# Patient Record
Sex: Female | Born: 1955 | Race: White | Hispanic: No | Marital: Married | Smoking: Current every day smoker
Health system: Southern US, Community
[De-identification: ages and names within clinical notes are randomized; demographics above are authoritative.]

## PROBLEM LIST (undated history)

## (undated) DIAGNOSIS — S335XXA Sprain of ligaments of lumbar spine, initial encounter: Secondary | ICD-10-CM

## (undated) DIAGNOSIS — R202 Paresthesia of skin: Secondary | ICD-10-CM

## (undated) DIAGNOSIS — K56609 Unspecified intestinal obstruction, unspecified as to partial versus complete obstruction: Secondary | ICD-10-CM

## (undated) DIAGNOSIS — F329 Major depressive disorder, single episode, unspecified: Secondary | ICD-10-CM

## (undated) DIAGNOSIS — E079 Disorder of thyroid, unspecified: Secondary | ICD-10-CM

## (undated) DIAGNOSIS — M797 Fibromyalgia: Secondary | ICD-10-CM

## (undated) DIAGNOSIS — F32A Depression, unspecified: Secondary | ICD-10-CM

## (undated) DIAGNOSIS — Z9981 Dependence on supplemental oxygen: Secondary | ICD-10-CM

## (undated) DIAGNOSIS — K219 Gastro-esophageal reflux disease without esophagitis: Secondary | ICD-10-CM

## (undated) DIAGNOSIS — E119 Type 2 diabetes mellitus without complications: Secondary | ICD-10-CM

## (undated) DIAGNOSIS — K5903 Drug induced constipation: Secondary | ICD-10-CM

## (undated) DIAGNOSIS — Z87442 Personal history of urinary calculi: Secondary | ICD-10-CM

## (undated) DIAGNOSIS — Z8659 Personal history of other mental and behavioral disorders: Secondary | ICD-10-CM

## (undated) DIAGNOSIS — J189 Pneumonia, unspecified organism: Secondary | ICD-10-CM

## (undated) DIAGNOSIS — E039 Hypothyroidism, unspecified: Secondary | ICD-10-CM

## (undated) DIAGNOSIS — G629 Polyneuropathy, unspecified: Secondary | ICD-10-CM

## (undated) DIAGNOSIS — I10 Essential (primary) hypertension: Secondary | ICD-10-CM

## (undated) DIAGNOSIS — G43909 Migraine, unspecified, not intractable, without status migrainosus: Secondary | ICD-10-CM

## (undated) DIAGNOSIS — L309 Dermatitis, unspecified: Secondary | ICD-10-CM

## (undated) DIAGNOSIS — K760 Fatty (change of) liver, not elsewhere classified: Secondary | ICD-10-CM

## (undated) DIAGNOSIS — K5792 Diverticulitis of intestine, part unspecified, without perforation or abscess without bleeding: Secondary | ICD-10-CM

## (undated) DIAGNOSIS — J45909 Unspecified asthma, uncomplicated: Secondary | ICD-10-CM

## (undated) DIAGNOSIS — J449 Chronic obstructive pulmonary disease, unspecified: Secondary | ICD-10-CM

## (undated) DIAGNOSIS — F1120 Opioid dependence, uncomplicated: Secondary | ICD-10-CM

## (undated) DIAGNOSIS — E785 Hyperlipidemia, unspecified: Secondary | ICD-10-CM

## (undated) DIAGNOSIS — F419 Anxiety disorder, unspecified: Secondary | ICD-10-CM

## (undated) HISTORY — PX: OTHER SURGICAL HISTORY: SHX169

## (undated) HISTORY — DX: Disorder of thyroid, unspecified: E07.9

## (undated) HISTORY — DX: Diverticulitis of intestine, part unspecified, without perforation or abscess without bleeding: K57.92

## (undated) HISTORY — DX: Type 2 diabetes mellitus without complications: E11.9

## (undated) HISTORY — DX: Unspecified asthma, uncomplicated: J45.909

## (undated) HISTORY — DX: Hyperlipidemia, unspecified: E78.5

## (undated) HISTORY — DX: Morbid (severe) obesity due to excess calories: E66.01

## (undated) HISTORY — DX: Gastro-esophageal reflux disease without esophagitis: K21.9

## (undated) HISTORY — DX: Fibromyalgia: M79.7

## (undated) HISTORY — DX: Sprain of ligaments of lumbar spine, initial encounter: S33.5XXA

## (undated) HISTORY — PX: ABDOMINAL HYSTERECTOMY: SHX81

## (undated) HISTORY — DX: Chronic obstructive pulmonary disease, unspecified: J44.9

## (undated) SURGERY — COLONOSCOPY
Anesthesia: Monitor Anesthesia Care

---
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
## 1998-10-09 ENCOUNTER — Emergency Department (HOSPITAL_COMMUNITY): Admission: EM | Admit: 1998-10-09 | Discharge: 1998-10-09 | Payer: Self-pay | Admitting: Emergency Medicine

## 1998-10-09 ENCOUNTER — Encounter: Payer: Self-pay | Admitting: Emergency Medicine

## 1998-10-26 ENCOUNTER — Encounter: Payer: Self-pay | Admitting: Emergency Medicine

## 1998-10-26 ENCOUNTER — Emergency Department (HOSPITAL_COMMUNITY): Admission: EM | Admit: 1998-10-26 | Discharge: 1998-10-26 | Payer: Self-pay | Admitting: Emergency Medicine

## 1998-10-29 ENCOUNTER — Other Ambulatory Visit: Admission: RE | Admit: 1998-10-29 | Discharge: 1998-10-29 | Payer: Self-pay | Admitting: Obstetrics & Gynecology

## 1998-11-05 ENCOUNTER — Emergency Department (HOSPITAL_COMMUNITY): Admission: EM | Admit: 1998-11-05 | Discharge: 1998-11-05 | Payer: Self-pay | Admitting: Emergency Medicine

## 1998-11-06 ENCOUNTER — Emergency Department (HOSPITAL_COMMUNITY): Admission: EM | Admit: 1998-11-06 | Discharge: 1998-11-06 | Payer: Self-pay | Admitting: Emergency Medicine

## 1999-02-09 ENCOUNTER — Emergency Department (HOSPITAL_COMMUNITY): Admission: EM | Admit: 1999-02-09 | Discharge: 1999-02-10 | Payer: Self-pay | Admitting: Emergency Medicine

## 1999-02-14 ENCOUNTER — Emergency Department (HOSPITAL_COMMUNITY): Admission: EM | Admit: 1999-02-14 | Discharge: 1999-02-14 | Payer: Self-pay | Admitting: Emergency Medicine

## 1999-05-02 ENCOUNTER — Encounter: Payer: Self-pay | Admitting: Emergency Medicine

## 1999-05-02 ENCOUNTER — Emergency Department (HOSPITAL_COMMUNITY): Admission: EM | Admit: 1999-05-02 | Discharge: 1999-05-02 | Payer: Self-pay | Admitting: Emergency Medicine

## 1999-06-08 ENCOUNTER — Encounter: Payer: Self-pay | Admitting: Emergency Medicine

## 1999-06-08 ENCOUNTER — Emergency Department (HOSPITAL_COMMUNITY): Admission: EM | Admit: 1999-06-08 | Discharge: 1999-06-08 | Payer: Self-pay | Admitting: Emergency Medicine

## 1999-08-19 ENCOUNTER — Emergency Department (HOSPITAL_COMMUNITY): Admission: EM | Admit: 1999-08-19 | Discharge: 1999-08-19 | Payer: Self-pay | Admitting: *Deleted

## 1999-10-09 ENCOUNTER — Emergency Department (HOSPITAL_COMMUNITY): Admission: EM | Admit: 1999-10-09 | Discharge: 1999-10-09 | Payer: Self-pay

## 1999-11-20 ENCOUNTER — Other Ambulatory Visit: Admission: RE | Admit: 1999-11-20 | Discharge: 1999-11-20 | Payer: Self-pay | Admitting: Obstetrics and Gynecology

## 2000-02-16 ENCOUNTER — Emergency Department (HOSPITAL_COMMUNITY): Admission: EM | Admit: 2000-02-16 | Discharge: 2000-02-16 | Payer: Self-pay

## 2000-10-08 ENCOUNTER — Emergency Department (HOSPITAL_COMMUNITY): Admission: EM | Admit: 2000-10-08 | Discharge: 2000-10-09 | Payer: Self-pay | Admitting: Emergency Medicine

## 2000-12-16 ENCOUNTER — Inpatient Hospital Stay (HOSPITAL_COMMUNITY): Admission: RE | Admit: 2000-12-16 | Discharge: 2000-12-20 | Payer: Self-pay | Admitting: Obstetrics and Gynecology

## 2000-12-17 ENCOUNTER — Encounter: Payer: Self-pay | Admitting: Obstetrics and Gynecology

## 2000-12-17 ENCOUNTER — Encounter: Payer: Self-pay | Admitting: Pulmonary Disease

## 2000-12-18 ENCOUNTER — Encounter: Payer: Self-pay | Admitting: Pulmonary Disease

## 2000-12-19 ENCOUNTER — Encounter: Payer: Self-pay | Admitting: Critical Care Medicine

## 2000-12-26 ENCOUNTER — Emergency Department (HOSPITAL_COMMUNITY): Admission: EM | Admit: 2000-12-26 | Discharge: 2000-12-26 | Payer: Self-pay | Admitting: *Deleted

## 2001-05-23 ENCOUNTER — Encounter: Payer: Self-pay | Admitting: Emergency Medicine

## 2001-05-23 ENCOUNTER — Emergency Department (HOSPITAL_COMMUNITY): Admission: EM | Admit: 2001-05-23 | Discharge: 2001-05-23 | Payer: Self-pay | Admitting: Emergency Medicine

## 2002-09-11 ENCOUNTER — Inpatient Hospital Stay (HOSPITAL_COMMUNITY): Admission: EM | Admit: 2002-09-11 | Discharge: 2002-09-13 | Payer: Self-pay | Admitting: Emergency Medicine

## 2002-09-11 ENCOUNTER — Encounter: Payer: Self-pay | Admitting: Emergency Medicine

## 2002-09-12 ENCOUNTER — Encounter: Payer: Self-pay | Admitting: Internal Medicine

## 2002-11-02 ENCOUNTER — Encounter: Payer: Self-pay | Admitting: Gastroenterology

## 2002-11-02 ENCOUNTER — Ambulatory Visit (HOSPITAL_COMMUNITY): Admission: RE | Admit: 2002-11-02 | Discharge: 2002-11-02 | Payer: Self-pay | Admitting: Gastroenterology

## 2002-11-09 ENCOUNTER — Ambulatory Visit (HOSPITAL_COMMUNITY): Admission: RE | Admit: 2002-11-09 | Discharge: 2002-11-09 | Payer: Self-pay | Admitting: Gastroenterology

## 2003-01-19 ENCOUNTER — Encounter: Payer: Self-pay | Admitting: Emergency Medicine

## 2003-01-19 ENCOUNTER — Emergency Department (HOSPITAL_COMMUNITY): Admission: EM | Admit: 2003-01-19 | Discharge: 2003-01-19 | Payer: Self-pay | Admitting: Emergency Medicine

## 2003-03-18 ENCOUNTER — Emergency Department (HOSPITAL_COMMUNITY): Admission: EM | Admit: 2003-03-18 | Discharge: 2003-03-18 | Payer: Self-pay | Admitting: Emergency Medicine

## 2003-09-06 ENCOUNTER — Emergency Department (HOSPITAL_COMMUNITY): Admission: EM | Admit: 2003-09-06 | Discharge: 2003-09-06 | Payer: Self-pay | Admitting: Emergency Medicine

## 2003-09-06 ENCOUNTER — Encounter: Payer: Self-pay | Admitting: Emergency Medicine

## 2004-03-21 ENCOUNTER — Emergency Department (HOSPITAL_COMMUNITY): Admission: EM | Admit: 2004-03-21 | Discharge: 2004-03-21 | Payer: Self-pay | Admitting: Emergency Medicine

## 2004-03-22 ENCOUNTER — Ambulatory Visit (HOSPITAL_COMMUNITY): Admission: RE | Admit: 2004-03-22 | Discharge: 2004-03-22 | Payer: Self-pay | Admitting: Emergency Medicine

## 2004-05-26 ENCOUNTER — Emergency Department (HOSPITAL_COMMUNITY): Admission: EM | Admit: 2004-05-26 | Discharge: 2004-05-27 | Payer: Self-pay | Admitting: Emergency Medicine

## 2004-09-01 ENCOUNTER — Emergency Department (HOSPITAL_COMMUNITY): Admission: EM | Admit: 2004-09-01 | Discharge: 2004-09-02 | Payer: Self-pay | Admitting: Emergency Medicine

## 2004-09-11 ENCOUNTER — Encounter: Admission: RE | Admit: 2004-09-11 | Discharge: 2004-09-11 | Payer: Self-pay | Admitting: Neurosurgery

## 2004-12-15 ENCOUNTER — Emergency Department (HOSPITAL_COMMUNITY): Admission: EM | Admit: 2004-12-15 | Discharge: 2004-12-16 | Payer: Self-pay | Admitting: Emergency Medicine

## 2004-12-18 ENCOUNTER — Emergency Department (HOSPITAL_COMMUNITY): Admission: EM | Admit: 2004-12-18 | Discharge: 2004-12-18 | Payer: Self-pay | Admitting: Emergency Medicine

## 2004-12-30 ENCOUNTER — Ambulatory Visit (HOSPITAL_COMMUNITY): Admission: RE | Admit: 2004-12-30 | Discharge: 2004-12-30 | Payer: Self-pay | Admitting: Gastroenterology

## 2005-01-17 ENCOUNTER — Ambulatory Visit: Payer: Self-pay | Admitting: General Surgery

## 2005-10-03 ENCOUNTER — Encounter: Admission: RE | Admit: 2005-10-03 | Discharge: 2005-10-03 | Payer: Self-pay | Admitting: Neurology

## 2007-06-29 ENCOUNTER — Emergency Department (HOSPITAL_COMMUNITY): Admission: EM | Admit: 2007-06-29 | Discharge: 2007-06-29 | Payer: Self-pay | Admitting: Emergency Medicine

## 2008-04-07 ENCOUNTER — Emergency Department (HOSPITAL_COMMUNITY): Admission: EM | Admit: 2008-04-07 | Discharge: 2008-04-07 | Payer: Self-pay | Admitting: Emergency Medicine

## 2009-01-22 ENCOUNTER — Emergency Department (HOSPITAL_COMMUNITY): Admission: EM | Admit: 2009-01-22 | Discharge: 2009-01-22 | Payer: Self-pay | Admitting: Emergency Medicine

## 2009-02-12 ENCOUNTER — Ambulatory Visit: Payer: Self-pay | Admitting: Unknown Physician Specialty

## 2009-03-08 ENCOUNTER — Ambulatory Visit: Payer: Self-pay | Admitting: Unknown Physician Specialty

## 2009-06-23 ENCOUNTER — Encounter: Admission: RE | Admit: 2009-06-23 | Discharge: 2009-06-23 | Payer: Self-pay | Admitting: Chiropractor

## 2009-06-27 ENCOUNTER — Encounter: Admission: RE | Admit: 2009-06-27 | Discharge: 2009-06-27 | Payer: Self-pay | Admitting: Anesthesiology

## 2009-12-04 ENCOUNTER — Ambulatory Visit: Payer: Self-pay | Admitting: Internal Medicine

## 2009-12-04 ENCOUNTER — Inpatient Hospital Stay (HOSPITAL_COMMUNITY): Admission: EM | Admit: 2009-12-04 | Discharge: 2009-12-07 | Payer: Self-pay | Admitting: Emergency Medicine

## 2009-12-19 ENCOUNTER — Encounter: Admission: RE | Admit: 2009-12-19 | Discharge: 2009-12-19 | Payer: Self-pay | Admitting: Gastroenterology

## 2011-02-11 ENCOUNTER — Other Ambulatory Visit (HOSPITAL_COMMUNITY): Payer: Self-pay | Admitting: Gastroenterology

## 2011-02-11 DIAGNOSIS — R1084 Generalized abdominal pain: Secondary | ICD-10-CM

## 2011-02-16 LAB — CBC
HCT: 36.2 % (ref 36.0–46.0)
HCT: 47.1 % — ABNORMAL HIGH (ref 36.0–46.0)
Hemoglobin: 15.7 g/dL — ABNORMAL HIGH (ref 12.0–15.0)
MCHC: 33.3 g/dL (ref 30.0–36.0)
MCHC: 33.4 g/dL (ref 30.0–36.0)
MCHC: 33.7 g/dL (ref 30.0–36.0)
MCV: 88.8 fL (ref 78.0–100.0)
MCV: 89.2 fL (ref 78.0–100.0)
Platelets: 257 10*3/uL (ref 150–400)
Platelets: 280 10*3/uL (ref 150–400)
Platelets: 304 10*3/uL (ref 150–400)
RBC: 4.41 MIL/uL (ref 3.87–5.11)
RBC: 5.3 MIL/uL — ABNORMAL HIGH (ref 3.87–5.11)
RDW: 14.6 % (ref 11.5–15.5)
RDW: 14.6 % (ref 11.5–15.5)
RDW: 14.6 % (ref 11.5–15.5)
WBC: 14.2 10*3/uL — ABNORMAL HIGH (ref 4.0–10.5)
WBC: 20.1 10*3/uL — ABNORMAL HIGH (ref 4.0–10.5)

## 2011-02-16 LAB — BASIC METABOLIC PANEL
BUN: 4 mg/dL — ABNORMAL LOW (ref 6–23)
BUN: 6 mg/dL (ref 6–23)
CO2: 27 mEq/L (ref 19–32)
CO2: 29 mEq/L (ref 19–32)
Calcium: 8.2 mg/dL — ABNORMAL LOW (ref 8.4–10.5)
Calcium: 8.9 mg/dL (ref 8.4–10.5)
Chloride: 101 mEq/L (ref 96–112)
Chloride: 102 mEq/L (ref 96–112)
Creatinine, Ser: 0.6 mg/dL (ref 0.4–1.2)
Creatinine, Ser: 0.65 mg/dL (ref 0.4–1.2)
Creatinine, Ser: 0.67 mg/dL (ref 0.4–1.2)
GFR calc Af Amer: >60 mL/min (ref >60)
Glucose, Bld: 102 mg/dL — ABNORMAL HIGH (ref 70–99)
Glucose, Bld: 114 mg/dL — ABNORMAL HIGH (ref 70–99)
Potassium: 3.4 mEq/L — ABNORMAL LOW (ref 3.5–5.1)

## 2011-02-16 LAB — DIFFERENTIAL
Basophils Absolute: 0.1 10*3/uL (ref 0.0–0.1)
Basophils Relative: 0 % (ref 0–1)
Basophils Relative: 1 % (ref 0–1)
Eosinophils Absolute: 0.1 10*3/uL (ref 0.0–0.7)
Eosinophils Relative: 1 % (ref 0–5)
Monocytes Absolute: 0.9 10*3/uL (ref 0.1–1.0)
Neutro Abs: 8.9 10*3/uL — ABNORMAL HIGH (ref 1.7–7.7)
Neutrophils Relative %: 72 % (ref 43–77)

## 2011-02-16 LAB — COMPREHENSIVE METABOLIC PANEL
ALT: 22 U/L (ref 0–35)
AST: 27 U/L (ref 0–37)
Albumin: 3.6 g/dL (ref 3.5–5.2)
Alkaline Phosphatase: 44 U/L (ref 39–117)
BUN: 7 mg/dL (ref 6–23)
CO2: 30 mEq/L (ref 19–32)
Calcium: 9.8 mg/dL (ref 8.4–10.5)
Chloride: 96 mEq/L (ref 96–112)
Creatinine, Ser: 0.77 mg/dL (ref 0.4–1.2)
GFR calc Af Amer: >60 mL/min (ref >60)
GFR calc non Af Amer: >60 mL/min (ref >60)
Glucose, Bld: 125 mg/dL — ABNORMAL HIGH (ref 70–99)
Potassium: 3.9 mEq/L (ref 3.5–5.1)
Sodium: 138 mEq/L (ref 135–145)
Total Bilirubin: 0.5 mg/dL (ref 0.3–1.2)
Total Protein: 8.4 g/dL — ABNORMAL HIGH (ref 6.0–8.3)

## 2011-02-16 LAB — URINE CULTURE

## 2011-02-16 LAB — CULTURE, BLOOD (ROUTINE X 2): Culture: NO GROWTH

## 2011-02-16 LAB — URINALYSIS, ROUTINE W REFLEX MICROSCOPIC
Bilirubin Urine: NEGATIVE
Hgb urine dipstick: NEGATIVE
Ketones, ur: NEGATIVE mg/dL
Protein, ur: 30 mg/dL — AB
Urobilinogen, UA: 0.2 mg/dL (ref 0.0–1.0)

## 2011-02-16 LAB — URINE MICROSCOPIC-ADD ON

## 2011-02-27 ENCOUNTER — Other Ambulatory Visit (HOSPITAL_COMMUNITY): Payer: Self-pay

## 2011-03-13 ENCOUNTER — Encounter (HOSPITAL_COMMUNITY)
Admission: RE | Admit: 2011-03-13 | Discharge: 2011-03-13 | Disposition: A | Discharge status: Home / Self Care | Payer: 59 | Source: Ambulatory Visit | Attending: Gastroenterology | Admitting: Gastroenterology

## 2011-03-13 DIAGNOSIS — D7389 Other diseases of spleen: Secondary | ICD-10-CM | POA: Insufficient documentation

## 2011-03-13 DIAGNOSIS — R109 Unspecified abdominal pain: Secondary | ICD-10-CM | POA: Insufficient documentation

## 2011-03-13 DIAGNOSIS — R110 Nausea: Secondary | ICD-10-CM | POA: Insufficient documentation

## 2011-03-13 DIAGNOSIS — R1084 Generalized abdominal pain: Secondary | ICD-10-CM

## 2011-03-13 MED ORDER — TECHNETIUM TC 99M MEBROFENIN IV KIT
5.3000 | PACK | Freq: Once | INTRAVENOUS | Status: AC | PRN
  Administered 2011-03-13: 5.3 via INTRAVENOUS

## 2011-03-13 MED ORDER — SINCALIDE 5 MCG IJ SOLR: 0.0200 ug/kg | Freq: Once | INTRAMUSCULAR | Status: DC

## 2011-04-18 NOTE — Op Note (Signed)
   NAME:  Denise Case, Denise Case                         ACCOUNT NO.:  [id number]   MEDICAL RECORD NO.:  [id number]                   PATIENT TYPE:  AMB   LOCATION:  ENDO                                 FACILITY:  [facility]   PHYSICIAN:  Anselmo Rod, M.D.               DATE OF BIRTH:  1956/07/14   DATE OF PROCEDURE:  11/09/2002  DATE OF DISCHARGE:                                 OPERATIVE REPORT   PROCEDURE PERFORMED:  Esophagogastroduodenoscopy.   ENDOSCOPIST:  Charna Elizabeth, M.D.   INSTRUMENT USED:  Olympus video panendoscope.   INDICATIONS FOR PROCEDURE:  [age]-year-old white female with a history  of nausea, vomiting and abdominal pain and unrevealing abdominal ultrasound.  Normal HIDA scan.  Rule out peptic ulcer disease, esophagitis, gastritis,  etc.   PROCEDURE PREPARATION:  Informed consent was procured from the patient.  The  patient was fasted for eight hours prior to the procedure.   PREPROCEDURE PHYSICAL EXAMINATION:  VITAL SIGNS:  The patient has stable  vital signs.  NECK:  Supple.  CHEST:  Clear to auscultation.  HEART:  S1 and S2 regular.  ABDOMEN:  Soft with normal bowel sounds.  The patient has an obese abdomen.  No evidence of hepatosplenomegaly.   DESCRIPTION OF PROCEDURE:  The patient was placed in the left lateral  decubitus position and sedated with 90 mg of Demerol and 9 mg of Versed  intravenously.  Once the patient was adequately sedated and maintained on  low flow oxygen and continuous cardiac monitoring, the Olympus video  panendoscope was advanced through the mouthpiece, over the tongue, into the  esophagus under direct vision.  The entire esophagus appeared normal with no  evidence of ring, stricture, masses or esophagitis or Barrett's mucosa.  The  scope was then advanced into the stomach.  The entire gastric mucosa  appeared normal including the retroflexed view.  No ulcers, erosions, masses  or polyps were seen.  The proximal small bowel including  the duodenal bulb  appeared normal.   IMPRESSION:  1. Normal esophagogastroduodenoscopy.  2. No ulcers or masses.   RECOMMENDATIONS:  1. Continue Aciphex.  2. Librax one p.o. b.i.d. p.r.n.  3.     Low fat diet.  4. Colonoscopy early sometime next year.  5. Outpatient followup in the next three to four weeks.                                               Anselmo Rod, M.D.    JNM/MEDQ  D:  11/09/2002  T:  11/09/2002  Job:  [id number]   cc:   Gabriel Earing, M.D.  [address]  [place]  [place] [address]  Fax: [phone]

## 2011-04-18 NOTE — Discharge Summary (Signed)
[facility]  Patient:    Denise Case, Denise Case                      MRN: [id number] Adm. Date:  [id number] Disc. Date: [id number] Attending:  Carmelina Peal CC:         Claudette Laws, M.D.  Barbaraann Share, M.D. [facility]   Discharge Summary  ADMISSION DIAGNOSES: 1. Cystocele. 2. Rectocele. 3. Perineal defect. 4. Stress urinary incontinence.  DISCHARGE DIAGNOSES: 1. Cystocele. 2. Rectocele. 3. Perineal defect. 4. Stress urinary incontinence. 5. Respiratory distress.  PROCEDURES: 1. Anterior and posterior colporrhaphy. 2. Pubovaginal sling.  CONSULTATIONS: 1. Pulmonary, Dr. Shelle Iron. 2. Urology, Dr. Etta Grandchild.  HISTORY OF PRESENT ILLNESS:  Briefly, this is a 55 year old white female, gravida 3, para 3-0-0-3, with a previous total abdominal hysterectomy, bilateral salpingo-oophorectomy, and Burch who is on Premarin 1.25 mg a day for hormone replacement therapy.  She has urine leak with strain, has a grade 2 cystocele, grade 1 rectocele, grade 2 perineal defect of stress urinary incontinence, and is admitted for surgical therapy.  PAST MEDICAL HISTORY:  Three vaginal deliveries.  PAST SURGICAL HISTORY:  Above mentioned hysterectomy.  ALLERGIES:  CODEINE, DOXYCYCLINE.  MEDICATIONS:  Premarin.  SOCIAL HISTORY:  The patient smokes at least 1-1/2 packs of cigarettes a day.  PHYSICAL EXAMINATION:  Significant for a weight of 249 pounds.  Abdomen is obese, but benign.  Pelvic exam reveals a grade 2 cystocele, grade 1 rectocele, grade 2 perineal defect, and no pelvic masses.  HOSPITAL COURSE:  The patient was admitted on the day of surgery and underwent the above mentioned procedures.  This was done under general anesthesia, and she had no complications.  She initially did very well, was rapidly able to ambulate and tolerate a regular diet.  On the afternoon of postoperative day #1, which was December 17, 2000, I saw the patient and had discharged her  home. However, shortly after I wrote this, she began to experience dyspnea, and was evaluated by Dr. Ambrose Mantle.  She was found to be hypoxic and a pulmonary consult was obtained by Dr. Shelle Iron.  She had a spiral CT scan to rule out pulmonary embolism, and this was negative.  It revealed only bilateral atelectasis.  She was then essentially managed by the pulmonary team for her hypoxia, and was kept on oxygen.  She also eventually leaked around her suprapubic catheter which required manipulation, and I believe eventual replacement.  She was put on empiric Zosyn to cover for possible pneumonia.  Her oxygen status did eventually improve with aggressive pulmonary toilet.  On the morning of December 20, 2000, she was felt by the pulmonary team to be stable for discharge home.  She did require discharge on oxygen.  Prior to discharge, per Dr. Carroll Kinds orders, she had a Foley catheter inserted, and her suprapubic was clamped due to the drainage around it.  CONDITION ON DISCHARGE:  Stable and improved.  DISPOSITION:  Discharged to home.  DIET:  Regular.  ACTIVITY:  No strenuous activity and pelvic rest and no driving.  FOLLOWUP: 1. In two weeks with me. 2. In approximately two days with Dr. Etta Grandchild. 3. In approximately one week with Dr. Shelle Iron.  DISCHARGE MEDICATIONS: 1. Cipro 250 mg b.i.d. x 5 days. 2. Serevent 2 puffs b.i.d. 3. Oxygen. DD:  01/09/01 TD:  01/11/01 Job: [id number]

## 2011-04-18 NOTE — H&P (Signed)
[facility]  Patient:    Denise Case, Denise Case                        MRN: [id number] Attending:  Zenaida Niece, M.D.                         History and Physical  CHIEF COMPLAINT:  Pelvic relaxation and urinary incontinence.  HISTORY OF PRESENT ILLNESS:  This is a 55 year old white female, gravida 3, para 3-0-0-3 whom I saw for her annual exam on November 12, 2000. She has a previous TAH/BSO and Burch procedure and is taking Premarin 1.25 mg a day for hormone replacement therapy. She complains of leaking urine with strain and still has an occasional loss of flatus. Physical exam revealed a grade 2 cystocele, a grade 1 rectocele and a grade 2 perineal defect.  She has been evaluated by Dr. Mickel Crow and has been determined to have stress urinary incontinence. She desires surgical therapy and is admitted for this at this time.  PAST OB HISTORY:  Three vaginal deliveries at term without complications.  PAST MEDICAL HISTORY:  None.  PAST SURGICAL HISTORY:  The above mentioned hysterectomy, BSO, and Burch.  ALLERGIES:  CODEINE and DOXYCYCLINE.  CURRENT MEDICATIONS:  Premarin 1.25 mg a day.  SOCIAL HISTORY:  The patient is married and smokes a pack and 1/2 of cigarettes a day. She denies alcohol abuse.  FAMILY HISTORY:  Maternal grandmother and maternal aunt with breast caner.  REVIEW OF SYSTEMS:  Significant for anxiety and stress which has improved since her daughter-in-law moved out.  PHYSICAL EXAMINATION:  VITAL SIGNS:  Weight is 249 pounds, blood pressure was 114/80, pulse 76.  GENERAL:  She is an obese white female who is in no acute distress.  HEENT:  Pupils equal round and reactive to light and accommodation. Extraocular movements intact. Oropharynx is clear without erythema or exudates.  NECK:  Supple without lymphadenopathy or thyromegaly.  LUNGS:  Clear to auscultation.  HEART:  Regular rate and rhythm without murmur.  BREASTS:   Examined in the sitting and supine position reveal no dominant masses, adenopathy, skin change or nipple discharge.  ABDOMEN:  Obese, nontender without palpable masses.  EXTREMITIES:  Have mild edema, are nontender and DTRs are 2/4 and symmetric.  PELVIC:  External genitalia reveals no lesions. The vaginal exam reveals a grade 2 cystocele, a grade 1 rectocele and a grade 2 perineal defect. On bimanual and rectovaginal exams, there are no masses and she is nontender.  ASSESSMENT:  Pelvic relaxation with cystocele, rectocele and stress urinary incontinence. The patient has seen Dr. Etta Grandchild for this as well. The patient desires definitive surgical therapy.  PLAN:  Admit the patient for anterior and posterior colporrhaphy and Dr. Etta Grandchild is to perform a pubovaginal sling. We will continue her on her Premarin 1.25 mg q.d. which she says she needs by 8:30 a.m. or she has significant symptoms throughout the day. DD:  12/15/00 TD:  12/15/00 Job: [id number]

## 2011-04-18 NOTE — Op Note (Signed)
[facility]  Patient:    Denise Case, Denise Case                      MRN: [id number] Proc. Date: 12/16/00 Adm. Date:  [id number] Attending:  Michaele Offer                           Operative Report  PREOPERATIVE DIAGNOSES: 1. Cystocele. 2. Rectocele. 3. Perineal defect.  POSTOPERATIVE DIAGNOSES: 1. Cystocele. 2. Rectocele. 3. Perineal defect.  PROCEDURES:  Anterior and posterior colporrhaphy, with perineorrhaphy included in the posterior colporrhaphy.  SURGEON:  Zenaida Niece, M.D.  ASSISTANT:  Claudette Laws, M.D.  ANESTHESIA:  General endotracheal tube.  ESTIMATED BLOOD LOSS:  150 cc.  FINDINGS:  Grade 2 cystocele.  Grade 1 rectocele.  No enterocele.  Grade 2 perineal defect.  She had good reduction with surgery.  COUNTS:  Correct.  CONDITION:  Stable.  PROCEDURE IN DETAIL:  After appropriate informed consent was obtained, the patient was taken into the operating room and placed in the dorsosupine position.  General anesthesia was induced, and she was placed in mobile stirrups.  Her lower abdomen, perineum and vagina were prepped and draped in the usual sterile fashion.  Her bladder was drained with a red rubber catheter.  A weighted speculum was inserted into the vagina and a Deaver retractor used anteriorly.  The vaginal apex was grasped with Allis clamps, and a piece of vaginal mucosa was removed with the scissors.  The vaginal mucosa was dissected in the midline, from the vaginal apex to within 1-2 cm of the urethral meatus.  This dissection was then carried out laterally, sharply and bluntly to mobilize the cystocele.  Once I had mobilized this adequately, Dr. Etta Grandchild entered the field and placed his pubovaginal sling, which he will dictate separately.  Once the sling was in place, 2-0 Vicryl was used to reduce the remainder of the cystocele, with good reduction.  Bleeding was then controlled with electrocautery.  Excess  vaginal mucosa was removed sharply and the vagina was reapproximated with running, locking 2-0 Vicryl from the urethral meatus to the vaginal apex.  Attention was then turned to the posterior colporrhaphy.  A diamond-shaped piece of tissue was removed, including the distal vaginal mucosa and perineal skin.  The vaginal mucosa was then dissected up to the vaginal apex in the midline.  It was then dissected laterally to mobilize the rectocele.  A finger was inserted into the rectum, and the rectal mucosa appeared to be intact.  I was also not able to palpate an enterocele.  The rectocele was then reduced with interrupted sutures of 2-0 Vicryl, with good reduction.  The bulbocavernous muscles were also reapproximated in the midline with 2-0 Vicryl.  Excess vaginal mucosa was then removed sharply.  The vagina was then closed with running, locking 2-0 Vicryl to the hymenal ring.  From the hymenal ring inferiorly with the perineal skin, this was closed with running 3-0 Vicryl.  This allowed passage easily of two fingers into the vagina and achieved good reduction of the rectocele.  Rectal examination was again performed and the rectum was intact; there did appear to be good reduction of the rectocele.  The vagina was irrigated and found to have adequate hemostasis.  The vagina was then packed with 2 inch Iodoform gauze.  A Foley catheter was also placed.  The patient was taken down from stirrups.  She  was extubated in the operating room and tolerated the procedure well, and was taken to the recovery room in stable condition. DD:  12/16/00 TD:  12/17/00 Job: [id number]

## 2011-04-18 NOTE — Op Note (Signed)
[facility]  Patient:    Denise Case, Denise Case                      MRN: [id number] Proc. Date: 12/16/00 Adm. Date:  [id number] Attending:  Michaele Offer                           Operative Report  PREOPERATIVE DIAGNOSIS: 1. Stress urinary incontinence. 2. Status post Burch procedure in 1995.  POSTOPERATIVE DIAGNOSIS: 1. Stress urinary incontinence. 2. Status post Burch procedure in 1995.  OPERATION PERFORMED:  Pubovaginal sling procedure and cystoscopy.  SURGEON:  Claudette Laws, M.D.  ASSISTANT:  Zenaida Niece, M.D.  ANESTHESIA:  INDICATIONS FOR PROCEDURE:  The patient is a 55 year old lady who had an abdominal hysterectomy and a Burch procedure for stress incontinence approximately seven years ago.  Since then, she has developed pelvic floor prolapse along with recurrent stress incontinence.  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 Jackelyn Knife will repair at the same time.  DESCRIPTION OF PROCEDURE:  Dr. Jackelyn Knife initiated the cystocele repair and then I entered the operative field and broke through the urethropelvic ligament bilaterally with sharp and blunt dissection.  As expected, she was quite scarred.  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  Having developed plane retrosymphysis, I then went superpubically and made about a 2 to 3 cm incision right over the pubic bone and carried the dissection down to the rectus fascia.  All bleeders were electrocoagulated.  Then using a Raz needle, this was passed superpubically under fingertip control out the vaginal incision.  Freeze-dried Lifenet allograft sling was formed by initially binding the ends with 2-0 Vicryl and then each end was secured with a #1 Prolene suture.  The sling was brought into the vaginal area and the ends of the Prolene were threaded through the eye of the Raz needle and brought up  superpubically.  This was done bilaterally.  Cystoscopic examination after injecting the patient with IV indigo carmine showed a normal bladder.  No inadvertent puncture of the bladder and efflux of contrast from each ureteral orifice.  With the patient in Trendelenburg position and a full bladder, a #14 [place] fader tip Microvasive catheter was passed superpubically under direct vision. It was positioned in the bladder and secured to the skin with 3-0 nylon.  With the bladder full of irrigating solution, the sling was then tied down.  A right angle clamp was placed beneath the urethra and the sling so as not to coapt the urethra too tightly.  Once the sling had been tied down to itself and across the midline, at the end I still could express fluid from the urethra.  The wound was irrigated with copious amounts of bug juice and then I assisted Dr. Jackelyn Knife with the rest of the cystocele and posterior repair. DD:  12/16/00 TD:  12/18/00 Job: [id number]

## 2011-04-18 NOTE — Discharge Summary (Signed)
NAME:  Denise Case, Denise Case                         ACCOUNT NO.:  [id number]   MEDICAL RECORD NO.:  [id number]                   PATIENT TYPE:  INP   LOCATION:  0160                                 FACILITY:  [facility]   PHYSICIAN:  Jackie Plum, M.D.             DATE OF BIRTH:  Sep 30, 1956   DATE OF ADMISSION:  09/11/2002  DATE OF DISCHARGE:  09/13/2002                                 DISCHARGE SUMMARY   PRIMARY CARE PHYSICIAN:  [facility].   DISCHARGE DIAGNOSES:  1. Angioedema of the head and neck, resolved.  Etiology likely medication     effect.  2. Oral candidiasis.   DISCHARGE MEDICATIONS:  1. Combivent meter dosed inhaler 2 puffs q.i.d.  2. Augmentin 875 mg one p.o. b.i.d. x5 days.  3. Prednisone 10 mg taper 40 mg x2 days, 30 mg x2 days, 20 mg x2 days, 10 mg     x2 days.  4. Pepcid 20 mg b.i.d. x9 days.  5. Magic mouthwash one teaspoon q.i.d.   ALLERGIES:  DOXYCYCLINE causes her tongue to peel.  We will now also assume  allergy to SINGULAR.   PROCEDURE:  Fiberoptic laryngoscopy performed by Dr. Lucky Cowboy.   HISTORY OF PRESENT ILLNESS:  The patient presented to the emergency  department complaining of shortness of breath with wheezing and throat pain  and some difficulty swallowing.  Myalgia x1 day.  The patient has been ill  since 08/30/02, with bronchitis, treated with Levaquin and an anti-tussive  with little improvement, later treated with steroids.  The patient took two  doses of Singular prior to her presentation in the ED, as prescribed to her  by [facility].  Despite the above treatment, she continued to have shortness  of breath and diffuse myalgias x24 hours prior to her admission.  Twenty-  four hours prior to her admission symptoms were predominately directed at  her neck.  She describes swelling with pain and affecting her inability to  swallow.  New medications include Levaquin, steroids, and Singular.  She has  not had any recent dental procedures, no tooth  extractions, no history of  dental abscess, no throat trauma.  Has had some sick contacts.   HOSPITAL COURSE:  #1 -  ANGIOEDEMA OF THE FACE AND HEAD:  The patient  presented to the ER with respiratory distress.  Was placed on supplemental  O2.  Strep cultures were sent which were negative.  CT scan of the neck  revealed no abnormalities.  Bedside fiberoptic laryngoscopy performed by Dr.  Lucky Cowboy revealed moderate boggy edema of lingual tonsils as well as edema  and erythema without exudate of adenoid area tissue, post-nasopharyngeal  wall.  Also, normal epiglottis and the endolarynx with mild bilateral cord  edema.  Airway was patent with normal movement of cords.  Dr. Gerilyn Pilgrim felt  this was consistent with possible early angioedema, and recommended the use  of Protonix during  her stay.  The patient was admitted to intensive care,  treated with IV fluid, Pepcid, Solu-Medrol, Benadryl, Rocephin, clindamycin,  and Tylenol.  Throat and blood cultures were sent as mentioned above.  At  the time of discharge, these revealed no growth, but final reports are  pending.  The patient was also provided with hand held nebulizer treatments  of albuterol and Atrovent, and continuous pulse oximetry monitoring.  The  patient's condition improved over the course of her admission with the above  noted treatments, and she will be discharged on the medications as noted  above to follow up with Dr. Gerilyn Pilgrim in three to four weeks for a re-evaluation  of her airway, at which time further studies will be considered, including a  barium swallow and/or esophageal manometry.  ENT consult did not recommend  any restrictions at the time of discharge in terms of diet or activity.  #2 -  ORAL CANDIDIASIS:  The patient was on Mycelex troche prescribed for  her by [facility] for oral candidiasis at the time of admission.  She was  kept on Magic mouthwash while she was here, and will be discharged home with  same to  complete a seven day course.  She can follow up with her primary  care physician if she has any further concerns related to her candidiasis.   CONSULTATIONS:  Dr. Lucky Cowboy for ENT.   LABORATORY DATA:  White blood cell count on 09/12/02, was 23.1, this was  thought to be secondary to pulse steroid use.  Other labs noted during her  admission:  Sodium 136, potassium 4.1, BUN 10, creatinine 0.9.  Cultures as  noted above.  Also RSV is negative.  Group A Strep negative.   CONDITION ON DISCHARGE:  Improved.  Stable.   DISPOSITION:  Discharged to home.   FOLLOWUP:  1. She is to see her physician at [facility] in the next two weeks.  2. She will follow up with Dr. Lucky Cowboy in the next three to four weeks     for airway evaluation.     Ellender Hose. Christian Mate, M.D.    SMD/MEDQ  D:  09/13/2002  T:  09/13/2002  Job:  [id number]   cc:   Lucky Cowboy, MD  [phone] [place]  [place] [address]  Fax: [phone]

## 2011-09-15 LAB — CBC
HCT: 41.8
Hemoglobin: 14.3
MCHC: 34.3
MCV: 87.9
Platelets: 344
RBC: 4.76
RDW: 15.2 — ABNORMAL HIGH
WBC: 17.8 — ABNORMAL HIGH

## 2011-09-15 LAB — URINALYSIS, ROUTINE W REFLEX MICROSCOPIC
Bilirubin Urine: NEGATIVE
Glucose, UA: NEGATIVE
Ketones, ur: NEGATIVE
Nitrite: POSITIVE — AB
Protein, ur: NEGATIVE
Specific Gravity, Urine: 1.015
Urobilinogen, UA: 0.2
pH: 5.5

## 2011-09-15 LAB — URINE MICROSCOPIC-ADD ON

## 2011-09-15 LAB — BASIC METABOLIC PANEL
CO2: 24
Calcium: 9.2
Creatinine, Ser: 0.59
GFR calc Af Amer: >60
GFR calc non Af Amer: >60

## 2011-09-15 LAB — BASIC METABOLIC PANEL WITH GFR
BUN: 4 — ABNORMAL LOW
Chloride: 101
Glucose, Bld: 104 — ABNORMAL HIGH
Potassium: 3.8
Sodium: 135

## 2011-09-15 LAB — URINE CULTURE: Colony Count: >=100000

## 2011-09-15 LAB — DIFFERENTIAL
Basophils Absolute: 0.1
Basophils Relative: 1
Eosinophils Absolute: 0.5
Eosinophils Relative: 3
Lymphocytes Relative: 28
Lymphs Abs: 5 — ABNORMAL HIGH
Monocytes Absolute: 0.9 — ABNORMAL HIGH
Monocytes Relative: 5
Neutro Abs: 11.2 — ABNORMAL HIGH
Neutrophils Relative %: 63

## 2011-09-15 LAB — D-DIMER, QUANTITATIVE (NOT AT ARMC): D-Dimer, Quant: <0.22

## 2012-05-27 ENCOUNTER — Ambulatory Visit
Admission: RE | Admit: 2012-05-27 | Discharge: 2012-05-27 | Disposition: A | Discharge status: Home / Self Care | Payer: 59 | Source: Ambulatory Visit | Attending: Family Medicine | Admitting: Family Medicine

## 2012-05-27 ENCOUNTER — Other Ambulatory Visit: Payer: Self-pay | Admitting: Family Medicine

## 2012-05-27 DIAGNOSIS — M549 Dorsalgia, unspecified: Secondary | ICD-10-CM

## 2012-07-30 ENCOUNTER — Ambulatory Visit: Payer: 59 | Admitting: *Deleted

## 2012-08-20 ENCOUNTER — Encounter: Payer: 59 | Attending: Family Medicine | Admitting: *Deleted

## 2012-08-20 ENCOUNTER — Encounter: Payer: Self-pay | Admitting: *Deleted

## 2012-08-20 DIAGNOSIS — R7309 Other abnormal glucose: Secondary | ICD-10-CM | POA: Insufficient documentation

## 2012-08-20 DIAGNOSIS — E669 Obesity, unspecified: Secondary | ICD-10-CM | POA: Insufficient documentation

## 2012-08-20 DIAGNOSIS — Z713 Dietary counseling and surveillance: Secondary | ICD-10-CM | POA: Insufficient documentation

## 2012-08-20 NOTE — Patient Instructions (Addendum)
Plan: Aim for 2 Carb Choices per meal (30 grams) Continue with 3 meals and snacks as needed Consider reading food labels for total carbohydrate of foods Consider ways to increase activity level such as walking with your cane or Arm Chair Exercises or pool if available

## 2012-08-20 NOTE — Progress Notes (Signed)
  Medical Nutrition Therapy:  Appt start time: 1000 end time:  1100.  Assessment:  Primary concerns today: obesity and pre-diabetes. Husband here with her and appears very supportive. She does not work, he works 2nd shift for [employer]. She relates many stressors including intense pain, multiple family members that cause her stress, loss of the ability to do many things she has done in the past due to the decline of her health and loss of independence such as driving due to her health. She states she does not want to hurt herself, but prays for God to take her due to the pain she is constantly in.  MEDICATIONS: see list   DIETARY INTAKE:  Usual eating pattern includes 3 meals and 2-3 snacks per day.  Everyday foods include easily prepared foods she is able to tolerate.  Avoided foods include foods that take much time to prepare.    24-hr recall:  B ( AM): egg OR yogurt, diet caffeine free Mountain Dew or Coffee with non-dairy creamer  Snk ( AM): banana if available OR yogurt due to medications  L ( PM): salad OR sandwich OR yogurt OR skip  Snk (2-3 PM): yogurt D ( PM): Grapenuts or Bran Flakes cereal with 2 % milk OR 2 pkgs flavored oatmeal with Diet Mountain Dew Snk ( PM): none Beverages: coffee,  diet caffeine free [email or web address]-evans.org/,   Usual physical activity: walks in stores as able with pain and difficulty focusing  Estimated energy needs: 1200 calories 135 g carbohydrates 90 g protein 33 g fat  Progress Towards Goal(s):  In progress.   Nutritional Diagnosis:  NI-1.5 Excessive energy intake As related to activity level.  As evidenced by BMI of 54.4.    Intervention:  Nutrition counseling and brief description of diabetes provided. Discussed briefly the basic physiology of diabetes, SMBG and rationale of checking BG at alternate times of day once MD feels it is warranted, A1c, Carb Counting and reading food labels, and benefits of increased activity. Due to her multiple medical  problems including extreme pain, we discussed use of Arm Chair exercises that don't involve her standing on her feet.  Plan: Aim for 2 Carb Choices per meal (30 grams) Continue with 3 meals and snacks as needed Consider reading food labels for total carbohydrate of foods Consider ways to increase activity level such as walking with your cane or Arm Chair Exercises or pool if available  Handouts given during visit include: Living Well with Diabetes Carb Counting and Food Label handouts Meal Plan Card  Monitoring/Evaluation:  Dietary intake, exercise, reading food labels, and body weight in 6 week(s). Patient to call to make this appointment.

## 2013-05-03 ENCOUNTER — Other Ambulatory Visit: Payer: Self-pay | Admitting: Gastroenterology

## 2013-05-03 DIAGNOSIS — R1011 Right upper quadrant pain: Secondary | ICD-10-CM

## 2013-05-06 ENCOUNTER — Encounter: Payer: Self-pay | Admitting: *Deleted

## 2013-05-06 ENCOUNTER — Encounter: Payer: 59 | Attending: Family Medicine | Admitting: *Deleted

## 2013-05-06 DIAGNOSIS — Z713 Dietary counseling and surveillance: Secondary | ICD-10-CM | POA: Insufficient documentation

## 2013-05-06 DIAGNOSIS — E669 Obesity, unspecified: Secondary | ICD-10-CM | POA: Insufficient documentation

## 2013-05-06 DIAGNOSIS — E119 Type 2 diabetes mellitus without complications: Secondary | ICD-10-CM | POA: Insufficient documentation

## 2013-05-06 NOTE — Progress Notes (Signed)
  Medical Nutrition Therapy:  Appt start time: 1000 end time:  1100.  Assessment:  Primary concerns today: obesity and diabetes on referral form. Husband here with her and appears very supportive. She does not work, he works 2nd shift for [employer]. She states she is on 3 different narcotics that make her sleepy, so she is afraid to cook when husband not home in case she falls asleep and could start a fire. So most meals include cereal, salad and yogurt type foods, no vegetables unless her husband is home and he works 2nd shift. They have a new in ground pool, she was in it 3 times last week. States after about 30 minutes her pain went away! She states she has never been told she has diabetes and does not have a meter to check her BG. Weight loss of 14 pounds since last September when I saw her last was noted!  MEDICATIONS: see list   DIETARY INTAKE:  Usual eating pattern includes 3 meals and 2-3 snacks per day.  Everyday foods include easily prepared foods she is able to tolerate.  Avoided foods include foods that take much time to prepare, fried foods anymore.    24-hr recall:  B ( AM): boiled egg OR yogurt, OR High Fiber cereal, OR cinnamon raison toast with PNB and honey, diet caffeine free Mountain Dew or Coffee with non-dairy creamer  Snk ( AM): banana if available OR yogurt due to medications  L ( PM): salad OR sandwich OR yogurt OR skips usually  Snk (2-3 PM): yogurt D ( PM): Grapenuts or Bran Flakes cereal with 2 % milk OR 2 pkgs flavored oatmeal with Diet Mountain Dew Snk ( PM): none Beverages: coffee,  diet caffeine free [address],   Usual physical activity: walks in stores as able with pain and difficulty focusing. Also getting in pool as able.  Estimated energy needs: 1200 calories 135 g carbohydrates 90 g protein 33 g fat  Progress Towards Goal(s):  In progress.   Nutritional Diagnosis:  NI-1.5 Excessive energy intake As related to activity level.  As evidenced by  BMI of 54.4 which is now reduced to 51.1    Intervention: Nutrition counseling and diabetes education continued. Discussed rationale for SMBG and suggested that she consider checking BG at alternate times of day if MD writes Rx for meter and strips. Also reviewed  A1c, Carb Counting and reading food labels. Spent rest of visit explaining multiple benefits of increased activity and ways to include on a daily basis.  Plan: Continue to aim for 2-3 Carb Choices per meal (30-45 grams) +/- 1 either way Continue with 3 meals and snacks as needed Consider reading food labels for total carbohydrate of foods Consider use of frozen vegetables heated in microwave  Consider cooking extra servings of meat when cooking and freeze extra to be microwaved another day. Continue with pool as tolerated!  Ask MD about obtaining a meter so you can have access to your BG control yourself.   Handouts given during visit include: Living Well with Diabetes Carb Counting and Food Label handouts Meal Plan Card  Monitoring/Evaluation:  Dietary intake, exercise, reading food labels, and body weight in 6 week(s).

## 2013-05-06 NOTE — Patient Instructions (Signed)
Plan: Continue to aim for 2-3 Carb Choices per meal (30-45 grams) +/- 1 either way Continue with 3 meals and snacks as needed Consider reading food labels for total carbohydrate of foods Consider use of frozen vegetables heated in microwave  Consider cooking extra servings of meat when cooking and freeze extra to be microwaved another day. Continue with pool as tolerated!  Ask MD about obtaining a meter so you can have access to your BG control yourself.

## 2013-05-17 ENCOUNTER — Ambulatory Visit (HOSPITAL_COMMUNITY)
Admission: RE | Admit: 2013-05-17 | Discharge: 2013-05-17 | Disposition: A | Discharge status: Home / Self Care | Payer: 59 | Source: Ambulatory Visit | Attending: Gastroenterology | Admitting: Gastroenterology

## 2013-05-17 DIAGNOSIS — R1011 Right upper quadrant pain: Secondary | ICD-10-CM | POA: Insufficient documentation

## 2013-05-17 DIAGNOSIS — R110 Nausea: Secondary | ICD-10-CM | POA: Insufficient documentation

## 2013-05-17 MED ORDER — TECHNETIUM TC 99M MEBROFENIN IV KIT
5.1000 | PACK | Freq: Once | INTRAVENOUS | Status: AC | PRN
  Administered 2013-05-17: 5 via INTRAVENOUS

## 2013-06-06 ENCOUNTER — Ambulatory Visit (INDEPENDENT_AMBULATORY_CARE_PROVIDER_SITE_OTHER): Payer: Self-pay | Admitting: General Surgery

## 2013-06-17 ENCOUNTER — Encounter: Payer: Self-pay | Admitting: *Deleted

## 2013-06-17 ENCOUNTER — Encounter: Payer: 59 | Attending: Family Medicine | Admitting: *Deleted

## 2013-06-17 VITALS — Ht 66.0 in | Wt 313.0 lb

## 2013-06-17 DIAGNOSIS — Z713 Dietary counseling and surveillance: Secondary | ICD-10-CM | POA: Insufficient documentation

## 2013-06-17 DIAGNOSIS — E669 Obesity, unspecified: Secondary | ICD-10-CM | POA: Insufficient documentation

## 2013-06-17 DIAGNOSIS — E119 Type 2 diabetes mellitus without complications: Secondary | ICD-10-CM | POA: Insufficient documentation

## 2013-06-17 NOTE — Patient Instructions (Signed)
Plan: Continue to aim for 2-3 Carb Choices per meal (30-45 grams) +/- 1 either way Continue with 3 meals and snacks as needed Consider reading food labels for total carbohydrate of foods Continue use of frozen vegetables heated in microwave  Consider cooking extra servings of meat when cooking and freeze extra to be microwaved another day. Continue with pool as tolerated!  Consider Arm Chair exercises on days you can't get in the pool as tolerated Continue checking your BG twice a week, alternating between pre and post meal

## 2013-06-17 NOTE — Progress Notes (Signed)
  Medical Nutrition Therapy:  Appt start time: 1030 end time:  1100.  Assessment:  Primary concerns today: obesity and diabetes on referral form. Weight down 3 pounds, patient discouraged as she states she had lost 6 and gained 3 back. Pool being cleaned, so no swimming for past week. Otherwise, she was swimming every other day considering days of rain. SMBG, she states she has tested a couple of times in past week with results between 100-110 mg/dl before any food. She states they are measuring her portion sizes now, eating more vegetables, has decreased fried foods to only shrimp, and is checking food labels for Total Carbohydrate of foods.  MEDICATIONS: see list   DIETARY INTAKE:  Usual eating pattern includes 3 meals and 2-3 snacks per day.  Everyday foods include easily prepared foods she is able to tolerate.  Avoided foods include foods that take much time to prepare, fried foods anymore.    24-hr recall:  B ( AM): just choosing yogurt now with diet caffeine free Mountain Dew or Coffee with non-dairy creamer  Snk ( AM): banana if available OR yogurt due to medications  L (2 PM): salad OR sandwich OR yogurt OR Now buying microwave meals without any sauces to limit carb and sodium content if husband is home Snk (2-3 PM):none D ( PM):  Cereal usually, husband is at work, along with fresh fruit, with Diet Mountain Dew Snk ( PM): none Beverages: coffee,  diet caffeine free Mountain Dew, decaffeinated green tea  Usual physical activity: walks in stores as able with pain and difficulty focusing. Also getting in pool every other day as able.  Estimated energy needs: 1200 calories 135 g carbohydrates 90 g protein 33 g fat  Progress Towards Goal(s):  In progress.   Nutritional Diagnosis:  NI-1.5 Excessive energy intake As related to activity level.  As evidenced by BMI of 54.4 which is now reduced to 51.1, now decreased to 50.6    Intervention: Nutrition counseling and diabetes  education continued. Commended her for SMBG and suggested that she consider checking BG at alternate times of day. Also reviewed  A1c, Carb Counting and reading food labels. Spent some time encouraging her to acknowledge all the positive changes she is making in her eating habits and activity level and to consider giving herself credit for these rather than focusing on what she feels she is doing wrong.   Plan: Continue to aim for 2-3 Carb Choices per meal (30-45 grams) +/- 1 either way Continue with 3 meals and snacks as needed Consider reading food labels for total carbohydrate of foods Continue use of frozen vegetables heated in microwave  Consider cooking extra servings of meat when cooking and freeze extra to be microwaved another day. Continue with pool as tolerated!  Consider Arm Chair exercises on days you can't get in the pool as tolerated Continue checking your BG twice a week, alternating between pre and post meal   Handouts given during visit include: Patient to keep list of positive behaviors and bring to next visit  Monitoring/Evaluation:  Dietary intake, exercise, reading food labels, and body weight in 4 week(s).

## 2013-06-20 ENCOUNTER — Ambulatory Visit (INDEPENDENT_AMBULATORY_CARE_PROVIDER_SITE_OTHER): Payer: 59 | Admitting: General Surgery

## 2013-06-20 ENCOUNTER — Encounter (INDEPENDENT_AMBULATORY_CARE_PROVIDER_SITE_OTHER): Payer: Self-pay | Admitting: General Surgery

## 2013-06-20 ENCOUNTER — Other Ambulatory Visit (INDEPENDENT_AMBULATORY_CARE_PROVIDER_SITE_OTHER): Payer: Self-pay | Admitting: General Surgery

## 2013-06-20 VITALS — BP 142/80 | HR 96 | Resp 18 | Ht 66.0 in | Wt 313.0 lb

## 2013-06-20 DIAGNOSIS — R1011 Right upper quadrant pain: Secondary | ICD-10-CM

## 2013-06-20 NOTE — Patient Instructions (Signed)
Stay on a lowfat diet.  We will call you with the ultrasound result.

## 2013-06-20 NOTE — Progress Notes (Signed)
Patient ID: Denise Case, female   DOB: 08-06-1956, 57 y.o.   MRN: [id number]  Chief Complaint  Patient presents with  . New Evaluation    eval GB    HPI Denise Case is a 57 y.o. female.   HPI  She is referred by Dr. Loreta Ave to discuss cholecystectomy. She describes having some right upper quadrant pains that radiate across the epigastrium to the left upper quadrant as well as nausea and vomiting and reflux. She's been started on Dexilant and she reports this has helped her symptoms.  She had an ultrasound performed April 2012 which did not show any gallstones. She had a nuclear medicine hepatobiliary scan done at [facility] that demonstrated a gallbladder ejection fraction of 45%. She had a nuclear medicine hepatobiliary scan and [facility] in 2012 which demonstrated a gallbladder ejection fraction of 32%. She had a recent nuclear medicine hepatobiliary scan which demonstrated a gallbladder ejection fraction of 77%.  When she's fried foods, she tends to get the symptoms more often. If she is careful with her diet she is not as symptomatic. There is a family history of gallbladder disease.  Past Medical History  Diagnosis Date  . COPD (chronic obstructive pulmonary disease)   . Fibromyalgia   . Thyroid disease   . Morbid obesity   . GERD (gastroesophageal reflux disease)   . Hyperlipidemia   . Asthma   . Diverticulitis   . Lumbar sprain   . Diabetes mellitus without complication     Past Surgical History  Procedure Laterality Date  . Bladder tack    . Abdominal hysterectomy      History reviewed. No pertinent family history.  Social History History  Substance Use Topics  . Smoking status: Current Every Day Smoker -- 1.00 packs/day    Types: Cigarettes  . Smokeless tobacco: Never Used  . Alcohol Use: No    Allergies  Allergen Reactions  . Montelukast Sodium Swelling  . Dicyclomine Other (See Comments)    Strips lining off of top of tongue, like thrush  .  Hydrocodone-Acetaminophen     Current Outpatient Prescriptions  Medication Sig Dispense Refill  . ALPRAZolam (XANAX) 1 MG tablet Take 1 mg by mouth at bedtime as needed.      . Blood Glucose Monitoring Suppl (TRUETRACK BLOOD GLUCOSE) W/DEVICE KIT       . calcium carbonate (OS-CAL) 600 MG TABS Take 600 mg by mouth 2 (two) times daily with a meal.      . Cinnamon 500 MG capsule Take 500 mg by mouth 2 (two) times daily.      . cyanocobalamin 1000 MCG tablet Take 100 mcg by mouth daily.      . cyclobenzaprine (FLEXERIL) 10 MG tablet Take 10 mg by mouth 3 (three) times daily as needed.      Marland Kitchen dexlansoprazole (DEXILANT) 60 MG capsule Take 60 mg by mouth daily.      . ergocalciferol (VITAMIN D2) 50000 UNITS capsule Take 50,000 Units by mouth once a week.      . estrogens, conjugated, (PREMARIN) 0.625 MG tablet Take 0.625 mg by mouth daily. Take daily for 21 days then do not take for 7 days.      . fish oil-omega-3 fatty acids 1000 MG capsule Take 2 g by mouth daily.      . flurazepam (DALMANE) 30 MG capsule Take 30 mg by mouth at bedtime as needed.      . gabapentin (NEURONTIN) 300 MG capsule       .  Garlic 1000 MG CAPS Take by mouth.      . levothyroxine (SYNTHROID) 200 MCG tablet Take 200 mcg by mouth daily.      . methocarbamol (ROBAXIN) 750 MG tablet Take 750 mg by mouth 4 (four) times daily.      . Multiple Vitamin (MULTIVITAMIN) tablet Take 1 tablet by mouth daily.      . naproxen (NAPROSYN) 375 MG tablet Take 375 mg by mouth 3 (three) times daily with meals.      . polyethylene glycol powder (GLYCOLAX/MIRALAX) powder       . promethazine (PHENERGAN) 25 MG tablet       . Red Yeast Rice Extract ([employer] RED YEAST RICE) 600 MG CAPS Take by mouth.      . Tapentadol HCl (NUCYNTA ER) 100 MG TB12 Take by mouth.      . theophylline (THEO-24) 300 MG 24 hr capsule Take 300 mg by mouth daily.      Gilman Schmidt TEST test strip       . vitamin C (ASCORBIC ACID) 500 MG tablet Take 500 mg by mouth daily.       Marland Kitchen amitriptyline (ELAVIL) 25 MG tablet Take 25 mg by mouth at bedtime.       No current facility-administered medications for this visit.    Review of Systems Review of Systems  Constitutional: Negative.   Cardiovascular: Positive for leg swelling.  Gastrointestinal: Positive for nausea, abdominal pain, constipation and abdominal distention.  Neurological: Positive for headaches.    Blood pressure 142/80, pulse 96, resp. rate 18, height 5\' 6"  (1.676 m), weight 313 lb (141.976 kg).  Physical Exam Physical Exam  Constitutional: No distress.  Morbidly obese female.  HENT:  Head: Normocephalic and atraumatic.  Cardiovascular: Normal rate.   Abdominal: Soft. She exhibits no distension and no mass. There is tenderness (mild in RUQ).  GER  Musculoskeletal: She exhibits no edema.  Neurological: She is alert.  Skin: Skin is warm and dry.  Psychiatric: She has a normal mood and affect. Her behavior is normal.    Data Reviewed X-ray reports.  Dr. Kenna Gilbert note.  Assessment    Right upper quadrant and epigastric pain with some nausea and vomiting. Chills has reflux symptoms. Her symptoms are are improved after taking Dexilant.  Ultrasound 2 years ago did not show gallstones. Nuclear medicine hepatobiliary scan recently demonstrates a gallbladder ejection fraction of 77% when 2 years ago was 32%. These results are discord ant.     Plan    We discussed laparoscopic cholecystectomy but told her I could not give her a good estimate of the success rate given her hepatobiliary scan results as well as relief of many of her symptoms with Dexilant.  I have explained the procedure, risks, and aftercare of cholecystectomy.  Risks include but are not limited to bleeding, infection, wound problems, anesthesia, diarrhea, bile leak, injury to common bile duct/liver/intestine.    She's going to go home and think about her options. I told her to stay on a low-fat diet in the Dexilant.  If she has  breakthrough symptoms and would like to proceed with cholecystectomy I have asked her call me back.        Jairon Ripberger J 06/20/2013, 5:58 PM

## 2013-06-22 ENCOUNTER — Telehealth (INDEPENDENT_AMBULATORY_CARE_PROVIDER_SITE_OTHER): Payer: Self-pay | Admitting: General Surgery

## 2013-06-22 NOTE — Telephone Encounter (Signed)
Spoke with pt and informed her that her [place] is on 06/28/13 at 9:00 at [address].  NPO after midnight

## 2013-06-28 ENCOUNTER — Ambulatory Visit
Admission: RE | Admit: 2013-06-28 | Discharge: 2013-06-28 | Disposition: A | Discharge status: Home / Self Care | Payer: 59 | Source: Ambulatory Visit | Attending: General Surgery | Admitting: General Surgery

## 2013-06-28 DIAGNOSIS — R1011 Right upper quadrant pain: Secondary | ICD-10-CM

## 2013-06-30 ENCOUNTER — Telehealth (INDEPENDENT_AMBULATORY_CARE_PROVIDER_SITE_OTHER): Payer: Self-pay

## 2013-06-30 NOTE — Telephone Encounter (Signed)
Correction.  Normal [place] not CT scan.  Copies were sent to Dr. Aida Puffer and Dr. Charna Elizabeth.

## 2013-06-30 NOTE — Telephone Encounter (Signed)
Discussed pt's CT results which were normal - no gallstones.  She said Dr. Loreta Ave has prescribed Dexilant, and since she starting taking it she has had no symptoms of RUQ pain or nausea.  I told her if the pain returns she should contact Dr. Loreta Ave first.  If Dr. Loreta Ave thinks she needs to come back and see Dr. Abbey Chatters she will let her know.  Pt agreed with POC.

## 2013-07-04 NOTE — Telephone Encounter (Signed)
Noted  

## 2013-07-18 ENCOUNTER — Ambulatory Visit: Payer: 59 | Admitting: *Deleted

## 2013-08-30 ENCOUNTER — Encounter (INDEPENDENT_AMBULATORY_CARE_PROVIDER_SITE_OTHER): Payer: Self-pay

## 2013-09-05 ENCOUNTER — Encounter: Payer: Self-pay | Admitting: Podiatry

## 2013-09-05 ENCOUNTER — Ambulatory Visit (INDEPENDENT_AMBULATORY_CARE_PROVIDER_SITE_OTHER): Payer: 59 | Admitting: Podiatry

## 2013-09-05 VITALS — BP 132/64 | HR 84 | Resp 16 | Ht 66.0 in | Wt 307.0 lb

## 2013-09-05 DIAGNOSIS — L03039 Cellulitis of unspecified toe: Secondary | ICD-10-CM

## 2013-09-05 NOTE — Patient Instructions (Addendum)

## 2013-09-05 NOTE — Progress Notes (Signed)
N- sore L- RT. TOENAIL D-3 DAYS O-SUDDENLY C- WORSE A- PRESSURE T- NEOSPORIN Patient states she does not remember the injury.  Objective findings: Distal pulses intact. Neurological diminished but no change. Inflamed right hallux lateral border with mild to moderate necrotic tissue. No other change in health status.  Assessment: Paronychia right hallux secondary to trauma localized in nature.  Plan: H&P performed. Infiltrated 60 mg of Xylocaine Marcaine mixture. Exposed the lateral border and removed paronychia and nailbed on the lateral side. There prided necrotic tissue and flushed. Allowed channel for drainage and applied sterile dressing. Begin soaks call if any problems should occur. Encouraged daily inspections.

## 2013-12-01 DIAGNOSIS — J189 Pneumonia, unspecified organism: Secondary | ICD-10-CM

## 2013-12-01 HISTORY — DX: Pneumonia, unspecified organism: J18.9

## 2014-04-06 ENCOUNTER — Encounter (INDEPENDENT_AMBULATORY_CARE_PROVIDER_SITE_OTHER): Payer: Self-pay | Admitting: General Surgery

## 2014-04-06 ENCOUNTER — Other Ambulatory Visit (INDEPENDENT_AMBULATORY_CARE_PROVIDER_SITE_OTHER): Payer: Self-pay | Admitting: General Surgery

## 2014-04-06 ENCOUNTER — Ambulatory Visit (INDEPENDENT_AMBULATORY_CARE_PROVIDER_SITE_OTHER): Payer: 59 | Admitting: General Surgery

## 2014-04-06 VITALS — BP 132/82 | HR 84 | Temp 97.0°F | Resp 14 | Wt 304.2 lb

## 2014-04-06 DIAGNOSIS — R112 Nausea with vomiting, unspecified: Secondary | ICD-10-CM

## 2014-04-06 DIAGNOSIS — R1011 Right upper quadrant pain: Secondary | ICD-10-CM

## 2014-04-06 NOTE — Patient Instructions (Signed)
Strict lowfat to nonfat diet.   [facility] ______[facility] LAPAROSCOPIC SURGERY: POST OP INSTRUCTIONS Always review your discharge instruction sheet given to you by the facility where your surgery was performed. IF YOU HAVE DISABILITY OR FAMILY LEAVE FORMS, YOU MUST BRING THEM TO THE OFFICE FOR PROCESSING.   DO NOT GIVE THEM TO YOUR DOCTOR.  1. A prescription for pain medication may be given to you upon discharge.  Take your pain medication as prescribed, if needed.  If narcotic pain medicine is not needed, then you may take acetaminophen (Tylenol) or ibuprofen (Advil) as needed. 2. Take your usually prescribed medications unless otherwise directed. 3. If you need a refill on your pain medication, please contact your pharmacy.  They will contact our office to request authorization. Prescriptions will not be filled after 5pm or on week-ends. 4. You should follow a light diet the first few days after arrival home, such as soup and crackers, etc.  Be sure to include lots of fluids daily. 5. Most patients will experience some swelling and bruising in the area of the incisions.  Ice packs will help.  Swelling and bruising can take several days to resolve.  6. It is common to experience some constipation if taking pain medication after surgery.  Increasing fluid intake and taking a stool softener (such as Colace) will usually help or prevent this problem from occurring.  A mild laxative (Milk of Magnesia or Miralax) should be taken according to package instructions if there are no bowel movements after 48 hours. 7. Unless discharge instructions indicate otherwise, you may remove your bandages 24-48 hours after surgery, and you may shower at that time.  You may have steri-strips (small skin tapes) in place directly over the incision.  These strips should be left on the skin for 7-10 days.  If your surgeon used skin glue on the incision, you may shower in 24 hours.  The glue will flake off over  the next 2-3 weeks.  Any sutures or staples will be removed at the office during your follow-up visit. 8. ACTIVITIES:  You may resume regular (light) daily activities beginning the next day-such as daily self-care, walking, climbing stairs-gradually increasing activities as tolerated.  You may have sexual intercourse when it is comfortable.  Refrain from any heavy lifting or straining until approved by your doctor. a. You may drive when you are no longer taking prescription pain medication, you can comfortably wear a seatbelt, and you can safely maneuver your car and apply brakes. b. RETURN TO WORK:  __________________________________________________________ 9. You should see your doctor in the office for a follow-up appointment approximately 2-3 weeks after your surgery.  Make sure that you call for this appointment within a day or two after you arrive home to insure a convenient appointment time. 10. OTHER INSTRUCTIONS: __________________________________________________________________________________________________________________________ __________________________________________________________________________________________________________________________ WHEN TO CALL YOUR DOCTOR: 1. Fever over 101.0 2. Inability to urinate 3. Continued bleeding from incision. 4. Increased pain, redness, or drainage from the incision. 5. Increasing abdominal pain  The clinic staff is available to answer your questions during regular business hours.  Please don't hesitate to call and ask to speak to one of the nurses for clinical concerns.  If you have a medical emergency, go to the nearest emergency room or call 911.  A surgeon from [facility] is always on call at the hospital. [address] [place], [place], [place]  [address] ? [address], [place], [place]   [phone] ?  [phone] ? FAX [phone] Web site: [email or web address]

## 2014-04-06 NOTE — Progress Notes (Signed)
Patient ID: Denise Case, female   DOB: Aug 02, 1956, 58 y.o.   MRN: [id number]  Chief Complaint  Patient presents with  . Follow-up    LTFU/recurrent nausea/RUQpain    HPI Denise Case is a 58 y.o. female.   HPI  She is sent back to see [place] by Dr. Collene Mares. I saw her 10 months ago. She had right upper quadrant pain and had normal gallbladder studies. At that time we talked about cholecystectomy but she decided not to proceed with it. She is on a proton pump inhibitor which was helping some of her symptoms. Her symptoms have now returned and the are present daily. Sometimes the pain is associated with nausea and vomiting. She vomits  food she has just eaten. He is also having some diarrhea. She would like to reconsider the surgery.  She is here with her husband.  Past Medical History  Diagnosis Date  . COPD (chronic obstructive pulmonary disease)   . Fibromyalgia   . Thyroid disease   . Morbid obesity   . GERD (gastroesophageal reflux disease)   . Hyperlipidemia   . Asthma   . Diverticulitis   . Lumbar sprain   . Diabetes mellitus without complication     Past Surgical History  Procedure Laterality Date  . Bladder tack    . Abdominal hysterectomy      History reviewed. No pertinent family history.  Social History History  Substance Use Topics  . Smoking status: Current Every Day Smoker -- 1.00 packs/day    Types: Cigarettes  . Smokeless tobacco: Never Used  . Alcohol Use: No    Allergies  Allergen Reactions  . Montelukast Sodium Swelling  . Dicyclomine Other (See Comments)    Strips lining off of top of tongue, like thrush  . Hydrocodone-Acetaminophen     Current Outpatient Prescriptions  Medication Sig Dispense Refill  . ALPRAZolam (XANAX) 1 MG tablet Take 1 mg by mouth at bedtime as needed.      Marland Kitchen amitriptyline (ELAVIL) 25 MG tablet Take 25 mg by mouth at bedtime.      . Blood Glucose Monitoring Suppl (TRUETRACK BLOOD GLUCOSE) W/DEVICE KIT       . calcium  carbonate (OS-CAL) 600 MG TABS Take 600 mg by mouth 2 (two) times daily with a meal.      . Cinnamon 500 MG capsule Take 500 mg by mouth 2 (two) times daily.      . cyclobenzaprine (FLEXERIL) 10 MG tablet Take 10 mg by mouth 3 (three) times daily as needed.      Marland Kitchen dexlansoprazole (DEXILANT) 60 MG capsule Take 60 mg by mouth daily.      . ergocalciferol (VITAMIN D2) 50000 UNITS capsule Take 50,000 Units by mouth once a week.      . estrogens, conjugated, (PREMARIN) 0.625 MG tablet Take 0.625 mg by mouth daily. Take daily for 21 days then do not take for 7 days.      . fish oil-omega-3 fatty acids 1000 MG capsule Take 2 g by mouth daily.      . flurazepam (DALMANE) 30 MG capsule Take 30 mg by mouth at bedtime as needed.      . gabapentin (NEURONTIN) 300 MG capsule 800 mg 3 (three) times daily.       . Garlic 7017 MG CAPS Take by mouth.      . levothyroxine (SYNTHROID) 200 MCG tablet Take 200 mcg by mouth daily.      . methocarbamol (ROBAXIN) 750  MG tablet Take 750 mg by mouth 4 (four) times daily.      . Multiple Vitamin (MULTIVITAMIN) tablet Take 1 tablet by mouth daily.      . naproxen (NAPROSYN) 375 MG tablet Take 375 mg by mouth 3 (three) times daily with meals.      . polyethylene glycol powder (GLYCOLAX/MIRALAX) powder       . promethazine (PHENERGAN) 25 MG tablet       . Red Yeast Rice Extract ([employer] RED YEAST RICE) 600 MG CAPS Take by mouth.      . Tapentadol HCl (NUCYNTA ER) 100 MG TB12 Take by mouth.      . theophylline (Denise-24) 300 MG 24 hr capsule Take 300 mg by mouth daily.      Angelia Mould TEST test strip       . vitamin C (ASCORBIC ACID) 500 MG tablet Take 500 mg by mouth daily.      . cyanocobalamin 1000 MCG tablet Take 100 mcg by mouth daily.       No current facility-administered medications for this visit.    Review of Systems Review of Systems  Constitutional: Negative for fever and chills.  Gastrointestinal: Positive for nausea, vomiting, abdominal pain and diarrhea.  Negative for blood in stool.  Genitourinary: Negative for hematuria.  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  Hematological: Does not bruise/bleed easily.    Blood pressure 132/82, pulse 84, temperature 97 F (36.1 C), temperature source Temporal, resp. rate 14, weight 304 lb 3.2 oz (137.984 kg).  Physical Exam Physical Exam  Constitutional:  Morbidly obese female in no acute distress.  HENT:  Head: Normocephalic and atraumatic.  Eyes: No scleral icterus.  Abdominal: Soft. There is tenderness.  obese  Neurological: She is alert.  Skin: Skin is warm and dry.    Data Reviewed Previous note. Previous studies.  Assessment    Persistent right upper quadrant pain it is no longer helped by her proton pump inhibitor. Gallbladder studies in the past have been normal. She has not had a gastric emptying scan. She is interested in proceeding with cholecystectomy. She understands that she still may have her symptoms after cholecystectomy.     Plan    We'll perform her gastric emptying scan. We'll schedule laparoscopic cholecystectomy which we can cancel if the gastric emptying scan is significantly abnormal.  I have explained the procedure, risks, and aftercare of cholecystectomy.  Risks include but are not limited to bleeding, infection, wound problems, anesthesia, diarrhea, bile leak, injury to common bile duct/liver/intestine.  She seems to understand and agrees to proceed.         Rhunette Croft  04/06/2014, 11:30 AM

## 2014-04-07 ENCOUNTER — Ambulatory Visit (HOSPITAL_COMMUNITY)
Admission: RE | Admit: 2014-04-07 | Discharge: 2014-04-07 | Disposition: A | Discharge status: Home / Self Care | Payer: 59 | Source: Ambulatory Visit | Attending: General Surgery | Admitting: General Surgery

## 2014-04-07 ENCOUNTER — Telehealth (INDEPENDENT_AMBULATORY_CARE_PROVIDER_SITE_OTHER): Payer: Self-pay

## 2014-04-07 DIAGNOSIS — R109 Unspecified abdominal pain: Secondary | ICD-10-CM | POA: Insufficient documentation

## 2014-04-07 DIAGNOSIS — R112 Nausea with vomiting, unspecified: Secondary | ICD-10-CM

## 2014-04-07 MED ORDER — TECHNETIUM TC 99M SULFUR COLLOID
2.0000 | Freq: Once | INTRAVENOUS | Status: AC | PRN
  Administered 2014-04-07: 2 via INTRAVENOUS

## 2014-04-07 NOTE — Telephone Encounter (Signed)
Pt made aware her gastric emptying scan was normal.  Will continue with gallbladder surgery.

## 2014-04-19 ENCOUNTER — Encounter (HOSPITAL_COMMUNITY): Payer: Self-pay | Admitting: Pharmacy Technician

## 2014-04-26 ENCOUNTER — Encounter (HOSPITAL_COMMUNITY): Payer: Self-pay

## 2014-04-26 ENCOUNTER — Encounter (HOSPITAL_COMMUNITY)
Admission: RE | Admit: 2014-04-26 | Discharge: 2014-04-26 | Disposition: A | Discharge status: Home / Self Care | Payer: 59 | Source: Ambulatory Visit | Attending: General Surgery | Admitting: General Surgery

## 2014-04-26 DIAGNOSIS — Z01812 Encounter for preprocedural laboratory examination: Secondary | ICD-10-CM | POA: Diagnosis present

## 2014-04-26 DIAGNOSIS — J449 Chronic obstructive pulmonary disease, unspecified: Secondary | ICD-10-CM | POA: Insufficient documentation

## 2014-04-26 DIAGNOSIS — Z01810 Encounter for preprocedural cardiovascular examination: Secondary | ICD-10-CM | POA: Diagnosis not present

## 2014-04-26 DIAGNOSIS — J4489 Other specified chronic obstructive pulmonary disease: Secondary | ICD-10-CM | POA: Insufficient documentation

## 2014-04-26 DIAGNOSIS — Z01818 Encounter for other preprocedural examination: Secondary | ICD-10-CM | POA: Insufficient documentation

## 2014-04-26 HISTORY — DX: Hypothyroidism, unspecified: E03.9

## 2014-04-26 HISTORY — DX: Pneumonia, unspecified organism: J18.9

## 2014-04-26 HISTORY — DX: Anxiety disorder, unspecified: F41.9

## 2014-04-26 HISTORY — DX: Depression, unspecified: F32.A

## 2014-04-26 HISTORY — DX: Dependence on supplemental oxygen: Z99.81

## 2014-04-26 HISTORY — DX: Personal history of urinary calculi: Z87.442

## 2014-04-26 HISTORY — DX: Drug induced constipation: K59.03

## 2014-04-26 HISTORY — DX: Major depressive disorder, single episode, unspecified: F32.9

## 2014-04-26 HISTORY — DX: Migraine, unspecified, not intractable, without status migrainosus: G43.909

## 2014-04-26 LAB — COMPREHENSIVE METABOLIC PANEL
ALT: 23 U/L (ref 0–35)
AST: 24 U/L (ref 0–37)
Albumin: 3.8 g/dL (ref 3.5–5.2)
Alkaline Phosphatase: 56 U/L (ref 39–117)
BILIRUBIN TOTAL: 0.3 mg/dL (ref 0.3–1.2)
BUN: 10 mg/dL (ref 6–23)
CO2: 23 mEq/L (ref 19–32)
CREATININE: 0.65 mg/dL (ref 0.50–1.10)
Calcium: 10.1 mg/dL (ref 8.4–10.5)
Chloride: 101 mEq/L (ref 96–112)
GFR calc non Af Amer: >90 mL/min (ref >90)
GLUCOSE: 113 mg/dL — AB (ref 70–99)
POTASSIUM: 4.5 meq/L (ref 3.7–5.3)
Sodium: 141 mEq/L (ref 137–147)
TOTAL PROTEIN: 8.1 g/dL (ref 6.0–8.3)

## 2014-04-26 LAB — CBC WITH DIFFERENTIAL/PLATELET
Basophils Absolute: 0.1 10*3/uL (ref 0.0–0.1)
Basophils Relative: 0 % (ref 0–1)
EOS ABS: 0.3 10*3/uL (ref 0.0–0.7)
Eosinophils Relative: 3 % (ref 0–5)
HCT: 46.6 % — ABNORMAL HIGH (ref 36.0–46.0)
HEMOGLOBIN: 15.2 g/dL — AB (ref 12.0–15.0)
LYMPHS ABS: 3.9 10*3/uL (ref 0.7–4.0)
Lymphocytes Relative: 34 % (ref 12–46)
MCH: 30.6 pg (ref 26.0–34.0)
MCHC: 32.6 g/dL (ref 30.0–36.0)
MCV: 94 fL (ref 78.0–100.0)
MONO ABS: 1 10*3/uL (ref 0.1–1.0)
MONOS PCT: 8 % (ref 3–12)
NEUTROS PCT: 55 % (ref 43–77)
Neutro Abs: 6.3 10*3/uL (ref 1.7–7.7)
Platelets: 266 10*3/uL (ref 150–400)
RBC: 4.96 MIL/uL (ref 3.87–5.11)
RDW: 13.9 % (ref 11.5–15.5)
WBC: 11.5 10*3/uL — ABNORMAL HIGH (ref 4.0–10.5)

## 2014-04-26 LAB — PROTIME-INR
INR: 0.96 (ref 0.00–1.49)
PROTHROMBIN TIME: 12.6 s (ref 11.6–15.2)

## 2014-04-26 NOTE — Progress Notes (Signed)
04/26/14 1007  OBSTRUCTIVE SLEEP APNEA  Have you ever been diagnosed with sleep apnea through a sleep study? No  Do you snore loudly (loud enough to be heard through closed doors)?  0  Do you often feel tired, fatigued, or sleepy during the daytime? 1  Has anyone observed you stop breathing during your sleep? 0  Do you have, or are you being treated for high blood pressure? 0  BMI more than 35 kg/m2? 1  Age over 56 years old? 1  Neck circumference greater than 40 cm/16 inches? 1  Gender: 0  Obstructive Sleep Apnea Score 4  Score 4 or greater  Results sent to PCP   This patient has screened at risk for sleep apnea using the STOP Bang tool during a pre-surgical visit. A score of 4 or greater is at risk for sleep apnea.

## 2014-04-26 NOTE — Progress Notes (Signed)
PCP is Jeneen Rinks Little. Patient denied having a stress test, cardiac cath, or sleep study. Sleep apnea results sent to PCP.

## 2014-04-27 ENCOUNTER — Other Ambulatory Visit (INDEPENDENT_AMBULATORY_CARE_PROVIDER_SITE_OTHER): Payer: Self-pay | Admitting: General Surgery

## 2014-04-27 ENCOUNTER — Telehealth (INDEPENDENT_AMBULATORY_CARE_PROVIDER_SITE_OTHER): Payer: Self-pay

## 2014-04-27 ENCOUNTER — Encounter (INDEPENDENT_AMBULATORY_CARE_PROVIDER_SITE_OTHER): Payer: Self-pay | Admitting: General Surgery

## 2014-04-27 DIAGNOSIS — J189 Pneumonia, unspecified organism: Secondary | ICD-10-CM

## 2014-04-27 NOTE — Progress Notes (Signed)
Patient ID: Denise Case, female   DOB: Mar 17, 1956, 58 y.o.   MRN: [id number] I reviewed her preoperative tests a day. Chest x-ray demonstrates a right lower lobe infiltrate consistent with pneumonia. White blood cell count is also slightly elevated. She tells me she has had a cough for about 2 days. Also have some diarrhea. I spoke with her primary care physician, Dr. Rex Kras, who will see her and start her on treatment for the pneumonia. I told her we needed to cancel her surgery for this upcoming Monday. I want to see her back in one to 2 weeks and repeat her chest x-ray just to make sure the pulmonary process is cleared before we reschedule her operation.

## 2014-04-27 NOTE — Telephone Encounter (Signed)
LMOV for pt to call.  She is scheduled to see Dr. Zella Richer on 05/04/14 at 3:50 pm for f/u of chest x-ray.  Ask pt to have her x-ray at [facility] the day of her appt.  Order is in [place].

## 2014-05-01 ENCOUNTER — Ambulatory Visit (HOSPITAL_COMMUNITY): Admission: RE | Admit: 2014-05-01 | Payer: 59 | Source: Ambulatory Visit | Admitting: General Surgery

## 2014-05-01 ENCOUNTER — Encounter (HOSPITAL_COMMUNITY): Admission: RE | Payer: Self-pay | Source: Ambulatory Visit

## 2014-05-01 SURGERY — LAPAROSCOPIC CHOLECYSTECTOMY WITH INTRAOPERATIVE CHOLANGIOGRAM
Anesthesia: General

## 2014-05-01 NOTE — Telephone Encounter (Signed)
Spoke with pt and let him know of the information below.

## 2014-05-04 ENCOUNTER — Encounter (INDEPENDENT_AMBULATORY_CARE_PROVIDER_SITE_OTHER): Payer: Self-pay | Admitting: General Surgery

## 2014-05-04 ENCOUNTER — Ambulatory Visit (INDEPENDENT_AMBULATORY_CARE_PROVIDER_SITE_OTHER): Payer: 59 | Admitting: General Surgery

## 2014-05-04 ENCOUNTER — Ambulatory Visit
Admission: RE | Admit: 2014-05-04 | Discharge: 2014-05-04 | Disposition: A | Discharge status: Home / Self Care | Payer: 59 | Source: Ambulatory Visit | Attending: General Surgery | Admitting: General Surgery

## 2014-05-04 VITALS — BP 116/62 | HR 64 | Temp 98.6°F | Resp 18 | Wt 305.4 lb

## 2014-05-04 DIAGNOSIS — J189 Pneumonia, unspecified organism: Secondary | ICD-10-CM

## 2014-05-04 MED ORDER — ONDANSETRON HCL 4 MG PO TABS: 4.0000 mg | ORAL_TABLET | ORAL | Status: DC | PRN

## 2014-05-04 NOTE — Patient Instructions (Signed)
Take antibiotics until they are gone.

## 2014-05-04 NOTE — Progress Notes (Signed)
Patient ID: Denise Case, female   DOB: 04/28/1956, 57 y.o.   MRN: [id number]  Chief Complaint  Patient presents with  . Routine Post Op    f/u chest xray for PNA    HPI Denise Case is a 57 y.o. female.   HPI  She is here for followup of her community-acquired pneumonia. We had to cancel her elective cholecystectomy 3 days ago because of an infiltrate and a cough on chest x-ray. She has been started on doxycycline. Chest x-ray done today shows a resolving infiltrate in the right lower lobe.  The doxycycline is making her nauseated.  Past Medical History  Diagnosis Date  . COPD (chronic obstructive pulmonary disease)   . Fibromyalgia   . Thyroid disease   . Morbid obesity   . GERD (gastroesophageal reflux disease)   . Hyperlipidemia   . Asthma   . Diverticulitis   . Lumbar sprain   . Diabetes mellitus without complication   . Pneumonia 2015  . Hypothyroidism   . Migraine   . Depression   . Anxiety   . History of kidney stones   . Constipation due to pain medication   . Requires supplemental oxygen     PRN uses 1.5 Liters    Past Surgical History  Procedure Laterality Date  . Bladder tack      X 2  . Abdominal hysterectomy      History reviewed. No pertinent family history.  Social History History  Substance Use Topics  . Smoking status: Current Every Day Smoker -- 1.00 packs/day for 37 years    Types: Cigarettes  . Smokeless tobacco: Never Used  . Alcohol Use: No    Allergies  Allergen Reactions  . Montelukast Sodium Anaphylaxis and Swelling  . Doxycycline Other (See Comments)    Strips lining off of top of tongue, like thrush  . Hydrocodone-Acetaminophen Other (See Comments)    Migraines     Current Outpatient Prescriptions  Medication Sig Dispense Refill  . doxycycline (VIBRA-TABS) 100 MG tablet Take 100 mg by mouth 2 (two) times daily.      . ALPRAZolam (XANAX) 1 MG tablet Take 1 mg by mouth 3 (three) times daily as needed for anxiety.       .  amitriptyline (ELAVIL) 25 MG tablet Take 25 mg by mouth at bedtime.      . CALCIUM-VITAMIN D PO Take 1 tablet by mouth daily.      . Cinnamon 500 MG capsule Take 500 mg by mouth 2 (two) times daily.      . cyanocobalamin 1000 MCG tablet Take 1,000 mcg by mouth daily.       . dexlansoprazole (DEXILANT) 60 MG capsule Take 60 mg by mouth daily.      . ergocalciferol (VITAMIN D2) 50000 UNITS capsule Take 50,000 Units by mouth once a week. Friday      . fish oil-omega-3 fatty acids 1000 MG capsule Take 1 g by mouth 3 (three) times daily with meals.       . flurazepam (DALMANE) 30 MG capsule Take 30 mg by mouth at bedtime.       . gabapentin (NEURONTIN) 800 MG tablet Take 400-800 mg by mouth 2 (two) times daily. 0.5 tablet (400 mg) in the morning and then 0.5 tablet (400 mg) 8 hours later and 1 tablet (800mg) at bedtime.      . Garlic 1000 MG CAPS Take 1,000 mg by mouth daily.       .   levothyroxine (SYNTHROID) 200 MCG tablet Take 200 mcg by mouth daily before breakfast.       . methocarbamol (ROBAXIN) 750 MG tablet Take 750 mg by mouth 3 (three) times daily. 6 am,2 pm,10 pm      . mineral oil liquid Take 15-30 mLs by mouth at bedtime. Constipation      . ondansetron (ZOFRAN) 4 MG tablet Take 1 tablet (4 mg total) by mouth every 4 (four) hours as needed for nausea or vomiting.  30 tablet  0  . OVER THE COUNTER MEDICATION Take 1 tablet by mouth 2 (two) times daily with a meal. Garcinia cambogia (weight loss supplement)      . Red Yeast Rice Extract (RED YEAST RICE PO) Take 1,000 mg by mouth daily before breakfast.      . Tapentadol HCl (NUCYNTA ER) 100 MG TB12 Take 100 mg by mouth 3 (three) times daily.       . theophylline (THEO-24) 300 MG 24 hr capsule Take 300 mg by mouth 2 (two) times daily.       . vitamin C (ASCORBIC ACID) 500 MG tablet Take 500 mg by mouth daily.      . Zinc 50 MG TABS Take 50 mg by mouth daily.       No current facility-administered medications for this visit.    Review of  Systems Review of Systems  Constitutional: Negative for fever and chills.  Gastrointestinal: Positive for nausea and abdominal pain.    Blood pressure 116/62, pulse 64, temperature 98.6 F (37 C), temperature source Oral, resp. rate 18, weight 305 lb 6.4 oz (138.529 kg).  Physical Exam Physical Exam  Constitutional: No distress.  Obese female  Pulmonary/Chest: Effort normal and breath sounds normal. No respiratory distress. She has no wheezes. She has no rales.    Data Reviewed Chest x-ray from today  Assessment    #1. Chronic cholecystitis-symptomatic  2. Community-acquired pneumonia-improving on oral antibiotic. She has 8-9 more days and then the buttocks. She's having some problems with nausea due to the antibiotics.     Plan    Will give her a prescription for Zofran for nausea. Will schedule her surgery once the antibiotics have been completed.        Markas Aldredge J Yobana Culliton 05/04/2014, 5:03 PM    

## 2014-05-12 ENCOUNTER — Encounter (HOSPITAL_COMMUNITY): Payer: Self-pay | Admitting: Pharmacy Technician

## 2014-05-17 ENCOUNTER — Encounter (HOSPITAL_COMMUNITY)
Admission: RE | Admit: 2014-05-17 | Discharge: 2014-05-17 | Disposition: A | Discharge status: Home / Self Care | Payer: 59 | Source: Ambulatory Visit | Attending: General Surgery | Admitting: General Surgery

## 2014-05-17 ENCOUNTER — Encounter (INDEPENDENT_AMBULATORY_CARE_PROVIDER_SITE_OTHER): Payer: Self-pay

## 2014-05-17 ENCOUNTER — Ambulatory Visit (HOSPITAL_COMMUNITY)
Admission: RE | Admit: 2014-05-17 | Discharge: 2014-05-17 | Disposition: A | Discharge status: Home / Self Care | Payer: 59 | Source: Ambulatory Visit | Attending: General Surgery | Admitting: General Surgery

## 2014-05-17 ENCOUNTER — Telehealth (INDEPENDENT_AMBULATORY_CARE_PROVIDER_SITE_OTHER): Payer: Self-pay

## 2014-05-17 ENCOUNTER — Encounter (HOSPITAL_COMMUNITY): Payer: Self-pay

## 2014-05-17 DIAGNOSIS — Z01818 Encounter for other preprocedural examination: Secondary | ICD-10-CM | POA: Insufficient documentation

## 2014-05-17 DIAGNOSIS — Z01812 Encounter for preprocedural laboratory examination: Secondary | ICD-10-CM | POA: Insufficient documentation

## 2014-05-17 HISTORY — DX: Personal history of other mental and behavioral disorders: Z86.59

## 2014-05-17 HISTORY — DX: Polyneuropathy, unspecified: G62.9

## 2014-05-17 HISTORY — DX: Dermatitis, unspecified: L30.9

## 2014-05-17 HISTORY — DX: Paresthesia of skin: R20.2

## 2014-05-17 LAB — CBC WITH DIFFERENTIAL/PLATELET
BASOS ABS: 0 10*3/uL (ref 0.0–0.1)
BASOS PCT: 0 % (ref 0–1)
EOS ABS: 0.2 10*3/uL (ref 0.0–0.7)
EOS PCT: 1 % (ref 0–5)
HEMATOCRIT: 46.9 % — AB (ref 36.0–46.0)
Hemoglobin: 15.2 g/dL — ABNORMAL HIGH (ref 12.0–15.0)
Lymphocytes Relative: 30 % (ref 12–46)
Lymphs Abs: 4.8 10*3/uL — ABNORMAL HIGH (ref 0.7–4.0)
MCH: 30.6 pg (ref 26.0–34.0)
MCHC: 32.4 g/dL (ref 30.0–36.0)
MCV: 94.4 fL (ref 78.0–100.0)
MONO ABS: 1.4 10*3/uL — AB (ref 0.1–1.0)
Monocytes Relative: 8 % (ref 3–12)
NEUTROS ABS: 9.7 10*3/uL — AB (ref 1.7–7.7)
Neutrophils Relative %: 61 % (ref 43–77)
Platelets: 275 10*3/uL (ref 150–400)
RBC: 4.97 MIL/uL (ref 3.87–5.11)
RDW: 13.5 % (ref 11.5–15.5)
WBC: 16.1 10*3/uL — ABNORMAL HIGH (ref 4.0–10.5)

## 2014-05-17 LAB — COMPREHENSIVE METABOLIC PANEL
ALBUMIN: 3.6 g/dL (ref 3.5–5.2)
ALT: 22 U/L (ref 0–35)
AST: 19 U/L (ref 0–37)
Alkaline Phosphatase: 57 U/L (ref 39–117)
BUN: 14 mg/dL (ref 6–23)
CALCIUM: 10 mg/dL (ref 8.4–10.5)
CHLORIDE: 100 meq/L (ref 96–112)
CO2: 27 mEq/L (ref 19–32)
CREATININE: 0.69 mg/dL (ref 0.50–1.10)
GFR calc Af Amer: >90 mL/min (ref >90)
GFR calc non Af Amer: >90 mL/min (ref >90)
Glucose, Bld: 83 mg/dL (ref 70–99)
Potassium: 4.6 mEq/L (ref 3.7–5.3)
Sodium: 141 mEq/L (ref 137–147)
Total Bilirubin: 0.3 mg/dL (ref 0.3–1.2)
Total Protein: 8.2 g/dL (ref 6.0–8.3)

## 2014-05-17 LAB — PROTIME-INR
INR: 1.01 (ref 0.00–1.49)
Prothrombin Time: 13.1 seconds (ref 11.6–15.2)

## 2014-05-17 NOTE — Progress Notes (Signed)
CBC routed to Dr. Zella Richer

## 2014-05-17 NOTE — Telephone Encounter (Signed)
Pt calling in asking if her pre op work up she did today is normal so she doesn't have to push back her surgery again. Advised we will have one of Dr Bertrum Sol partners to review results and we would call her back.

## 2014-05-17 NOTE — Patient Instructions (Addendum)
Denise Case  05/17/2014                           YOUR PROCEDURE IS SCHEDULED ON: 05/25/14 at 11:30 am               [facility]  SIGNS TO SHORT STAY CENTER                 ARRIVE AT SHORT STAY AT: 9:30 AM               CALL THIS NUMBER IF ANY PROBLEMS THE DAY OF SURGERY :               [phone]                                REMEMBER:   Do not eat food or drink liquids AFTER MIDNIGHT   May have clear liquids UNTIL 6 HOURS BEFORE SURGERY               Take these medicines the morning of surgery with               A SIPS OF WATER : DEXILANT / GABAPENTIN / LEVOTHYROXIN (THYROID) / NUCYNTA / THEOPHYLINE / ALPRAZOLAM         Do not wear jewelry, make-up   Do not wear lotions, powders, or perfumes.   Do not shave legs or underarms 12 hrs. before surgery (men may shave face)  Do not bring valuables to the hospital.  Contacts, dentures or bridgework may not be worn into surgery.  Leave suitcase in the car. After surgery it may be brought to your room.  For patients admitted to the hospital more than one night, checkout time is            11:00 AM                                                       The day of discharge.   Patients discharged the day of surgery will not be allowed to drive home.            If going home same day of surgery, must have someone stay with you              FIRST 24 hrs at home and arrange for some one to drive you              home from hospital.   ________________________________________________________________________                                                                        [facility]  Before surgery, you can play an important role.  Because skin is not sterile, your  skin needs to be as free of germs as possible.  You can reduce the number of germs on your skin by washing with CHG (chlorahexidine gluconate) soap before  surgery.  CHG is an antiseptic cleaner which kills germs and bonds with the skin to continue killing germs even after washing. Please DO NOT use if you have an allergy to CHG or antibacterial soaps.  If your skin becomes reddened/irritated stop using the CHG and inform your nurse when you arrive at Short Stay. Do not shave (including legs and underarms) for at least 48 hours prior to the first CHG shower.  You may shave your face. Please follow these instructions carefully:   1.  Shower with CHG Soap the night before surgery and the  morning of Surgery.   2.  If you choose to wash your hair, wash your hair first as usual with your  normal  Shampoo.   3.  After you shampoo, rinse your hair and body thoroughly to remove the  shampoo.                                         4.  Use CHG as you would any other liquid soap.  You can apply chg directly  to the skin and wash . Gently wash with scrungie or clean wascloth    5.  Apply the CHG Soap to your body ONLY FROM THE NECK DOWN.   Do not use on open                           Wound or open sores. Avoid contact with eyes, ears mouth and genitals (private parts).                        Genitals (private parts) with your normal soap.              6.  Wash thoroughly, paying special attention to the area where your surgery  will be performed.   7.  Thoroughly rinse your body with warm water from the neck down.   8.  DO NOT shower/wash with your normal soap after using and rinsing off  the CHG Soap .                9.  Pat yourself dry with a clean towel.             10.  Wear clean pajamas.             11.  Place clean sheets on your bed the night of your first shower and do not  sleep with pets.  Day of Surgery : Do not apply any lotions/deodorants the morning of surgery.  Please wear clean clothes to the hospital/surgery center.  FAILURE TO FOLLOW THESE INSTRUCTIONS MAY RESULT IN THE CANCELLATION OF YOUR SURGERY    PATIENT  SIGNATURE_________________________________

## 2014-05-17 NOTE — Progress Notes (Signed)
05/17/14 1321  OBSTRUCTIVE SLEEP APNEA  Have you ever been diagnosed with sleep apnea through a sleep study? No  Do you snore loudly (loud enough to be heard through closed doors)?  1  Do you often feel tired, fatigued, or sleepy during the daytime? 1  Has anyone observed you stop breathing during your sleep? 0  Do you have, or are you being treated for high blood pressure? 0  BMI more than 35 kg/m2? 1  Age over 56 years old? 1  Neck circumference greater than 40 cm/16 inches? 1  Gender: 0  Obstructive Sleep Apnea Score 5

## 2014-05-22 ENCOUNTER — Encounter (INDEPENDENT_AMBULATORY_CARE_PROVIDER_SITE_OTHER): Payer: 59 | Admitting: General Surgery

## 2014-05-24 MED ORDER — CEFAZOLIN SODIUM 10 G IJ SOLR
3.0000 g | INTRAMUSCULAR | Status: AC
  Administered 2014-05-25: 3 g via INTRAVENOUS
  Filled 2014-05-24: qty 3000

## 2014-05-25 ENCOUNTER — Encounter (HOSPITAL_COMMUNITY): Payer: 59 | Admitting: Registered Nurse

## 2014-05-25 ENCOUNTER — Ambulatory Visit (HOSPITAL_COMMUNITY): Payer: 59

## 2014-05-25 ENCOUNTER — Observation Stay (HOSPITAL_COMMUNITY)
Admission: RE | Admit: 2014-05-25 | Discharge: 2014-05-26 | Disposition: A | Discharge status: Home / Self Care | Payer: 59 | Source: Ambulatory Visit | Attending: General Surgery | Admitting: General Surgery

## 2014-05-25 ENCOUNTER — Encounter (HOSPITAL_COMMUNITY)
Admission: RE | Disposition: A | Discharge status: Home / Self Care | Payer: Self-pay | Source: Ambulatory Visit | Attending: General Surgery

## 2014-05-25 ENCOUNTER — Encounter (HOSPITAL_COMMUNITY): Payer: Self-pay | Admitting: *Deleted

## 2014-05-25 ENCOUNTER — Ambulatory Visit (HOSPITAL_COMMUNITY): Payer: 59 | Admitting: Registered Nurse

## 2014-05-25 DIAGNOSIS — R110 Nausea: Secondary | ICD-10-CM | POA: Insufficient documentation

## 2014-05-25 DIAGNOSIS — T364X5A Adverse effect of tetracyclines, initial encounter: Secondary | ICD-10-CM | POA: Insufficient documentation

## 2014-05-25 DIAGNOSIS — J189 Pneumonia, unspecified organism: Secondary | ICD-10-CM | POA: Diagnosis not present

## 2014-05-25 DIAGNOSIS — J449 Chronic obstructive pulmonary disease, unspecified: Secondary | ICD-10-CM | POA: Insufficient documentation

## 2014-05-25 DIAGNOSIS — IMO0001 Reserved for inherently not codable concepts without codable children: Secondary | ICD-10-CM | POA: Insufficient documentation

## 2014-05-25 DIAGNOSIS — F17200 Nicotine dependence, unspecified, uncomplicated: Secondary | ICD-10-CM | POA: Diagnosis not present

## 2014-05-25 DIAGNOSIS — K811 Chronic cholecystitis: Secondary | ICD-10-CM

## 2014-05-25 DIAGNOSIS — Z9981 Dependence on supplemental oxygen: Secondary | ICD-10-CM | POA: Insufficient documentation

## 2014-05-25 DIAGNOSIS — Z79899 Other long term (current) drug therapy: Secondary | ICD-10-CM | POA: Diagnosis not present

## 2014-05-25 DIAGNOSIS — J4489 Other specified chronic obstructive pulmonary disease: Secondary | ICD-10-CM | POA: Insufficient documentation

## 2014-05-25 HISTORY — PX: CHOLECYSTECTOMY: SHX55

## 2014-05-25 LAB — GLUCOSE, CAPILLARY
GLUCOSE-CAPILLARY: 101 mg/dL — AB (ref 70–99)
GLUCOSE-CAPILLARY: 107 mg/dL — AB (ref 70–99)

## 2014-05-25 SURGERY — LAPAROSCOPIC CHOLECYSTECTOMY WITH INTRAOPERATIVE CHOLANGIOGRAM
Anesthesia: General

## 2014-05-25 MED ORDER — FENTANYL CITRATE 0.05 MG/ML IJ SOLN
INTRAMUSCULAR | Status: DC | PRN
  Administered 2014-05-25 (×5): 50 ug via INTRAVENOUS

## 2014-05-25 MED ORDER — 0.9 % SODIUM CHLORIDE (POUR BTL) OPTIME
TOPICAL | Status: DC | PRN
  Administered 2014-05-25: 1000 mL

## 2014-05-25 MED ORDER — ONDANSETRON HCL 4 MG/2ML IJ SOLN
INTRAMUSCULAR | Status: DC | PRN
  Administered 2014-05-25: 4 mg via INTRAVENOUS

## 2014-05-25 MED ORDER — PROPOFOL 10 MG/ML IV BOLUS
INTRAVENOUS | Status: DC | PRN
  Administered 2014-05-25: 250 mg via INTRAVENOUS

## 2014-05-25 MED ORDER — LIDOCAINE HCL (CARDIAC) 20 MG/ML IV SOLN
INTRAVENOUS | Status: DC | PRN
  Administered 2014-05-25: 100 mg via INTRAVENOUS

## 2014-05-25 MED ORDER — IOHEXOL 300 MG/ML  SOLN
INTRAMUSCULAR | Status: DC | PRN
  Administered 2014-05-25: 50 mL via INTRAVENOUS

## 2014-05-25 MED ORDER — THEOPHYLLINE ER 300 MG PO CP24
300.0000 mg | ORAL_CAPSULE | Freq: Two times a day (BID) | ORAL | Status: DC
  Administered 2014-05-25: 300 mg via ORAL
  Filled 2014-05-25 (×3): qty 1

## 2014-05-25 MED ORDER — OXYCODONE-ACETAMINOPHEN 5-325 MG PO TABS
1.0000 | ORAL_TABLET | ORAL | Status: DC | PRN
  Administered 2014-05-26: 2 via ORAL
  Filled 2014-05-25: qty 2

## 2014-05-25 MED ORDER — LACTATED RINGERS IV SOLN
INTRAVENOUS | Status: DC
  Administered 2014-05-25: 1000 mL via INTRAVENOUS

## 2014-05-25 MED ORDER — PROMETHAZINE HCL 25 MG/ML IJ SOLN: 6.2500 mg | INTRAMUSCULAR | Status: DC | PRN

## 2014-05-25 MED ORDER — ROCURONIUM BROMIDE 100 MG/10ML IV SOLN
INTRAVENOUS | Status: AC
  Filled 2014-05-25: qty 1

## 2014-05-25 MED ORDER — NEOSTIGMINE METHYLSULFATE 10 MG/10ML IV SOLN
INTRAVENOUS | Status: DC | PRN
  Administered 2014-05-25: 5 mg via INTRAVENOUS

## 2014-05-25 MED ORDER — ALBUTEROL SULFATE HFA 108 (90 BASE) MCG/ACT IN AERS
INHALATION_SPRAY | RESPIRATORY_TRACT | Status: AC
  Filled 2014-05-25: qty 6.7

## 2014-05-25 MED ORDER — BIOTENE DRY MOUTH MT LIQD
15.0000 mL | Freq: Two times a day (BID) | OROMUCOSAL | Status: DC
  Administered 2014-05-25 – 2014-05-26 (×2): 15 mL via OROMUCOSAL

## 2014-05-25 MED ORDER — ONDANSETRON HCL 4 MG/2ML IJ SOLN
INTRAMUSCULAR | Status: AC
  Filled 2014-05-25: qty 2

## 2014-05-25 MED ORDER — ONDANSETRON HCL 4 MG/2ML IJ SOLN: 4.0000 mg | INTRAMUSCULAR | Status: DC | PRN

## 2014-05-25 MED ORDER — HYDROMORPHONE HCL PF 1 MG/ML IJ SOLN
INTRAMUSCULAR | Status: AC
Start: 2014-05-25 — End: 2014-05-26
  Filled 2014-05-25: qty 1

## 2014-05-25 MED ORDER — GABAPENTIN 400 MG PO CAPS
400.0000 mg | ORAL_CAPSULE | Freq: Two times a day (BID) | ORAL | Status: DC
  Administered 2014-05-26: 400 mg via ORAL
  Filled 2014-05-25 (×3): qty 1

## 2014-05-25 MED ORDER — FENTANYL CITRATE 0.05 MG/ML IJ SOLN
25.0000 ug | INTRAMUSCULAR | Status: DC | PRN
  Administered 2014-05-25 (×3): 50 ug via INTRAVENOUS

## 2014-05-25 MED ORDER — GABAPENTIN 800 MG PO TABS: 400.0000 mg | ORAL_TABLET | Freq: Three times a day (TID) | ORAL | Status: DC

## 2014-05-25 MED ORDER — ALPRAZOLAM 1 MG PO TABS
1.0000 mg | ORAL_TABLET | Freq: Three times a day (TID) | ORAL | Status: DC | PRN
  Administered 2014-05-25: 1 mg via ORAL
  Filled 2014-05-25 (×2): qty 1

## 2014-05-25 MED ORDER — LACTATED RINGERS IV SOLN
INTRAVENOUS | Status: DC | PRN
  Administered 2014-05-25: 1000 mL via INTRAVENOUS

## 2014-05-25 MED ORDER — GLYCOPYRROLATE 0.2 MG/ML IJ SOLN
INTRAMUSCULAR | Status: AC
  Filled 2014-05-25: qty 4

## 2014-05-25 MED ORDER — MEPERIDINE HCL 50 MG/ML IJ SOLN: 6.2500 mg | INTRAMUSCULAR | Status: DC | PRN

## 2014-05-25 MED ORDER — ALBUTEROL SULFATE (2.5 MG/3ML) 0.083% IN NEBU
2.5000 mg | INHALATION_SOLUTION | RESPIRATORY_TRACT | Status: DC | PRN
  Administered 2014-05-26: 2.5 mg via RESPIRATORY_TRACT
  Filled 2014-05-25: qty 3

## 2014-05-25 MED ORDER — METHOCARBAMOL 500 MG PO TABS
500.0000 mg | ORAL_TABLET | Freq: Four times a day (QID) | ORAL | Status: DC | PRN
  Administered 2014-05-25: 500 mg via ORAL
  Filled 2014-05-25: qty 1

## 2014-05-25 MED ORDER — BUPIVACAINE HCL 0.5 % IJ SOLN
INTRAMUSCULAR | Status: DC | PRN
  Administered 2014-05-25: 23 mL

## 2014-05-25 MED ORDER — FENTANYL CITRATE 0.05 MG/ML IJ SOLN
INTRAMUSCULAR | Status: AC
  Filled 2014-05-25: qty 5

## 2014-05-25 MED ORDER — FLURAZEPAM HCL 30 MG PO CAPS
30.0000 mg | ORAL_CAPSULE | Freq: Every day | ORAL | Status: DC
  Administered 2014-05-25: 30 mg via ORAL
  Filled 2014-05-25: qty 1

## 2014-05-25 MED ORDER — MORPHINE SULFATE 2 MG/ML IJ SOLN
2.0000 mg | INTRAMUSCULAR | Status: DC | PRN
  Administered 2014-05-25 – 2014-05-26 (×4): 2 mg via INTRAVENOUS
  Filled 2014-05-25 (×4): qty 1

## 2014-05-25 MED ORDER — HYDROMORPHONE HCL PF 1 MG/ML IJ SOLN
0.5000 mg | INTRAMUSCULAR | Status: DC | PRN
  Administered 2014-05-25 (×3): 0.5 mg via INTRAVENOUS

## 2014-05-25 MED ORDER — SUCCINYLCHOLINE CHLORIDE 20 MG/ML IJ SOLN
INTRAMUSCULAR | Status: DC | PRN
  Administered 2014-05-25: 140 mg via INTRAVENOUS

## 2014-05-25 MED ORDER — PROPOFOL 10 MG/ML IV BOLUS
INTRAVENOUS | Status: AC
  Filled 2014-05-25: qty 20

## 2014-05-25 MED ORDER — BUPIVACAINE HCL (PF) 0.5 % IJ SOLN
INTRAMUSCULAR | Status: AC
  Filled 2014-05-25: qty 30

## 2014-05-25 MED ORDER — LACTATED RINGERS IV SOLN: INTRAVENOUS | Status: DC

## 2014-05-25 MED ORDER — ROCURONIUM BROMIDE 100 MG/10ML IV SOLN
INTRAVENOUS | Status: DC | PRN
  Administered 2014-05-25: 50 mg via INTRAVENOUS
  Administered 2014-05-25: 10 mg via INTRAVENOUS

## 2014-05-25 MED ORDER — DEXTROSE 5 % IV SOLN: 3.0000 g | INTRAVENOUS | Status: DC

## 2014-05-25 MED ORDER — ALBUTEROL SULFATE HFA 108 (90 BASE) MCG/ACT IN AERS
INHALATION_SPRAY | RESPIRATORY_TRACT | Status: DC | PRN
  Administered 2014-05-25 (×3): 2 via RESPIRATORY_TRACT

## 2014-05-25 MED ORDER — KCL-LACTATED RINGERS-D5W 20 MEQ/L IV SOLN
INTRAVENOUS | Status: DC
  Administered 2014-05-25: 16:00:00 via INTRAVENOUS
  Filled 2014-05-25 (×3): qty 1000

## 2014-05-25 MED ORDER — LEVOTHYROXINE SODIUM 200 MCG PO TABS
200.0000 ug | ORAL_TABLET | Freq: Every day | ORAL | Status: DC
  Administered 2014-05-26: 200 ug via ORAL
  Filled 2014-05-25 (×2): qty 1

## 2014-05-25 MED ORDER — HYDROMORPHONE HCL PF 1 MG/ML IJ SOLN
INTRAMUSCULAR | Status: AC
  Filled 2014-05-25: qty 1

## 2014-05-25 MED ORDER — MIDAZOLAM HCL 5 MG/5ML IJ SOLN
INTRAMUSCULAR | Status: DC | PRN
  Administered 2014-05-25 (×2): 1 mg via INTRAVENOUS

## 2014-05-25 MED ORDER — ONDANSETRON HCL 4 MG PO TABS: 4.0000 mg | ORAL_TABLET | Freq: Four times a day (QID) | ORAL | Status: DC | PRN

## 2014-05-25 MED ORDER — GLYCOPYRROLATE 0.2 MG/ML IJ SOLN
INTRAMUSCULAR | Status: DC | PRN
  Administered 2014-05-25: .8 mg via INTRAVENOUS

## 2014-05-25 MED ORDER — CEFAZOLIN SODIUM 10 G IJ SOLR: 3.0000 g | INTRAMUSCULAR | Status: DC

## 2014-05-25 MED ORDER — LIDOCAINE HCL (CARDIAC) 20 MG/ML IV SOLN
INTRAVENOUS | Status: AC
  Filled 2014-05-25: qty 5

## 2014-05-25 MED ORDER — AMITRIPTYLINE HCL 25 MG PO TABS
25.0000 mg | ORAL_TABLET | Freq: Every day | ORAL | Status: DC
  Administered 2014-05-25: 25 mg via ORAL
  Filled 2014-05-25 (×2): qty 1

## 2014-05-25 MED ORDER — HEPARIN SODIUM (PORCINE) 5000 UNIT/ML IJ SOLN
5000.0000 [IU] | Freq: Three times a day (TID) | INTRAMUSCULAR | Status: DC
  Administered 2014-05-26: 5000 [IU] via SUBCUTANEOUS
  Filled 2014-05-25 (×4): qty 1

## 2014-05-25 MED ORDER — FENTANYL CITRATE 0.05 MG/ML IJ SOLN
INTRAMUSCULAR | Status: AC
  Filled 2014-05-25: qty 2

## 2014-05-25 MED ORDER — CYCLOBENZAPRINE HCL 10 MG PO TABS
10.0000 mg | ORAL_TABLET | Freq: Three times a day (TID) | ORAL | Status: DC | PRN
  Filled 2014-05-25: qty 1

## 2014-05-25 MED ORDER — GABAPENTIN 400 MG PO CAPS
800.0000 mg | ORAL_CAPSULE | Freq: Every day | ORAL | Status: DC
  Administered 2014-05-25: 800 mg via ORAL
  Filled 2014-05-25 (×2): qty 2

## 2014-05-25 MED ORDER — PNEUMOCOCCAL VAC POLYVALENT 25 MCG/0.5ML IJ INJ
0.5000 mL | INJECTION | INTRAMUSCULAR | Status: AC
  Administered 2014-05-26: 0.5 mL via INTRAMUSCULAR
  Filled 2014-05-25 (×2): qty 0.5

## 2014-05-25 MED ORDER — MIDAZOLAM HCL 2 MG/2ML IJ SOLN
INTRAMUSCULAR | Status: AC
  Filled 2014-05-25: qty 2

## 2014-05-25 MED ORDER — FENTANYL CITRATE 0.05 MG/ML IJ SOLN
INTRAMUSCULAR | Status: AC
Start: 2014-05-25 — End: 2014-05-26
  Filled 2014-05-25: qty 2

## 2014-05-25 SURGICAL SUPPLY — 43 items
APL SKNCLS STERI-STRIP NONHPOA (GAUZE/BANDAGES/DRESSINGS) ×1
APPLIER CLIP 5 13 M/L LIGAMAX5 (MISCELLANEOUS) ×3
APR CLP MED LRG 5 ANG JAW (MISCELLANEOUS) ×1
BAG SPEC RTRVL LRG 6X4 10 (ENDOMECHANICALS) ×1
BENZOIN TINCTURE PRP APPL 2/3 (GAUZE/BANDAGES/DRESSINGS) ×3 IMPLANT
CHLORAPREP W/TINT 26ML (MISCELLANEOUS) ×5 IMPLANT
CLIP APPLIE 5 13 M/L LIGAMAX5 (MISCELLANEOUS) ×1 IMPLANT
CLOSURE WOUND 1/2 X4 (GAUZE/BANDAGES/DRESSINGS) ×1
COVER MAYO STAND STRL (DRAPES) ×3 IMPLANT
DECANTER SPIKE VIAL GLASS SM (MISCELLANEOUS) ×1 IMPLANT
DISSECTOR BLUNT TIP ENDO 5MM (MISCELLANEOUS) IMPLANT
DRAPE C-ARM 42X120 X-RAY (DRAPES) ×3 IMPLANT
DRAPE LAPAROSCOPIC ABDOMINAL (DRAPES) ×3 IMPLANT
DRAPE UTILITY XL STRL (DRAPES) ×3 IMPLANT
DRSG TEGADERM 2-3/8X2-3/4 SM (GAUZE/BANDAGES/DRESSINGS) ×5 IMPLANT
ELECT REM PT RETURN 9FT ADLT (ELECTROSURGICAL) ×3
ELECTRODE REM PT RTRN 9FT ADLT (ELECTROSURGICAL) ×1 IMPLANT
ENDOLOOP SUT PDS II  0 18 (SUTURE)
ENDOLOOP SUT PDS II 0 18 (SUTURE) IMPLANT
GAUZE SPONGE 2X2 8PLY STRL LF (GAUZE/BANDAGES/DRESSINGS) ×1 IMPLANT
GLOVE ECLIPSE 8.0 STRL XLNG CF (GLOVE) ×3 IMPLANT
GLOVE INDICATOR 8.0 STRL GRN (GLOVE) ×3 IMPLANT
GOWN STRL REUS W/TWL XL LVL3 (GOWN DISPOSABLE) ×13 IMPLANT
HEMOSTAT SNOW SURGICEL 2X4 (HEMOSTASIS) IMPLANT
HOVERMATT SINGLE USE (MISCELLANEOUS) ×2 IMPLANT
KIT BASIN OR (CUSTOM PROCEDURE TRAY) ×3 IMPLANT
POUCH SPECIMEN RETRIEVAL 10MM (ENDOMECHANICALS) ×3 IMPLANT
SCISSORS LAP 5X35 DISP (ENDOMECHANICALS) ×3 IMPLANT
SET CHOLANGIOGRAPH MIX (MISCELLANEOUS) ×3 IMPLANT
SET IRRIG TUBING LAPAROSCOPIC (IRRIGATION / IRRIGATOR) ×3 IMPLANT
SLEEVE ENDOPATH XCEL 5M (ENDOMECHANICALS) ×2 IMPLANT
SLEEVE XCEL OPT CAN 5 100 (ENDOMECHANICALS) ×6 IMPLANT
SOLUTION ANTI FOG 6CC (MISCELLANEOUS) ×3 IMPLANT
SPONGE GAUZE 2X2 STER 10/PKG (GAUZE/BANDAGES/DRESSINGS) ×2
STRIP CLOSURE SKIN 1/2X4 (GAUZE/BANDAGES/DRESSINGS) ×2 IMPLANT
SUT MNCRL AB 4-0 PS2 18 (SUTURE) ×3 IMPLANT
TOWEL OR 17X26 10 PK STRL BLUE (TOWEL DISPOSABLE) ×3 IMPLANT
TOWEL OR NON WOVEN STRL DISP B (DISPOSABLE) ×3 IMPLANT
TRAY LAP CHOLE (CUSTOM PROCEDURE TRAY) ×3 IMPLANT
TROCAR BLADELESS OPT 5 100 (ENDOMECHANICALS) ×3 IMPLANT
TROCAR XCEL BLUNT TIP 100MML (ENDOMECHANICALS) ×3 IMPLANT
TROCAR XCEL NON-BLD 11X100MML (ENDOMECHANICALS) IMPLANT
TUBING INSUFFLATION 10FT LAP (TUBING) ×3 IMPLANT

## 2014-05-25 NOTE — Transfer of Care (Signed)
Immediate Anesthesia Transfer of Care Note  Patient: Denise Case  Procedure(s) Performed: Procedure(s): LAPAROSCOPIC CHOLECYSTECTOMY WITH INTRAOPERATIVE CHOLANGIOGRAM (N/A)  Patient Location: PACU  Anesthesia Type:General  Level of Consciousness: awake, alert , oriented and patient cooperative  Airway & Oxygen Therapy: Patient Spontanous Breathing and Patient connected to face mask oxygen  Post-op Assessment: Report given to PACU RN, Post -op Vital signs reviewed and stable and Patient moving all extremities  Post vital signs: Reviewed and stable  Complications: No apparent anesthesia complications

## 2014-05-25 NOTE — H&P (View-Only) (Signed)
Patient ID: Denise Case, female   DOB: 03-Mar-1956, 58 y.o.   MRN: [id number]  Chief Complaint  Patient presents with  . Routine Post Op    f/u chest xray for PNA    HPI Denise Case is a 58 y.o. female.   HPI  She is here for followup of her community-acquired pneumonia. We had to cancel her elective cholecystectomy 3 days ago because of an infiltrate and a cough on chest x-ray. She has been started on doxycycline. Chest x-ray done today shows a resolving infiltrate in the right lower lobe.  The doxycycline is making her nauseated.  Past Medical History  Diagnosis Date  . COPD (chronic obstructive pulmonary disease)   . Fibromyalgia   . Thyroid disease   . Morbid obesity   . GERD (gastroesophageal reflux disease)   . Hyperlipidemia   . Asthma   . Diverticulitis   . Lumbar sprain   . Diabetes mellitus without complication   . Pneumonia 2015  . Hypothyroidism   . Migraine   . Depression   . Anxiety   . History of kidney stones   . Constipation due to pain medication   . Requires supplemental oxygen     PRN uses 1.5 Liters    Past Surgical History  Procedure Laterality Date  . Bladder tack      X 2  . Abdominal hysterectomy      History reviewed. No pertinent family history.  Social History History  Substance Use Topics  . Smoking status: Current Every Day Smoker -- 1.00 packs/day for 37 years    Types: Cigarettes  . Smokeless tobacco: Never Used  . Alcohol Use: No    Allergies  Allergen Reactions  . Montelukast Sodium Anaphylaxis and Swelling  . Doxycycline Other (See Comments)    Strips lining off of top of tongue, like thrush  . Hydrocodone-Acetaminophen Other (See Comments)    Migraines     Current Outpatient Prescriptions  Medication Sig Dispense Refill  . doxycycline (VIBRA-TABS) 100 MG tablet Take 100 mg by mouth 2 (two) times daily.      Marland Kitchen ALPRAZolam (XANAX) 1 MG tablet Take 1 mg by mouth 3 (three) times daily as needed for anxiety.       Marland Kitchen  amitriptyline (ELAVIL) 25 MG tablet Take 25 mg by mouth at bedtime.      Marland Kitchen CALCIUM-VITAMIN D PO Take 1 tablet by mouth daily.      . Cinnamon 500 MG capsule Take 500 mg by mouth 2 (two) times daily.      . cyanocobalamin 1000 MCG tablet Take 1,000 mcg by mouth daily.       Marland Kitchen dexlansoprazole (DEXILANT) 60 MG capsule Take 60 mg by mouth daily.      . ergocalciferol (VITAMIN D2) 50000 UNITS capsule Take 50,000 Units by mouth once a week. Friday      . fish oil-omega-3 fatty acids 1000 MG capsule Take 1 g by mouth 3 (three) times daily with meals.       . flurazepam (DALMANE) 30 MG capsule Take 30 mg by mouth at bedtime.       . gabapentin (NEURONTIN) 800 MG tablet Take 400-800 mg by mouth 2 (two) times daily. 0.5 tablet (400 mg) in the morning and then 0.5 tablet (400 mg) 8 hours later and 1 tablet (800mg ) at bedtime.      . Garlic 1829 MG CAPS Take 1,000 mg by mouth daily.       Marland Kitchen  levothyroxine (SYNTHROID) 200 MCG tablet Take 200 mcg by mouth daily before breakfast.       . methocarbamol (ROBAXIN) 750 MG tablet Take 750 mg by mouth 3 (three) times daily. 6 am,2 pm,10 pm      . mineral oil liquid Take 15-30 mLs by mouth at bedtime. Constipation      . ondansetron (ZOFRAN) 4 MG tablet Take 1 tablet (4 mg total) by mouth every 4 (four) hours as needed for nausea or vomiting.  30 tablet  0  . OVER THE COUNTER MEDICATION Take 1 tablet by mouth 2 (two) times daily with a meal. Garcinia cambogia (weight loss supplement)      . Red Yeast Rice Extract (RED YEAST RICE PO) Take 1,000 mg by mouth daily before breakfast.      . Tapentadol HCl (NUCYNTA ER) 100 MG TB12 Take 100 mg by mouth 3 (three) times daily.       . theophylline (Denise-24) 300 MG 24 hr capsule Take 300 mg by mouth 2 (two) times daily.       . vitamin C (ASCORBIC ACID) 500 MG tablet Take 500 mg by mouth daily.      . Zinc 50 MG TABS Take 50 mg by mouth daily.       No current facility-administered medications for this visit.    Review of  Systems Review of Systems  Constitutional: Negative for fever and chills.  Gastrointestinal: Positive for nausea and abdominal pain.    Blood pressure 116/62, pulse 64, temperature 98.6 F (37 C), temperature source Oral, resp. rate 18, weight 305 lb 6.4 oz (138.529 kg).  Physical Exam Physical Exam  Constitutional: No distress.  Obese female  Pulmonary/Chest: Effort normal and breath sounds normal. No respiratory distress. She has no wheezes. She has no rales.    Data Reviewed Chest x-ray from today  Assessment    #1. Chronic cholecystitis-symptomatic  2. Community-acquired pneumonia-improving on oral antibiotic. She has 8-9 more days and then the buttocks. She's having some problems with nausea due to the antibiotics.     Plan    Will give her a prescription for Zofran for nausea. Will schedule her surgery once the antibiotics have been completed.        Rhunette Croft Analisa Sledd 05/04/2014, 5:03 PM

## 2014-05-25 NOTE — Discharge Instructions (Signed)
[facility] ______[facility] LAPAROSCOPIC SURGERY: POST OP INSTRUCTIONS Always review your discharge instruction sheet given to you by the facility where your surgery was performed. IF YOU HAVE DISABILITY OR FAMILY LEAVE FORMS, YOU MUST BRING THEM TO THE OFFICE FOR PROCESSING.   DO NOT GIVE THEM TO YOUR DOCTOR.  1. A prescription for pain medication may be given to you upon discharge.  Take your pain medication as prescribed, if needed.  If narcotic pain medicine is not needed, then you may take acetaminophen (Tylenol) or ibuprofen (Advil) as needed. 2. Take your usually prescribed medications unless otherwise directed. 3. If you need a refill on your pain medication, please contact your pharmacy.  They will contact our office to request authorization. Prescriptions will not be filled after 5pm or on week-ends. 4. You should follow a light diet the first few days after arrival home, such as soup and crackers, etc.  Be sure to include lots of fluids daily. 5. Most patients will experience some swelling and bruising in the area of the incisions.  Ice packs will help.  Swelling and bruising can take several days to resolve.  6. It is common to experience some constipation if taking pain medication after surgery.  Increasing fluid intake and taking a stool softener (such as Colace) will usually help or prevent this problem from occurring.  A mild laxative (Milk of Magnesia or Miralax) should be taken according to package instructions if there are no bowel movements after 48 hours. 7. Unless discharge instructions indicate otherwise, you may remove your bandages 72 hours after surgery, and you may shower at that time.  You may have steri-strips (small skin tapes) in place directly over the incision.  These strips should be left on the skin.  If your surgeon used skin glue on the incision, you may shower in 24 hours.  The glue will flake off over the next 2-3 weeks.  Any sutures or staples will be  removed at the office during your follow-up visit. 8. ACTIVITIES:  You may resume regular (light) daily activities beginning the next day--such as daily self-care, walking, climbing stairs--gradually increasing activities as tolerated.  You may have sexual intercourse when it is comfortable.  Refrain from any heavy lifting or straining for 2 weeks. Nothing over 10 pounds. a. You may drive when you are no longer taking prescription pain medication, you can comfortably wear a seatbelt, and you can safely maneuver your car and apply brakes. b. RETURN TO WORK:  __________________________________________________________ 9. You should see your doctor in the office for a follow-up appointment approximately 2-3 weeks after your surgery.  Make sure that you call for this appointment within a day or two after you arrive home to insure a convenient appointment time. 10. OTHER INSTRUCTIONS: __________________________________________________________________________________________________________________________ __________________________________________________________________________________________________________________________ WHEN TO CALL YOUR DOCTOR: 1. Fever over 101.0 2. Inability to urinate 3. Continued bleeding from incision. 4. Increased pain, redness, or drainage from the incision. 5. Increasing abdominal pain  The clinic staff is available to answer your questions during regular business hours.  Please dont hesitate to call and ask to speak to one of the nurses for clinical concerns.  If you have a medical emergency, go to the nearest emergency room or call 911.  A surgeon from [facility] is always on call at the hospital. [address] [place], [place], [place]  [address] ? [address], [place], [place]   [address] [phone] ? [phone] ? FAX [phone] Web site: [email or web address]

## 2014-05-25 NOTE — Anesthesia Preprocedure Evaluation (Addendum)
Anesthesia Evaluation  Patient identified by MRN, date of birth, ID band Patient awake    Reviewed: Allergy & Precautions, H&P , NPO status , Patient's Chart, lab work & pertinent test results  Airway Mallampati: II TM Distance: >3 FB Neck ROM: Full    Dental no notable dental hx. (+) Edentulous Upper   Pulmonary COPD oxygen dependent, Current Smoker,  breath sounds clear to auscultation  Pulmonary exam normal       Cardiovascular negative cardio ROS  Rhythm:Regular Rate:Normal     Neuro/Psych negative neurological ROS  negative psych ROS   GI/Hepatic negative GI ROS, Neg liver ROS,   Endo/Other  diabetesHypothyroidism Morbid obesity  Renal/GU negative Renal ROS  negative genitourinary   Musculoskeletal negative musculoskeletal ROS (+) Fibromyalgia -  Abdominal   Peds negative pediatric ROS (+)  Hematology negative hematology ROS (+)   Anesthesia Other Findings   Reproductive/Obstetrics negative OB ROS                          Anesthesia Physical Anesthesia Plan  ASA: III  Anesthesia Plan: General   Post-op Pain Management:    Induction: Intravenous  Airway Management Planned: Oral ETT  Additional Equipment:   Intra-op Plan:   Post-operative Plan: Extubation in OR  Informed Consent: I have reviewed the patients History and Physical, chart, labs and discussed the procedure including the risks, benefits and alternatives for the proposed anesthesia with the patient or authorized representative who has indicated his/her understanding and acceptance.   Dental advisory given  Plan Discussed with: CRNA  Anesthesia Plan Comments:         Anesthesia Quick Evaluation

## 2014-05-25 NOTE — Anesthesia Postprocedure Evaluation (Signed)
  Anesthesia Post-op Note  Patient: Denise Case  Procedure(s) Performed: Procedure(s) (LRB): LAPAROSCOPIC CHOLECYSTECTOMY WITH INTRAOPERATIVE CHOLANGIOGRAM (N/A)  Patient Location: PACU  Anesthesia Type: General  Level of Consciousness: awake and alert   Airway and Oxygen Therapy: Patient Spontanous Breathing  Post-op Pain: mild  Post-op Assessment: Post-op Vital signs reviewed, Patient's Cardiovascular Status Stable, Respiratory Function Stable, Patent Airway and No signs of Nausea or vomiting  Last Vitals:  Filed Vitals:   05/25/14 1430  BP: 113/44  Pulse: 89  Temp:   Resp: 14    Post-op Vital Signs: stable   Complications: No apparent anesthesia complications

## 2014-05-25 NOTE — Interval H&P Note (Signed)
History and Physical Interval Note:  05/25/2014 11:29 AM  Denise Case  has presented today for surgery, with the diagnosis of chronic cholecystitis  The various methods of treatment have been discussed with the patient and family. After consideration of risks, benefits and other options for treatment, the patient has consented to  Procedure(s): LAPAROSCOPIC CHOLECYSTECTOMY WITH INTRAOPERATIVE CHOLANGIOGRAM (N/A) as a surgical intervention .  The patient's history has been reviewed, patient examined, no change in status, stable for surgery.  I have reviewed the patient's chart and labs.  Questions were answered to the patient's satisfaction.     ROSENBOWER,TODD Lenna Sciara

## 2014-05-25 NOTE — Op Note (Signed)
Preoperative diagnosis:  Chronic cholecystitis  Postoperative diagnosis:  Same  Procedure: Laparoscopic cholecystectomy with cholangiogram.  Surgeon: Jackolyn Confer, M.D.  Asst.:  Autumn Messing, M.D.  Anesthesia: General  Indication:   This is a 58 year old female with some long-standing biliary colic type symptoms. She's had a significant workup. Gallbladder ejection fraction is at the lower end of normal. Symptoms are postprandial and continue. After multiple discussions with her, she has elected to proceed with cholecystectomy.  Technique: She was brought to the operating room, placed supine on the operating table, and a general anesthetic was administered. The hair on the abdominal wall was clipped as was necessary. The abdominal wall was then sterilely prepped and draped. Local anesthetic (Marcaine) was infiltrated in the supraumbilical region. A small supraumbilical incision was made through the skin, subcutaneous tissue, fascia, and peritoneum entering the peritoneal cavity under direct vision. A pursestring suture of 0 Vicryl was placed around the edges of the fascia. A Hassan trocar was introduced into the peritoneal cavity and a pneumoperitoneum was created by insufflation of carbon dioxide gas. The laparoscope was introduced into the trocar and no underlying bleeding or organ injury was noted. He/She was then placed in the reverse Trendelenburg position with the right side tilted slightly up.  Three 5 mm trocars were then placed into the abdominal cavity under laparoscopic vision. One in the epigastric area, and 2 in the right upper quadrant area.  There were some adhesions in the right upper quadrant between the omentum and anterior abdominal wall that were divided sharply. The gallbladder was visualized and adhesions between the omentum and fundus of the gallbladder were divided. Adhesions between the omentum and body of the gallbladder Were separated bluntly. Then, thefundus was grasped  and retracted toward the right shoulder.  The infundibulum was mobilized with dissection close to the gallbladder and retracted laterally. The cystic duct was identified and a window was created around it. The cystic artery was also identified and a window was created around it. The critical view was achieved. A clip was placed at the neck of the gallbladder. A small incision was made in the cystic duct. A cholangiocatheter was introduced through the anterior abdominal wall and placed in the cystic duct. A intraoperative cholangiogram was then performed.  Under real-time fluoroscopy, dilute contrast was injected into the cystic duct.  The common hepatic duct, the right and left hepatic ducts, and the common duct were all visualized. Contrast drained into the duodenum without obvious evidence of any obstructing ductal lesion. The final report is pending the Radiologist's interpretation.  The cholangiocatheter was removed, the cystic duct was clipped 3 times on the biliary side, and then the cystic duct was divided sharply. No bile leak was noted from the cystic duct stump.  The cystic artery was then clipped and divided. Following this the gallbladder was dissected free from the liver using electrocautery. The gallbladder was then placed in a retrieval bag and removed from the abdominal cavity through the supraumbilical incision.  The gallbladder fossa was inspected, irrigated, and bleeding was controlled with electrocautery. Inspection showed that hemostasis was adequate and there was no evidence of bile leak.  The irrigation fluid was evacuated as much as possible.  The supraumbilical trocar was removed and the fascial defect was closed by tightening and tying down the pursestring suture under laparoscopic vision.  The remaining trocars were removed and the pneumoperitoneum was released. The skin incisions were closed with 4-0 Monocryl subcuticular stitches. Steri-Strips and sterile dressings were  applied.  The procedure was well-tolerated without any apparent complications. She was taken to the recovery room in satisfactory condition.

## 2014-05-26 ENCOUNTER — Encounter (HOSPITAL_COMMUNITY): Payer: Self-pay | Admitting: General Surgery

## 2014-05-26 DIAGNOSIS — K811 Chronic cholecystitis: Secondary | ICD-10-CM | POA: Diagnosis not present

## 2014-05-26 MED ORDER — THEOPHYLLINE ER 300 MG PO TB12
300.0000 mg | ORAL_TABLET | Freq: Two times a day (BID) | ORAL | Status: DC
  Administered 2014-05-26: 300 mg via ORAL
  Filled 2014-05-26 (×2): qty 1

## 2014-05-26 MED ORDER — TRAMADOL HCL 50 MG PO TABS: 50.0000 mg | ORAL_TABLET | Freq: Four times a day (QID) | ORAL | Status: DC | PRN

## 2014-05-26 NOTE — Care Management Note (Signed)
    Page 1 of 1   05/26/2014     10:06:28 AM CARE MANAGEMENT NOTE 05/26/2014  Patient:  Denise Case, Denise Case   Account Number:  [id number]  Date Initiated:  05/26/2014  Documentation initiated by:  Sunday Spillers  Subjective/Objective Assessment:   58 yo female admitted s/p lap chole. PTA lived at home with spouse.     Action/Plan:   Home when stable   Anticipated DC Date:  05/26/2014   Anticipated DC Plan:  [place]  CM consult      Choice offered to / List presented to:             Status of service:  Completed, signed off Medicare Important Message given?  NO (If response is "NO", the following Medicare IM given date fields will be blank) Date Medicare IM given:   Date Additional Medicare IM given:    Discharge Disposition:  HOME/SELF CARE  Per UR Regulation:  Reviewed for med. necessity/level of care/duration of stay  If discussed at [place] of Stay Meetings, dates discussed:    Comments:

## 2014-05-26 NOTE — Progress Notes (Signed)
Utilization review completed.  

## 2014-05-26 NOTE — Progress Notes (Signed)
1 Day Post-Op  Subjective: Tolerating soft diet.  Passing gas.  Voiding well. Wants to go home.  Objective: Vital signs in last 24 hours: Temp:  [97 F (36.1 C)-98.4 F (36.9 C)] 97.6 F (36.4 C) (06/26 1000) Pulse Rate:  [82-105] 82 (06/26 1000) Resp:  [12-18] 18 (06/26 1000) BP: (91-127)/(44-75) 120/72 mmHg (06/26 1000) SpO2:  [87 %-94 %] 92 % (06/26 1000) Weight:  [308 lb (139.708 kg)] 308 lb (139.708 kg) (06/25 1820) Last BM Date: 05/24/14  Intake/Output from previous day: 06/25 0701 - 06/26 0700 In: 2752.5 [P.O.:360; I.V.:2392.5] Out: 760 [Urine:750; Blood:10] Intake/Output this shift: Total I/O In: -  Out: 700 [Urine:700]  PE: General- In NAD Abd-soft, dressings dry. Abdomen-  Lab Results:  No results found for this basename: WBC, HGB, HCT, PLT,  in the last 72 hours BMET No results found for this basename: NA, K, CL, CO2, GLUCOSE, BUN, CREATININE, CALCIUM,  in the last 72 hours PT/INR No results found for this basename: LABPROT, INR,  in the last 72 hours Comprehensive Metabolic Panel:    Component Value Date/Time   NA 141 05/17/2014 1410   NA 141 04/26/2014 1038   K 4.6 05/17/2014 1410   K 4.5 04/26/2014 1038   CL 100 05/17/2014 1410   CL 101 04/26/2014 1038   CO2 27 05/17/2014 1410   CO2 23 04/26/2014 1038   BUN 14 05/17/2014 1410   BUN 10 04/26/2014 1038   CREATININE 0.69 05/17/2014 1410   CREATININE 0.65 04/26/2014 1038   GLUCOSE 83 05/17/2014 1410   GLUCOSE 113* 04/26/2014 1038   CALCIUM 10.0 05/17/2014 1410   CALCIUM 10.1 04/26/2014 1038   AST 19 05/17/2014 1410   AST 24 04/26/2014 1038   ALT 22 05/17/2014 1410   ALT 23 04/26/2014 1038   ALKPHOS 57 05/17/2014 1410   ALKPHOS 56 04/26/2014 1038   BILITOT 0.3 05/17/2014 1410   BILITOT 0.3 04/26/2014 1038   PROT 8.2 05/17/2014 1410   PROT 8.1 04/26/2014 1038   ALBUMIN 3.6 05/17/2014 1410   ALBUMIN 3.8 04/26/2014 1038     Studies/Results: Dg Cholangiogram Operative  05/25/2014   CLINICAL DATA:  Chronic  cholecystitis  EXAM: INTRAOPERATIVE CHOLANGIOGRAM  TECHNIQUE: Cholangiographic images from the C-arm fluoroscopic device were submitted for interpretation post-operatively. Please see the procedural report for the amount of contrast and the fluoroscopy time utilized.  COMPARISON:  06/28/2013  FINDINGS: Intraoperative cholangiogram performed during the laparoscopic cholecystectomy. Biliary confluence, common hepatic duct, residual cystic duct, and common bile duct are all patent. No biliary dilatation, obstruction or filling defect. Contrast opacifies duodenum.  IMPRESSION: Patent biliary system.   Electronically Signed   By: Daryll Brod M.D.   On: 05/25/2014 14:22    Anti-infectives: Anti-infectives   Start     Dose/Rate Route Frequency Ordered Stop   05/25/14 0944  ceFAZolin (ANCEF) 3 g in dextrose 5 % 50 mL IVPB  Status:  Discontinued     3 g 160 mL/hr over 30 Minutes Intravenous On call to O.R. 05/25/14 4315 05/25/14 0948   05/25/14 0943  ceFAZolin (ANCEF) 3 g in dextrose 5 % 50 mL IVPB  Status:  Discontinued     3 g 160 mL/hr over 30 Minutes Intravenous On call to O.R. 05/25/14 4008 05/25/14 0948   05/25/14 0600  ceFAZolin (ANCEF) 3 g in dextrose 5 % 50 mL IVPB     3 g 160 mL/hr over 30 Minutes Intravenous On call to O.R. 05/24/14 1354 05/25/14 1157  Assessment Principal Problem:   Chronic cholecystitis s/p lap chole with IOC 05/25/14-doing well    LOS: 1 day   Plan:  Discharge.  Instructions given.   ROSENBOWER,TODD Lenna Sciara 05/26/2014

## 2014-05-28 NOTE — Discharge Summary (Signed)
Physician Discharge Summary  Patient ID: Denise Case MRN: [id number] DOB/AGE: 03-27-1956 58 y.o.  Admit date: 05/25/2014 Discharge date: 05/26/2014  Admission Diagnoses:  Chronic cholecystitis  Discharge Diagnoses:  Principal Problem:   Chronic cholecystitis s/p lap chole with [facility] 05/25/14   Discharged Condition: good  Hospital Course: General a laparoscopic cholecystectomy and tolerated it well. She is to be discharged on postoperative day #1. Discharge instructions were given to her and her husband.  Consults: None  Significant Diagnostic Studies: none  Treatments: surgery: Laparoscopic cholecystectomy with cholangiogram  Discharge Exam: Blood pressure 120/72, pulse 82, temperature 97.6 F (36.4 C), temperature source Axillary, resp. rate 18, height 5\' 6"  (1.676 m), weight 308 lb (139.708 kg), SpO2 92.00%.   Disposition: 01-Home or Self Care     Medication List         ALPRAZolam 1 MG tablet  Commonly known as:  XANAX  Take 1 mg by mouth 3 (three) times daily as needed for anxiety.     amitriptyline 25 MG tablet  Commonly known as:  ELAVIL  Take 25 mg by mouth at bedtime.     CALCIUM-VITAMIN D PO  Take 1 tablet by mouth daily.     Cinnamon 500 MG capsule  Take 500 mg by mouth 2 (two) times daily.     cyanocobalamin 1000 MCG tablet  Take 1,000 mcg by mouth daily.     cyclobenzaprine 10 MG tablet  Commonly known as:  FLEXERIL  Take 10 mg by mouth 3 (three) times daily as needed for muscle spasms.     DEXILANT 60 MG capsule  Generic drug:  dexlansoprazole  Take 60 mg by mouth daily.     ergocalciferol 50000 UNITS capsule  Commonly known as:  VITAMIN D2  Take 50,000 Units by mouth once a week. Friday     fish oil-omega-3 fatty acids 1000 MG capsule  Take 1 g by mouth 3 (three) times daily with meals.     flurazepam 30 MG capsule  Commonly known as:  DALMANE  Take 30 mg by mouth at bedtime.     gabapentin 800 MG tablet  Commonly known as:   NEURONTIN  Take 400-800 mg by mouth 3 (three) times daily. 0.5 tablet (400 mg) in the morning and then 0.5 tablet (400 mg) 8 hours later and 1 tablet (800mg ) at bedtime.     Garlic 0175 MG Caps  Take 1,000 mg by mouth daily.     methocarbamol 500 MG tablet  Commonly known as:  ROBAXIN  Take 500 mg by mouth 4 (four) times daily as needed for muscle spasms.     MINERAL ICE 2 % Gel  Generic drug:  Menthol (Topical Analgesic)  Apply 1 application topically as needed (back and feet pain).     mineral oil liquid  Take 15-30 mLs by mouth at bedtime. Constipation     NUCYNTA ER 100 MG Tb12  Generic drug:  Tapentadol HCl  Take 100 mg by mouth 3 (three) times daily.     ondansetron 4 MG tablet  Commonly known as:  ZOFRAN  Take 1 tablet (4 mg total) by mouth every 4 (four) hours as needed for nausea or vomiting.     OVER THE COUNTER MEDICATION  Take 1 tablet by mouth 2 (two) times daily with a meal. Garcinia cambogia (weight loss supplement)     RED YEAST RICE PO  Take 1,000 mg by mouth daily before breakfast.     SYNTHROID 200 MCG tablet  Generic drug:  levothyroxine  Take 200 mcg by mouth daily before breakfast.     theophylline 300 MG 24 hr capsule  Commonly known as:  THEO-24  Take 300 mg by mouth 2 (two) times daily.     traMADol 50 MG tablet  Commonly known as:  ULTRAM  Take 1-2 tablets (50-100 mg total) by mouth every 6 (six) hours as needed.     vitamin C 500 MG tablet  Commonly known as:  ASCORBIC ACID  Take 500 mg by mouth daily.     Zinc 50 MG Tabs  Take 50 mg by mouth daily.         Signed: Odis Hollingshead 05/28/2014, 3:03 PM

## 2014-05-29 ENCOUNTER — Telehealth (INDEPENDENT_AMBULATORY_CARE_PROVIDER_SITE_OTHER): Payer: Self-pay | Admitting: *Deleted

## 2014-05-29 NOTE — Telephone Encounter (Signed)
Pt called complaining of swelling in both lower extremities.  She is s/p lap chole 05-25-14.  Pt states that there is no pain, just tingling where they are swollen so tight.  With concerns regarding a blood clot, I went to Dr. Donne Hazel and asked for his recommendation.  Per Dr. Donne Hazel, he would not start with DVT, but he wanted her PCP to see her ASAP to be checked for bilateral lower extremity edema after lap chole.  I called pt back and she informed that she saw her PCP this a.m but due to a bill that was owed to their office (Dr. Rex Kras - [facility]) that he would only check her left lung for Bronchitis.  I advised pt I would call Dr. Eddie Dibbles office with the pt's concern and I advised Claiborne Billings that Dr. Donne Hazel said pt needed to be seen ASAP.  Claiborne Billings assured me that she would give the pt a call and get her in for an appt.  I called the pt back and made her aware of what was taken place and advised pt if she hadn't heard from Dr. Eddie Dibbles office to call us back and we would see what we could do.  Pt verbalized understanding.  Anderson Malta

## 2014-05-29 NOTE — Telephone Encounter (Signed)
Pt called back wanting to advise Jearld Fenton that she has appt tomorrow at 2:15pm with Dr Rex Kras her PCP. Pt advised I will attach this to last msg and forward this to [place] per pt request.

## 2014-05-30 NOTE — Telephone Encounter (Signed)
Noted  

## 2014-06-12 ENCOUNTER — Telehealth (INDEPENDENT_AMBULATORY_CARE_PROVIDER_SITE_OTHER): Payer: Self-pay

## 2014-06-12 ENCOUNTER — Ambulatory Visit (INDEPENDENT_AMBULATORY_CARE_PROVIDER_SITE_OTHER): Payer: 59 | Admitting: General Surgery

## 2014-06-12 ENCOUNTER — Encounter (INDEPENDENT_AMBULATORY_CARE_PROVIDER_SITE_OTHER): Payer: Self-pay | Admitting: General Surgery

## 2014-06-12 VITALS — BP 130/80 | HR 96 | Resp 16 | Ht 66.0 in | Wt 309.8 lb

## 2014-06-12 DIAGNOSIS — Z4889 Encounter for other specified surgical aftercare: Secondary | ICD-10-CM

## 2014-06-12 NOTE — Progress Notes (Signed)
Procedure:  Laparoscopic cholecystectomy with cholangiogram  Date:  05/25/2014   Pathology:  Chronic cholecystitis  History:  She is here for her first postoperative visit. She has no further abdominal complaints. She has had some postprandial diarrhea but this is improving.  Exam: General- Is in NAD. Abdomen-incisions are clean and intact.  Assessment:  Symptoms are improved following laparoscopic cholecystectomy. Having some post prandial diarrhea which is improving.  Plan:  Low fat diet recommended. Return visit prn.

## 2014-06-12 NOTE — Telephone Encounter (Signed)
LMOV pt has appt for staple removal on 06/16/14 at 10 a.m.  Also, remind her of her post op appt with Dr. Zella Richer on 06/19/14.

## 2014-06-12 NOTE — Patient Instructions (Addendum)
Low fat diet recommended. May get into a swimming pool in 1 to 2 weeks.

## 2014-07-13 ENCOUNTER — Emergency Department (HOSPITAL_COMMUNITY)
Admission: EM | Admit: 2014-07-13 | Discharge: 2014-07-13 | Disposition: A | Discharge status: Home / Self Care | Payer: 59 | Attending: Emergency Medicine | Admitting: Emergency Medicine

## 2014-07-13 ENCOUNTER — Encounter (HOSPITAL_COMMUNITY): Payer: Self-pay | Admitting: Emergency Medicine

## 2014-07-13 DIAGNOSIS — Z87828 Personal history of other (healed) physical injury and trauma: Secondary | ICD-10-CM | POA: Insufficient documentation

## 2014-07-13 DIAGNOSIS — Z87442 Personal history of urinary calculi: Secondary | ICD-10-CM | POA: Insufficient documentation

## 2014-07-13 DIAGNOSIS — IMO0001 Reserved for inherently not codable concepts without codable children: Secondary | ICD-10-CM | POA: Insufficient documentation

## 2014-07-13 DIAGNOSIS — K219 Gastro-esophageal reflux disease without esophagitis: Secondary | ICD-10-CM | POA: Diagnosis not present

## 2014-07-13 DIAGNOSIS — J449 Chronic obstructive pulmonary disease, unspecified: Secondary | ICD-10-CM | POA: Insufficient documentation

## 2014-07-13 DIAGNOSIS — Z8701 Personal history of pneumonia (recurrent): Secondary | ICD-10-CM | POA: Insufficient documentation

## 2014-07-13 DIAGNOSIS — G43909 Migraine, unspecified, not intractable, without status migrainosus: Secondary | ICD-10-CM | POA: Diagnosis not present

## 2014-07-13 DIAGNOSIS — R404 Transient alteration of awareness: Secondary | ICD-10-CM | POA: Insufficient documentation

## 2014-07-13 DIAGNOSIS — Z8669 Personal history of other diseases of the nervous system and sense organs: Secondary | ICD-10-CM | POA: Diagnosis not present

## 2014-07-13 DIAGNOSIS — F17200 Nicotine dependence, unspecified, uncomplicated: Secondary | ICD-10-CM | POA: Insufficient documentation

## 2014-07-13 DIAGNOSIS — F411 Generalized anxiety disorder: Secondary | ICD-10-CM | POA: Insufficient documentation

## 2014-07-13 DIAGNOSIS — R55 Syncope and collapse: Secondary | ICD-10-CM | POA: Insufficient documentation

## 2014-07-13 DIAGNOSIS — F3289 Other specified depressive episodes: Secondary | ICD-10-CM | POA: Diagnosis not present

## 2014-07-13 DIAGNOSIS — E119 Type 2 diabetes mellitus without complications: Secondary | ICD-10-CM | POA: Insufficient documentation

## 2014-07-13 DIAGNOSIS — Z79899 Other long term (current) drug therapy: Secondary | ICD-10-CM | POA: Insufficient documentation

## 2014-07-13 DIAGNOSIS — Z872 Personal history of diseases of the skin and subcutaneous tissue: Secondary | ICD-10-CM | POA: Insufficient documentation

## 2014-07-13 DIAGNOSIS — F329 Major depressive disorder, single episode, unspecified: Secondary | ICD-10-CM | POA: Insufficient documentation

## 2014-07-13 DIAGNOSIS — J4489 Other specified chronic obstructive pulmonary disease: Secondary | ICD-10-CM | POA: Insufficient documentation

## 2014-07-13 LAB — BASIC METABOLIC PANEL
ANION GAP: 15 (ref 5–15)
BUN: 17 mg/dL (ref 6–23)
CALCIUM: 9.6 mg/dL (ref 8.4–10.5)
CO2: 23 meq/L (ref 19–32)
CREATININE: 0.73 mg/dL (ref 0.50–1.10)
Chloride: 101 mEq/L (ref 96–112)
GFR calc Af Amer: >90 mL/min (ref >90)
Glucose, Bld: 125 mg/dL — ABNORMAL HIGH (ref 70–99)
Potassium: 4.4 mEq/L (ref 3.7–5.3)
SODIUM: 139 meq/L (ref 137–147)

## 2014-07-13 LAB — CBC
HCT: 44.2 % (ref 36.0–46.0)
Hemoglobin: 14.2 g/dL (ref 12.0–15.0)
MCH: 29.6 pg (ref 26.0–34.0)
MCHC: 32.1 g/dL (ref 30.0–36.0)
MCV: 92.3 fL (ref 78.0–100.0)
Platelets: 267 10*3/uL (ref 150–400)
RBC: 4.79 MIL/uL (ref 3.87–5.11)
RDW: 14 % (ref 11.5–15.5)
WBC: 13.9 10*3/uL — ABNORMAL HIGH (ref 4.0–10.5)

## 2014-07-13 LAB — I-STAT TROPONIN, ED: Troponin i, poc: 0.01 ng/mL (ref 0.00–0.08)

## 2014-07-13 LAB — CBG MONITORING, ED: Glucose-Capillary: 126 mg/dL — ABNORMAL HIGH (ref 70–99)

## 2014-07-13 NOTE — ED Provider Notes (Signed)
CSN: [id number]     Arrival date & time 07/13/14  0024 History   First MD Initiated Contact with Patient 07/13/14 [id number]     Chief Complaint  Patient presents with  . Loss of Consciousness  . Fall     (Consider location/radiation/quality/duration/timing/severity/associated sxs/prior Treatment) HPI Denise Case is a 58 y.o. female who presents to ED with complaint possible syncopal episode. Patient states that she remembers watching TV at the kitchen table last night, and then she woke up on the floor. She states she does remember feeling very tired when she was watching TV, because she spent all day swimming with her grandchildren a pool. She states she's not sure if she fell asleep or if she had a syncopal episode. Patient states she was alone in the house when it happened. She states she called out for help but then realized there is nobody at home so she got up and called her husband who was at work. Patient states she fell onto the right side, and is having pain in her right hand and right hip. She states she is able to ambulate and move all the joints. She currently denies any complaints including no headache, no dizziness, no changes in vision, no nausea or vomiting, no chest pain or shortness of breath. She states she did take her evening medicines before coming to the emergency department which includes some of the sedated medications and currently states she feels very sleepy and just wants to go home. Patient does have multiple medical problems including fibromyalgia, COPD, diabetes, migraines, anxiety, but denies any cardiac history or history of syncopal episodes or seizures in the past. There is no urinary incontinence or injury to the tongue.  Past Medical History  Diagnosis Date  . COPD (chronic obstructive pulmonary disease)   . Fibromyalgia   . Thyroid disease   . Morbid obesity   . GERD (gastroesophageal reflux disease)   . Hyperlipidemia   . Asthma   . Diverticulitis   .  Lumbar sprain   . Diabetes mellitus without complication   . Pneumonia 2015  . Hypothyroidism   . Migraine   . Depression   . Anxiety   . History of kidney stones   . Constipation due to pain medication   . Requires supplemental oxygen     PRN uses 1.5 Liters  . Neuropathy     feet and legs  . Tingling sensation     hands  . Dermatitis   . History of panic attacks    Past Surgical History  Procedure Laterality Date  . Bladder tack      X 2  . Abdominal hysterectomy    . Cholecystectomy N/A 05/25/2014    Procedure: LAPAROSCOPIC CHOLECYSTECTOMY WITH INTRAOPERATIVE CHOLANGIOGRAM;  Surgeon: Odis Hollingshead, MD;  Location: [facility];  Service: General;  Laterality: N/A;   No family history on file. History  Substance Use Topics  . Smoking status: Current Every Day Smoker -- 1.00 packs/day for 37 years    Types: Cigarettes  . Smokeless tobacco: Never Used  . Alcohol Use: No   OB History   Grav Para Term Preterm Abortions TAB SAB Ect Mult Living                 Review of Systems  Constitutional: Negative for fever and chills.  Respiratory: Negative for cough, chest tightness and shortness of breath.   Cardiovascular: Negative for chest pain, palpitations and leg swelling.  Gastrointestinal: Negative for nausea,  vomiting, abdominal pain and diarrhea.  Genitourinary: Negative for dysuria, flank pain and pelvic pain.  Musculoskeletal: Negative for arthralgias, myalgias, neck pain and neck stiffness.  Skin: Negative for rash.  Neurological: Positive for syncope. Negative for dizziness, weakness and headaches.  All other systems reviewed and are negative.     Allergies  Montelukast sodium; Doxycycline; and Hydrocodone-acetaminophen  Home Medications   Prior to Admission medications   Medication Sig Start Date End Date Taking? Authorizing Provider  ALPRAZolam Duanne Moron) 1 MG tablet Take 1 mg by mouth 3 (three) times daily as needed for anxiety.    Yes Historical Provider, MD   amitriptyline (ELAVIL) 25 MG tablet Take 25 mg by mouth at bedtime.   Yes Historical Provider, MD  CALCIUM-VITAMIN D PO Take 1 tablet by mouth daily.   Yes Historical Provider, MD  Cinnamon 500 MG capsule Take 500 mg by mouth 2 (two) times daily.   Yes Historical Provider, MD  cyanocobalamin 1000 MCG tablet Take 1,000 mcg by mouth daily.    Yes Historical Provider, MD  cyclobenzaprine (FLEXERIL) 10 MG tablet Take 10 mg by mouth 3 (three) times daily as needed for muscle spasms.   Yes Historical Provider, MD  dexlansoprazole (DEXILANT) 60 MG capsule Take 60 mg by mouth daily.   Yes Historical Provider, MD  ergocalciferol (VITAMIN D2) 50000 UNITS capsule Take 50,000 Units by mouth once a week. Friday   Yes Historical Provider, MD  fish oil-omega-3 fatty acids 1000 MG capsule Take 1 g by mouth 3 (three) times daily with meals.    Yes Historical Provider, MD  flurazepam (DALMANE) 30 MG capsule Take 30 mg by mouth at bedtime.    Yes Historical Provider, MD  gabapentin (NEURONTIN) 800 MG tablet Take 400-800 mg by mouth 3 (three) times daily. 0.5 tablet (400 mg) in the morning and then 0.5 tablet (400 mg) 8 hours later and 1 tablet (800mg ) at bedtime.   Yes Historical Provider, MD  Garlic 7371 MG CAPS Take 1,000 mg by mouth daily.    Yes Historical Provider, MD  levothyroxine (SYNTHROID) 200 MCG tablet Take 200 mcg by mouth daily before breakfast.    Yes Historical Provider, MD  Menthol, Topical Analgesic, (MINERAL ICE) 2 % GEL Apply 1 application topically as needed (back and feet pain).   Yes Historical Provider, MD  methocarbamol (ROBAXIN) 500 MG tablet Take 500 mg by mouth 4 (four) times daily as needed for muscle spasms.   Yes Historical Provider, MD  mineral oil liquid Take 15-30 mLs by mouth at bedtime. Constipation   Yes Historical Provider, MD  ondansetron (ZOFRAN) 4 MG tablet Take 1 tablet (4 mg total) by mouth every 4 (four) hours as needed for nausea or vomiting. 05/04/14  Yes Odis Hollingshead,  MD  OVER THE COUNTER MEDICATION Take 1 tablet by mouth 2 (two) times daily with a meal. Garcinia cambogia (weight loss supplement)   Yes Historical Provider, MD  Red Yeast Rice Extract (RED YEAST RICE PO) Take 1,000 mg by mouth daily before breakfast.   Yes Historical Provider, MD  Tapentadol HCl (NUCYNTA ER) 100 MG TB12 Take 100 mg by mouth 3 (three) times daily.    Yes Historical Provider, MD  theophylline (THEO-24) 300 MG 24 hr capsule Take 300 mg by mouth 2 (two) times daily.    Yes Historical Provider, MD  traMADol (ULTRAM) 50 MG tablet Take 1-2 tablets (50-100 mg total) by mouth every 6 (six) hours as needed. 05/26/14  Yes Rhunette Croft  Rosenbower, MD  vitamin C (ASCORBIC ACID) 500 MG tablet Take 500 mg by mouth daily.   Yes Historical Provider, MD  Zinc 50 MG TABS Take 50 mg by mouth daily.   Yes Historical Provider, MD   BP 109/63  Pulse 84  Temp(Src) 98 F (36.7 C) (Oral)  Resp 20  Ht 5\' 6"  (1.676 m)  Wt 313 lb (141.976 kg)  BMI 50.54 kg/m2  SpO2 91% Physical Exam  Nursing note and vitals reviewed. Constitutional: She is oriented to person, place, and time. She appears well-developed and well-nourished. No distress.  Patient appears to be very sedated  HENT:  Head: Normocephalic.  Eyes: Conjunctivae and EOM are normal. Pupils are equal, round, and reactive to light.  Neck: Normal range of motion. Neck supple.  Cardiovascular: Normal rate, regular rhythm and normal heart sounds.   Pulmonary/Chest: Effort normal and breath sounds normal. No respiratory distress. She has no wheezes. She has no rales.  Abdominal: Soft. Bowel sounds are normal. She exhibits no distension. There is no tenderness. There is no rebound.  Musculoskeletal: She exhibits no edema.  Neurological: She is alert and oriented to person, place, and time. No cranial nerve deficit. Coordination normal.  5/5 and equal upper and lower extremity strength bilaterally. Equal grip strength bilaterally. Normal finger to nose and  heel to shin. No pronator drift. Gait slow, but normal, no ataxia  Skin: Skin is warm and dry.  Psychiatric: She has a normal mood and affect. Her behavior is normal.    ED Course  Procedures (including critical care time) Labs Review Labs Reviewed  CBC - Abnormal; Notable for the following:    WBC 13.9 (*)    All other components within normal limits  BASIC METABOLIC PANEL - Abnormal; Notable for the following:    Glucose, Bld 125 (*)    All other components within normal limits  CBG MONITORING, ED - Abnormal; Notable for the following:    Glucose-Capillary 126 (*)    All other components within normal limits  I-STAT TROPOININ, ED    Imaging Review No results found.   EKG Interpretation   Date/Time:  Thursday July 13 2014 04:24:19 EDT Ventricular Rate:  83 PR Interval:  196 QRS Duration: 79 QT Interval:  382 QTC Calculation: 449 R Axis:   70 Text Interpretation:  Sinus rhythm Confirmed by NANAVATI, MD, Thelma Comp  [phone]) on 07/13/2014 6:53:40 AM      MDM   Final diagnoses:  Syncope, unspecified syncope type    Pt with syncopal episode vs possibly fell asleep while watching TV at a table. Pt has no obvious injuries from the fall. Doubt seizure, no hx, no post ictal state, no bladder incontinence, no tongue injury. Pt is sedated here, states she took her night time meds. Pt requesting to leave. ECG is normal. Labs unremarkable other than slightly elevated WBC. Pt asymptomatic at this time. Neurological exam normal. Pt ambulated in the hallway. She states she is very tired, she has now been up for 27hrs with no sleep and she is requesting to be discharged. It is still unclear if she had a syncopal episode, but i do think based on hx and current exam that she may have fallen asleep. Will d/c home, instructed to follow up with pcp.   Filed Vitals:   07/13/14 0430 07/13/14 0600 07/13/14 0630 07/13/14 0648  BP: 117/61 122/71 125/58   Pulse: 83     Temp:    98.6 F (37 C)  TempSrc:    Oral  Resp: 18 18 20    Height:      Weight:      SpO2: 91%   93%       Renold Genta, PA-C 07/13/14 [phone]

## 2014-07-13 NOTE — ED Notes (Addendum)
Pt was at home watching TV at 2115. At 2000, pt woke up in the floor. Pt remembers nothing from this time. Pt is tired upon assessment but states that she spent the day swimming with her grandkids. Pt has no hx of seizures or syncope. Pt is c/o pain on her rt side from falling out of the chair.

## 2014-07-13 NOTE — Discharge Instructions (Signed)
Your blood work is normal today. Make sure to get plenty of rest. Please follow up with primary care doctor for re check and further testing as soon as able. Return if any more syncopal episodes.    Syncope Syncope means a person passes out (faints). The person usually wakes up in less than 5 minutes. It is important to seek medical care for syncope. HOME CARE  Have someone stay with you until you feel normal.  Do not drive, use machines, or play sports until your doctor says it is okay.  Keep all doctor visits as told.  Lie down when you feel like you might pass out. Take deep breaths. Wait until you feel normal before standing up.  Drink enough fluids to keep your pee (urine) clear or pale yellow.  If you take blood pressure or heart medicine, get up slowly. Take several minutes to sit and then stand. GET HELP RIGHT AWAY IF:   You have a severe headache.  You have pain in the chest, belly (abdomen), or back.  You are bleeding from the mouth or butt (rectum).  You have black or tarry poop (stool).  You have an irregular or very fast heartbeat.  You have pain with breathing.  You keep passing out, or you have shaking (seizures) when you pass out.  You pass out when sitting or lying down.  You feel confused.  You have trouble walking.  You have severe weakness.  You have vision problems. If you fainted, call your local emergency services (911 in [place] Do not drive yourself to the hospital. MAKE SURE YOU:   Understand these instructions.  Will watch your condition.  Will get help right away if you are not doing well or get worse. Document Released: 05/05/2008 Document Revised: 05/18/2012 Document Reviewed: 01/16/2012 [facility] Patient Information 2015 [place], [place]. This information is not intended to replace advice given to you by your health care provider. Make sure you discuss any questions you have with your health care provider.

## 2014-07-13 NOTE — ED Notes (Signed)
Pt able to get out of bed and ambulated without assistance--- pt stated, "I'm just tired".

## 2014-07-13 NOTE — ED Notes (Signed)
EKG given to EDP,Nanavati,MD. For review. 

## 2014-07-14 NOTE — ED Provider Notes (Signed)
Medical screening examination/treatment/procedure(s) were performed by non-physician practitioner and as supervising physician I was immediately available for consultation/collaboration.   EKG Interpretation   Date/Time:  Thursday July 13 2014 04:24:19 EDT Ventricular Rate:  83 PR Interval:  196 QRS Duration: 79 QT Interval:  382 QTC Calculation: 449 R Axis:   70 Text Interpretation:  Sinus rhythm Confirmed by Kathrynn Humble, MD, Truly Stankiewicz  ([id number]) on 07/13/2014 6:53:40 AM       Varney Biles, MD 07/14/14 0930

## 2014-08-25 ENCOUNTER — Ambulatory Visit (INDEPENDENT_AMBULATORY_CARE_PROVIDER_SITE_OTHER): Payer: 59

## 2014-08-25 DIAGNOSIS — R52 Pain, unspecified: Secondary | ICD-10-CM

## 2014-08-25 DIAGNOSIS — S93409A Sprain of unspecified ligament of unspecified ankle, initial encounter: Secondary | ICD-10-CM

## 2014-08-25 DIAGNOSIS — R609 Edema, unspecified: Secondary | ICD-10-CM

## 2014-08-25 DIAGNOSIS — M7752 Other enthesopathy of left foot: Secondary | ICD-10-CM

## 2014-08-25 DIAGNOSIS — M7750 Other enthesopathy of unspecified foot: Secondary | ICD-10-CM

## 2014-08-25 NOTE — Progress Notes (Signed)
   Subjective:    Patient ID: Denise Case, female    DOB: 1955/12/18, 58 y.o.   MRN: [id number]  HPI  NEW PROBLEM:  PT STATED INJURED LT FOOT 3 DAYS AGO AND STILL PAINFUL AND HAVE SWOLLEN. THE FOOT IS BEEN THE SAME BUT NOT WORSE. THE FOOT GET AGGRAVATED BY PRESSURE AND WALKING. TIRED COLD WATER AND ROBAXIN FOR PAIN.   Review of Systems  Musculoskeletal: Positive for gait problem.  All other systems reviewed and are negative.      Objective:   Physical Exam 58 year old white female well-developed well-nourished somewhat overweight oriented x3 presents this time a 3 day history of left ankle injury lateral ankle area. First and foot but when examined it's actually focal to the lateral malleolar area in the anterior sinus tarsi anterior talofibular ligament and calcaneal fib ligament region. Lower extremity objective findings as follows vascular status reveals pedal pulses intact DP +2 for PT one over 4 mild + edema bilateral neurologically epicritic and proprioceptive sensations intact and symmetric normal plantar response noted DTRs not elicited dermatologically skin color pigment normal hair growth absent nails somewhat criptotic orthopedic exam right foot is unremarkable left foot is intact wrist flexion plantar flexion however and plantar flexion inversion exquisite painful tender and symptomatic any palpation around the lateral heel area tender and no pain and digital range of motion or on forefoot palpation or midfoot palpation medial malleolus is nonpainful symptomatic cover the sinus tarsi and anterior ankle gutter on the lateral side is painful symptomatic as is and calcaneal fib ligament distribution to x-rays AP lateral oblique of the foot and 3 views of the ankle are reviewed no signs of fracture or osseous abnormality intact joint spaces no cysts no tumors or lesions mild arthritic changes noted of the foot on lateral projections. No open wounds no other complications noted         Assessment & Plan:  Assessment this time based on clinical and radiographic finding ankle sprain lateral left ankle likely involving the anterior talofibular possible calcaneal fib ligaments. Second degree sprain will be immobilized the ankle stabilizer at this time utilizing Velcro wrap maintain ice maintain current pain regimen she is oriented taking multiple medicines for pain. Elevated ice when possible maintain stabilizer all times when ambulating maintaining good athletic or walking shoe as well no avoid going barefoot or flimsy shoes or flip-flops. Recheck in 4 weeks for followup and reevaluation and possible additional x-rays or additional conservative care based on progress  Harriet Masson DPM

## 2014-08-25 NOTE — Patient Instructions (Signed)
Ankle Sprain An ankle sprain is an injury to the strong, fibrous tissues (ligaments) that hold the bones of your ankle joint together.  CAUSES An ankle sprain is usually caused by a fall or by twisting your ankle. Ankle sprains most commonly occur when you step on the outer edge of your foot, and your ankle turns inward. People who participate in sports are more prone to these types of injuries.  SYMPTOMS   Pain in your ankle. The pain may be present at rest or only when you are trying to stand or walk.  Swelling.  Bruising. Bruising may develop immediately or within 1 to 2 days after your injury.  Difficulty standing or walking, particularly when turning corners or changing directions. DIAGNOSIS  Your caregiver will ask you details about your injury and perform a physical exam of your ankle to determine if you have an ankle sprain. During the physical exam, your caregiver will press on and apply pressure to specific areas of your foot and ankle. Your caregiver will try to move your ankle in certain ways. An X-ray exam may be done to be sure a bone was not broken or a ligament did not separate from one of the bones in your ankle (avulsion fracture).  TREATMENT  Certain types of braces can help stabilize your ankle. Your caregiver can make a recommendation for this. Your caregiver may recommend the use of medicine for pain. If your sprain is severe, your caregiver may refer you to a surgeon who helps to restore function to parts of your skeletal system (orthopedist) or a physical therapist. [place] ice to your injury for 1-2 days or as directed by your caregiver. Applying ice helps to reduce inflammation and pain.  Put ice in a plastic bag.  Place a towel between your skin and the bag.  Leave the ice on for 15-20 minutes at a time, every 2 hours while you are awake.  Only take over-the-counter or prescription medicines for pain, discomfort, or fever as directed  by your caregiver.  Elevate your injured ankle above the level of your heart as much as possible for 2-3 days.  If your caregiver recommends crutches, use them as instructed. Gradually put weight on the affected ankle. Continue to use crutches or a cane until you can walk without feeling pain in your ankle.  If you have a plaster splint, wear the splint as directed by your caregiver. Do not rest it on anything harder than a pillow for the first 24 hours. Do not put weight on it. Do not get it wet. You may take it off to take a shower or bath.  You may have been given an elastic bandage to wear around your ankle to provide support. If the elastic bandage is too tight (you have numbness or tingling in your foot or your foot becomes cold and blue), adjust the bandage to make it comfortable.  If you have an air splint, you may blow more air into it or let air out to make it more comfortable. You may take your splint off at night and before taking a shower or bath. Wiggle your toes in the splint several times per day to decrease swelling. SEEK MEDICAL CARE IF:   You have rapidly increasing bruising or swelling.  Your toes feel extremely cold or you lose feeling in your foot.  Your pain is not relieved with medicine. SEEK IMMEDIATE MEDICAL CARE IF:  Your toes are numb  or blue.  You have severe pain that is increasing. MAKE SURE YOU:   Understand these instructions.  Will watch your condition.  Will get help right away if you are not doing well or get worse. Document Released: 11/17/2005 Document Revised: 08/11/2012 Document Reviewed: 11/29/2011 [facility] Patient Information 2015 [place], [place]. This information is not intended to replace advice given to you by your health care provider. Make sure you discuss any questions you have with your health care provider. ICE INSTRUCTIONS  Apply ice or cold pack to the affected area at least 3 times a day for 10-15 minutes each time.  You should  also use ice after prolonged activity or vigorous exercise.  Do not apply ice longer than 20 minutes at one time.  Always keep a cloth between your skin and the ice pack to prevent burns.  Being consistent and following these instructions will help control your symptoms.  We suggest you purchase a gel ice pack because they are reusable and do bit leak.  Some of them are designed to wrap around the area.  Use the method that works best for you.  Here are some other suggestions for icing.   Use a frozen bag of peas or corn-inexpensive and molds well to your body, usually stays frozen for 10 to 20 minutes.  Wet a towel with cold water and squeeze out the excess until it's damp.  Place in a bag in the freezer for 20 minutes. Then remove and use.

## 2014-09-26 ENCOUNTER — Ambulatory Visit (INDEPENDENT_AMBULATORY_CARE_PROVIDER_SITE_OTHER): Payer: 59

## 2014-09-26 VITALS — BP 135/80 | HR 89 | Resp 17

## 2014-09-26 DIAGNOSIS — M79662 Pain in left lower leg: Secondary | ICD-10-CM

## 2014-09-26 DIAGNOSIS — M722 Plantar fascial fibromatosis: Secondary | ICD-10-CM

## 2014-09-26 DIAGNOSIS — R609 Edema, unspecified: Secondary | ICD-10-CM

## 2014-09-26 DIAGNOSIS — M7752 Other enthesopathy of left foot: Secondary | ICD-10-CM

## 2014-09-26 DIAGNOSIS — G629 Polyneuropathy, unspecified: Secondary | ICD-10-CM

## 2014-09-26 NOTE — Patient Instructions (Signed)

## 2014-09-26 NOTE — Progress Notes (Signed)
   Subjective:    Patient ID: Denise Case, female    DOB: 30-Jun-1956, 58 y.o.   MRN: [id number]  HPI Comments: Pt states she continues to have pain in the left ankle, but has had some improvement.  Pt states Dr. Rex Kras discontinued the Robaxin, and Nycenta.  Pt states she would like to discuss taping the bottom of her feet to see if it would help the neuropathy.     Review of Systems no new findings or systemic changes noted    Objective:   Physical Exam Neurovascular status intact patient continues to have paresthesias neuropathy O malleoli area of the left ankle is improved and wear the brace for wellness doing much better since the ankle sprain or strain or continues to have burning stinging feet consistent with neuropathy still taking gabapentin as instructed has changed some of her nerve medicines she did ask about pain in the inferior heel and arch which is exquisitely painful tenderness symptomatically direct lateral compression tenderness and identified fascia from medial to mid medial calcaneal tubercle bilateral patient also asked about light therapy for neuropathy. They're going to the seminar suggested to ask questions and asked for research in digit and study information including these. Also suggested trying patient already has a TENS unit at home consider using TENS unit for her painful feet and hands and peripheral neuropathy.       Assessment & Plan:  Assessment this time history of peripheral neuropathy pain symptomology ankle sprain is improved however we'll try TENS unit for neuropathy continue with gabapentin fascial strapping applied to both feet reassess in 2-4 weeks for reevaluation does have orthotics made the benefit from new orthotic or orthotic shoe accommodations in the future. Recheck within 4 weeks for follow-up testing is stable shoe at all times no barefoot or flimsy shoes next  Harriet Masson DPM

## 2014-12-01 ENCOUNTER — Inpatient Hospital Stay (HOSPITAL_COMMUNITY)
Admission: EM | Admit: 2014-12-01 | Discharge: 2014-12-06 | DRG: 389 | Disposition: A | Discharge status: Home / Self Care | Payer: 59 | Attending: Internal Medicine | Admitting: Internal Medicine

## 2014-12-01 ENCOUNTER — Emergency Department (HOSPITAL_COMMUNITY): Payer: 59

## 2014-12-01 ENCOUNTER — Encounter (HOSPITAL_COMMUNITY): Payer: Self-pay | Admitting: *Deleted

## 2014-12-01 DIAGNOSIS — K219 Gastro-esophageal reflux disease without esophagitis: Secondary | ICD-10-CM | POA: Diagnosis present

## 2014-12-01 DIAGNOSIS — Z886 Allergy status to analgesic agent status: Secondary | ICD-10-CM | POA: Diagnosis not present

## 2014-12-01 DIAGNOSIS — E039 Hypothyroidism, unspecified: Secondary | ICD-10-CM | POA: Diagnosis present

## 2014-12-01 DIAGNOSIS — K567 Ileus, unspecified: Secondary | ICD-10-CM | POA: Diagnosis present

## 2014-12-01 DIAGNOSIS — Z6841 Body Mass Index (BMI) 40.0 and over, adult: Secondary | ICD-10-CM

## 2014-12-01 DIAGNOSIS — J45909 Unspecified asthma, uncomplicated: Secondary | ICD-10-CM | POA: Diagnosis present

## 2014-12-01 DIAGNOSIS — E079 Disorder of thyroid, unspecified: Secondary | ICD-10-CM | POA: Diagnosis present

## 2014-12-01 DIAGNOSIS — R112 Nausea with vomiting, unspecified: Secondary | ICD-10-CM

## 2014-12-01 DIAGNOSIS — R109 Unspecified abdominal pain: Secondary | ICD-10-CM | POA: Diagnosis present

## 2014-12-01 DIAGNOSIS — J449 Chronic obstructive pulmonary disease, unspecified: Secondary | ICD-10-CM | POA: Diagnosis present

## 2014-12-01 DIAGNOSIS — F1320 Sedative, hypnotic or anxiolytic dependence, uncomplicated: Secondary | ICD-10-CM | POA: Diagnosis present

## 2014-12-01 DIAGNOSIS — F17210 Nicotine dependence, cigarettes, uncomplicated: Secondary | ICD-10-CM | POA: Diagnosis present

## 2014-12-01 DIAGNOSIS — M797 Fibromyalgia: Secondary | ICD-10-CM | POA: Diagnosis present

## 2014-12-01 DIAGNOSIS — E785 Hyperlipidemia, unspecified: Secondary | ICD-10-CM | POA: Diagnosis present

## 2014-12-01 DIAGNOSIS — R0602 Shortness of breath: Secondary | ICD-10-CM

## 2014-12-01 DIAGNOSIS — G43909 Migraine, unspecified, not intractable, without status migrainosus: Secondary | ICD-10-CM | POA: Diagnosis present

## 2014-12-01 DIAGNOSIS — F418 Other specified anxiety disorders: Secondary | ICD-10-CM | POA: Diagnosis present

## 2014-12-01 DIAGNOSIS — F1120 Opioid dependence, uncomplicated: Secondary | ICD-10-CM | POA: Diagnosis present

## 2014-12-01 DIAGNOSIS — K5900 Constipation, unspecified: Secondary | ICD-10-CM | POA: Diagnosis present

## 2014-12-01 DIAGNOSIS — F410 Panic disorder [episodic paroxysmal anxiety] without agoraphobia: Secondary | ICD-10-CM | POA: Diagnosis present

## 2014-12-01 DIAGNOSIS — K56609 Unspecified intestinal obstruction, unspecified as to partial versus complete obstruction: Secondary | ICD-10-CM | POA: Diagnosis present

## 2014-12-01 DIAGNOSIS — J069 Acute upper respiratory infection, unspecified: Secondary | ICD-10-CM | POA: Diagnosis present

## 2014-12-01 DIAGNOSIS — G894 Chronic pain syndrome: Secondary | ICD-10-CM | POA: Diagnosis present

## 2014-12-01 DIAGNOSIS — K565 Intestinal adhesions [bands] with obstruction (postprocedural) (postinfection): Secondary | ICD-10-CM | POA: Diagnosis present

## 2014-12-01 DIAGNOSIS — Z8709 Personal history of other diseases of the respiratory system: Secondary | ICD-10-CM

## 2014-12-01 DIAGNOSIS — R339 Retention of urine, unspecified: Secondary | ICD-10-CM | POA: Diagnosis present

## 2014-12-01 DIAGNOSIS — K760 Fatty (change of) liver, not elsewhere classified: Secondary | ICD-10-CM | POA: Diagnosis present

## 2014-12-01 DIAGNOSIS — E119 Type 2 diabetes mellitus without complications: Secondary | ICD-10-CM | POA: Diagnosis present

## 2014-12-01 DIAGNOSIS — Z881 Allergy status to other antibiotic agents status: Secondary | ICD-10-CM

## 2014-12-01 DIAGNOSIS — K5669 Other intestinal obstruction: Secondary | ICD-10-CM | POA: Diagnosis not present

## 2014-12-01 HISTORY — DX: Unspecified intestinal obstruction, unspecified as to partial versus complete obstruction: K56.609

## 2014-12-01 HISTORY — DX: Opioid dependence, uncomplicated: F11.20

## 2014-12-01 HISTORY — DX: Fatty (change of) liver, not elsewhere classified: K76.0

## 2014-12-01 LAB — COMPREHENSIVE METABOLIC PANEL
ALT: 27 U/L (ref 0–35)
AST: 38 U/L — ABNORMAL HIGH (ref 0–37)
Albumin: 4.5 g/dL (ref 3.5–5.2)
Alkaline Phosphatase: 52 U/L (ref 39–117)
Anion gap: 15 (ref 5–15)
BILIRUBIN TOTAL: 0.6 mg/dL (ref 0.3–1.2)
BUN: 16 mg/dL (ref 6–23)
CHLORIDE: 97 meq/L (ref 96–112)
CO2: 27 mmol/L (ref 19–32)
Calcium: 10.2 mg/dL (ref 8.4–10.5)
Creatinine, Ser: 0.92 mg/dL (ref 0.50–1.10)
GFR, EST AFRICAN AMERICAN: 79 mL/min — AB (ref >90)
GFR, EST NON AFRICAN AMERICAN: 68 mL/min — AB (ref >90)
GLUCOSE: 143 mg/dL — AB (ref 70–99)
Potassium: 4 mmol/L (ref 3.5–5.1)
Sodium: 139 mmol/L (ref 135–145)
Total Protein: 9 g/dL — ABNORMAL HIGH (ref 6.0–8.3)

## 2014-12-01 LAB — CBC
HCT: 50.7 % — ABNORMAL HIGH (ref 36.0–46.0)
Hemoglobin: 15.9 g/dL — ABNORMAL HIGH (ref 12.0–15.0)
MCH: 30.2 pg (ref 26.0–34.0)
MCHC: 31.4 g/dL (ref 30.0–36.0)
MCV: 96.4 fL (ref 78.0–100.0)
PLATELETS: 293 10*3/uL (ref 150–400)
RBC: 5.26 MIL/uL — ABNORMAL HIGH (ref 3.87–5.11)
RDW: 14.7 % (ref 11.5–15.5)
WBC: 16.8 10*3/uL — ABNORMAL HIGH (ref 4.0–10.5)

## 2014-12-01 LAB — LIPASE, BLOOD: Lipase: 58 U/L (ref 11–59)

## 2014-12-01 MED ORDER — ONDANSETRON HCL 4 MG/2ML IJ SOLN
4.0000 mg | Freq: Once | INTRAMUSCULAR | Status: AC
  Administered 2014-12-01: 4 mg via INTRAVENOUS
  Filled 2014-12-01: qty 2

## 2014-12-01 MED ORDER — LIDOCAINE HCL 2 % EX GEL
CUTANEOUS | Status: AC
  Filled 2014-12-01: qty 10

## 2014-12-01 MED ORDER — HYDROMORPHONE HCL 1 MG/ML IJ SOLN
1.0000 mg | Freq: Once | INTRAMUSCULAR | Status: AC
  Administered 2014-12-01: 1 mg via INTRAVENOUS
  Filled 2014-12-01: qty 1

## 2014-12-01 MED ORDER — ONDANSETRON HCL 4 MG/2ML IJ SOLN: 4.0000 mg | Freq: Three times a day (TID) | INTRAMUSCULAR | Status: DC | PRN

## 2014-12-01 MED ORDER — IOHEXOL 300 MG/ML  SOLN
100.0000 mL | Freq: Once | INTRAMUSCULAR | Status: AC | PRN
  Administered 2014-12-01: 100 mL via INTRAVENOUS

## 2014-12-01 MED ORDER — MORPHINE SULFATE 4 MG/ML IJ SOLN
4.0000 mg | Freq: Once | INTRAMUSCULAR | Status: AC
  Administered 2014-12-01: 4 mg via INTRAVENOUS
  Filled 2014-12-01: qty 1

## 2014-12-01 MED ORDER — SODIUM CHLORIDE 0.9 % IV BOLUS (SEPSIS)
1000.0000 mL | Freq: Once | INTRAVENOUS | Status: AC
  Administered 2014-12-01: 1000 mL via INTRAVENOUS

## 2014-12-01 MED ORDER — HYDROMORPHONE HCL 1 MG/ML IJ SOLN: 1.0000 mg | INTRAMUSCULAR | Status: DC | PRN

## 2014-12-01 MED ORDER — IOHEXOL 300 MG/ML  SOLN
50.0000 mL | Freq: Once | INTRAMUSCULAR | Status: AC | PRN
  Administered 2014-12-01: 50 mL via ORAL

## 2014-12-01 MED ORDER — ONDANSETRON HCL 4 MG/2ML IJ SOLN
4.0000 mg | Freq: Once | INTRAMUSCULAR | Status: DC
  Filled 2014-12-01: qty 2

## 2014-12-01 MED ORDER — SODIUM CHLORIDE 0.9 % IV SOLN: INTRAVENOUS | Status: DC

## 2014-12-01 NOTE — ED Notes (Signed)
Pt unable to void at this time. 

## 2014-12-01 NOTE — ED Notes (Signed)
I walked patient to bathroom and she tried to urinate but couldn't.

## 2014-12-01 NOTE — ED Notes (Signed)
Bed: WA08 Expected date:  Expected time:  Means of arrival:  Comments: N/V

## 2014-12-01 NOTE — ED Notes (Signed)
Per EMS, pt has had nausea, vomiting, intermittent RLQ abdominal pain since this morning. Pt has hx of gallbladder removal, still has appendix. Pt describes the pain as "cramping". Pt states she is unable to have a BM, states she had a BM yesterday. Pt A&Ox4. Pt states pain is 10/10.

## 2014-12-01 NOTE — ED Notes (Signed)
Upon entering room, Pt's O2 saturation was between 83-87% on Room Air. Pt was placed on O2 and 2LPM via a nasal cannula. O2 saturation is now in the 90%'s.   Pt and family deny any further needs at this time.

## 2014-12-01 NOTE — ED Notes (Signed)
Pt states she is still in extreme pain, rated 10/10

## 2014-12-01 NOTE — ED Provider Notes (Addendum)
CSN: [id number]     Arrival date & time 12/01/14  1733 History   First MD Initiated Contact with Patient 12/01/14 1751     Chief Complaint  Patient presents with  . Abdominal Pain     (Consider location/radiation/quality/duration/timing/severity/associated sxs/prior Treatment) Patient is a 58 y.o. female presenting with abdominal pain. The history is provided by the patient.  Abdominal Pain Associated symptoms: nausea and vomiting   Associated symptoms: no chest pain, no chills, no constipation, no diarrhea, no dysuria, no fever, no shortness of breath and no sore throat   pt c/o abdominal pain onset in past day, constant, dull, moderate-severe. No specific exacerbating or alleviating factors. Couple episodes emesis, not bloody or bilious. No diarrhea. Had normal bm yesterday. No abd distension. Prior abd surgery includes cholecystectomy and hysterectomy. No hx pud or pancreatitis. States pain localized from umbilicus to epigastric region.  No cp or sob. No cough or uri c/o. Denies hx same pain. No fever or chills.       Past Medical History  Diagnosis Date  . COPD (chronic obstructive pulmonary disease)   . Fibromyalgia   . Thyroid disease   . Morbid obesity   . GERD (gastroesophageal reflux disease)   . Hyperlipidemia   . Asthma   . Diverticulitis   . Lumbar sprain   . Diabetes mellitus without complication   . Pneumonia 2015  . Hypothyroidism   . Migraine   . Depression   . Anxiety   . History of kidney stones   . Constipation due to pain medication   . Requires supplemental oxygen     PRN uses 1.5 Liters  . Neuropathy     feet and legs  . Tingling sensation     hands  . Dermatitis   . History of panic attacks    Past Surgical History  Procedure Laterality Date  . Bladder tack      X 2  . Abdominal hysterectomy    . Cholecystectomy N/A 05/25/2014    Procedure: LAPAROSCOPIC CHOLECYSTECTOMY WITH INTRAOPERATIVE CHOLANGIOGRAM;  Surgeon: Odis Hollingshead, MD;   Location: [facility];  Service: General;  Laterality: N/A;   No family history on file. History  Substance Use Topics  . Smoking status: Current Every Day Smoker -- 1.00 packs/day for 37 years    Types: Cigarettes  . Smokeless tobacco: Never Used  . Alcohol Use: No   OB History    No data available     Review of Systems  Constitutional: Negative for fever and chills.  HENT: Negative for sore throat.   Eyes: Negative for redness.  Respiratory: Negative for shortness of breath.   Cardiovascular: Negative for chest pain.  Gastrointestinal: Positive for nausea, vomiting and abdominal pain. Negative for diarrhea and constipation.  Endocrine: Negative for polyuria.  Genitourinary: Negative for dysuria and flank pain.  Musculoskeletal: Negative for back pain and neck pain.  Skin: Negative for rash.  Neurological: Negative for headaches.  Hematological: Does not bruise/bleed easily.  Psychiatric/Behavioral: Negative for confusion.      Allergies  Montelukast sodium; Doxycycline; and Hydrocodone-acetaminophen  Home Medications   Prior to Admission medications   Medication Sig Start Date End Date Taking? Authorizing Provider  ALPRAZolam Duanne Moron) 1 MG tablet Take 1 mg by mouth 3 (three) times daily as needed for anxiety.     Historical Provider, MD  amitriptyline (ELAVIL) 25 MG tablet Take 25 mg by mouth at bedtime.    Historical Provider, MD  CALCIUM-VITAMIN D PO Take  1 tablet by mouth daily.    Historical Provider, MD  Cinnamon 500 MG capsule Take 500 mg by mouth 2 (two) times daily.    Historical Provider, MD  cyanocobalamin 1000 MCG tablet Take 1,000 mcg by mouth daily.     Historical Provider, MD  cyclobenzaprine (FLEXERIL) 10 MG tablet Take 10 mg by mouth 3 (three) times daily as needed for muscle spasms.    Historical Provider, MD  dexlansoprazole (DEXILANT) 60 MG capsule Take 60 mg by mouth daily.    Historical Provider, MD  ergocalciferol (VITAMIN D2) 50000 UNITS capsule Take  50,000 Units by mouth once a week. Friday    Historical Provider, MD  fish oil-omega-3 fatty acids 1000 MG capsule Take 1 g by mouth 3 (three) times daily with meals.     Historical Provider, MD  flurazepam (DALMANE) 30 MG capsule Take 30 mg by mouth at bedtime.     Historical Provider, MD  gabapentin (NEURONTIN) 800 MG tablet Take 400-800 mg by mouth 3 (three) times daily. 0.5 tablet (400 mg) in the morning and then 0.5 tablet (400 mg) 8 hours later and 1 tablet (800mg ) at bedtime.    Historical Provider, MD  Garlic 7124 MG CAPS Take 1,000 mg by mouth daily.     Historical Provider, MD  levothyroxine (SYNTHROID) 200 MCG tablet Take 200 mcg by mouth daily before breakfast.     Historical Provider, MD  Menthol, Topical Analgesic, (MINERAL ICE) 2 % GEL Apply 1 application topically as needed (back and feet pain).    Historical Provider, MD  mineral oil liquid Take 15-30 mLs by mouth at bedtime. Constipation    Historical Provider, MD  ondansetron (ZOFRAN) 4 MG tablet Take 1 tablet (4 mg total) by mouth every 4 (four) hours as needed for nausea or vomiting. 05/04/14   Jackolyn Confer, MD  OVER THE COUNTER MEDICATION Take 1 tablet by mouth 2 (two) times daily with a meal. Garcinia cambogia (weight loss supplement)    Historical Provider, MD  Red Yeast Rice Extract (RED YEAST RICE PO) Take 1,000 mg by mouth daily before breakfast.    Historical Provider, MD  Tapentadol HCl (NUCYNTA ER) 100 MG TB12 Take 100 mg by mouth 2 (two) times daily before a meal.     Historical Provider, MD  theophylline (THEO-24) 300 MG 24 hr capsule Take 300 mg by mouth 2 (two) times daily.     Historical Provider, MD  traMADol (ULTRAM) 50 MG tablet Take 1-2 tablets (50-100 mg total) by mouth every 6 (six) hours as needed. 05/26/14   Jackolyn Confer, MD  vitamin C (ASCORBIC ACID) 500 MG tablet Take 500 mg by mouth daily.    Historical Provider, MD  Zinc 50 MG TABS Take 50 mg by mouth daily.    Historical Provider, MD   BP 152/52  mmHg  Pulse 99  Temp(Src) 98 F (36.7 C) (Oral)  Resp 18  SpO2 92% Physical Exam  Constitutional: She appears well-developed and well-nourished. No distress.  HENT:  Mouth/Throat: Oropharynx is clear and moist.  Eyes: Conjunctivae are normal. No scleral icterus.  Neck: Neck supple. No tracheal deviation present.  Cardiovascular: Normal rate, regular rhythm, normal heart sounds and intact distal pulses.   Pulmonary/Chest: Effort normal and breath sounds normal. No respiratory distress.  Abdominal: Soft. Normal appearance and bowel sounds are normal. She exhibits no distension and no mass. There is tenderness. There is no rebound and no guarding.  Morbidly obese. Mid abd and epigastric tenderness.  No rebound or guarding. No incarc hernia.   Genitourinary:  No cva tenderness  Musculoskeletal: She exhibits no edema or tenderness.  Neurological: She is alert.  Skin: Skin is warm and dry. No rash noted. She is not diaphoretic.  Psychiatric: She has a normal mood and affect.  Nursing note and vitals reviewed.   ED Course  Procedures (including critical care time) Labs Review  Results for orders placed or performed during the hospital encounter of 12/01/14  Comprehensive metabolic panel  Result Value Ref Range   Sodium 139 135 - 145 mmol/L   Potassium 4.0 3.5 - 5.1 mmol/L   Chloride 97 96 - 112 mEq/L   CO2 27 19 - 32 mmol/L   Glucose, Bld 143 (H) 70 - 99 mg/dL   BUN 16 6 - 23 mg/dL   Creatinine, Ser 0.92 0.50 - 1.10 mg/dL   Calcium 10.2 8.4 - 10.5 mg/dL   Total Protein 9.0 (H) 6.0 - 8.3 g/dL   Albumin 4.5 3.5 - 5.2 g/dL   AST 38 (H) 0 - 37 U/L   ALT 27 0 - 35 U/L   Alkaline Phosphatase 52 39 - 117 U/L   Total Bilirubin 0.6 0.3 - 1.2 mg/dL   GFR calc non Af Amer 68 (L) >90 mL/min   GFR calc Af Amer 79 (L) >90 mL/min   Anion gap 15 5 - 15  CBC  Result Value Ref Range   WBC 16.8 (H) 4.0 - 10.5 K/uL   RBC 5.26 (H) 3.87 - 5.11 MIL/uL   Hemoglobin 15.9 (H) 12.0 - 15.0 g/dL    HCT 50.7 (H) 36.0 - 46.0 %   MCV 96.4 78.0 - 100.0 fL   MCH 30.2 26.0 - 34.0 pg   MCHC 31.4 30.0 - 36.0 g/dL   RDW 14.7 11.5 - 15.5 %   Platelets 293 150 - 400 K/uL  Lipase, blood  Result Value Ref Range   Lipase 58 11 - 59 U/L   Ct Abdomen Pelvis W Contrast  12/01/2014   CLINICAL DATA:  Right lower quadrant pain common nausea and vomiting since this morning. Constipation. History of cholecystectomy.  EXAM: CT ABDOMEN AND PELVIS WITH CONTRAST  TECHNIQUE: Multidetector CT imaging of the abdomen and pelvis was performed using the standard protocol following bolus administration of intravenous contrast.  CONTRAST:  73mL OMNIPAQUE IOHEXOL 300 MG/ML SOLN, 15mL OMNIPAQUE IOHEXOL 300 MG/ML SOLN  COMPARISON:  12/09/2009  FINDINGS: Atelectasis in the lung bases.  Diffuse fatty infiltration of the liver. Surgical absence of the gallbladder. No bile duct dilatation. Calcified granulomas in the spleen. The pancreas, adrenal glands, kidneys, abdominal aorta, inferior vena cava, and retroperitoneal lymph nodes are unremarkable. Mild diffuse dilatation of small bowel with predominantly fluid-filled loops. Transition zone is demonstrated in the right lower quadrant with significant decompression of distal small bowel. This is consistent with mechanical small bowel obstruction. Cause of obstruction is not identified. Statistically, adhesions should be considered. Colon is stool filled and mostly decompressed. No free air or free fluid in the abdomen. Minimal mesenteric edema. Periumbilical hernia containing fat. No bowel herniation. No bowel wall thickening.  Pelvis: The appendix is normal. Surgical absence of the uterus. Bladder wall is not thickened. No pelvic mass or lymphadenopathy. No free or loculated pelvic fluid collections. Degenerative changes in the lumbar spine.  IMPRESSION: Dilated fluid-filled small bowel with transition zone in the right lower quadrant consistent with mechanical obstruction. Cause is not  demonstrated. Mild mesenteric edema. Midline abdominal wall hernia  containing fat. No bowel herniation. Diffuse fatty infiltration of the liver. Surgical absence of gallbladder. Calcified granulomas in the spleen.   Electronically Signed   By: Lucienne Capers M.D.   On: 12/01/2014 22:03       MDM   Iv ns. Labs. Morphine for pain.  Reviewed nursing notes and prior charts for additional history.   Dilaudid 1 mg iv for pain. zofran for nausea.  Pain and nausea improved w meds. Ct.  Recheck comfortable, ct pending.   Ct noted. gen surg consult. Anticipate med service admit and gen surg consult.  Ng to liws. Additional ivf.   Discussed w Dr Harlow Asa, gen surg, incl prior hx and todays labs, wbc, and ct - he indicates for now, ivf, ng liws, pain and nausea control, they will consult/follow, given mult other med issues, and feeling pt likely will not require surgical intervention, he requests med service admit.   hospitalist paged.         Mirna Mires, MD 12/01/14 [phone]

## 2014-12-02 ENCOUNTER — Encounter (HOSPITAL_COMMUNITY): Payer: Self-pay | Admitting: Internal Medicine

## 2014-12-02 ENCOUNTER — Inpatient Hospital Stay (HOSPITAL_COMMUNITY): Payer: 59

## 2014-12-02 DIAGNOSIS — Z8709 Personal history of other diseases of the respiratory system: Secondary | ICD-10-CM

## 2014-12-02 DIAGNOSIS — K5669 Other intestinal obstruction: Secondary | ICD-10-CM

## 2014-12-02 DIAGNOSIS — E039 Hypothyroidism, unspecified: Secondary | ICD-10-CM | POA: Diagnosis present

## 2014-12-02 DIAGNOSIS — G894 Chronic pain syndrome: Secondary | ICD-10-CM

## 2014-12-02 DIAGNOSIS — K760 Fatty (change of) liver, not elsewhere classified: Secondary | ICD-10-CM

## 2014-12-02 HISTORY — DX: Fatty (change of) liver, not elsewhere classified: K76.0

## 2014-12-02 LAB — CBC WITH DIFFERENTIAL/PLATELET
BASOS ABS: 0 10*3/uL (ref 0.0–0.1)
Basophils Relative: 0 % (ref 0–1)
EOS ABS: 0 10*3/uL (ref 0.0–0.7)
Eosinophils Relative: 0 % (ref 0–5)
HCT: 50.5 % — ABNORMAL HIGH (ref 36.0–46.0)
HEMOGLOBIN: 15.5 g/dL — AB (ref 12.0–15.0)
LYMPHS ABS: 1.4 10*3/uL (ref 0.7–4.0)
Lymphocytes Relative: 8 % — ABNORMAL LOW (ref 12–46)
MCH: 30.3 pg (ref 26.0–34.0)
MCHC: 30.7 g/dL (ref 30.0–36.0)
MCV: 98.8 fL (ref 78.0–100.0)
Monocytes Absolute: 1.5 10*3/uL — ABNORMAL HIGH (ref 0.1–1.0)
Monocytes Relative: 9 % (ref 3–12)
NEUTROS ABS: 13.8 10*3/uL — AB (ref 1.7–7.7)
Neutrophils Relative %: 83 % — ABNORMAL HIGH (ref 43–77)
Platelets: 316 10*3/uL (ref 150–400)
RBC: 5.11 MIL/uL (ref 3.87–5.11)
RDW: 15 % (ref 11.5–15.5)
WBC: 16.7 10*3/uL — ABNORMAL HIGH (ref 4.0–10.5)

## 2014-12-02 LAB — GLUCOSE, CAPILLARY
GLUCOSE-CAPILLARY: 161 mg/dL — AB (ref 70–99)
GLUCOSE-CAPILLARY: 117 mg/dL — AB (ref 70–99)
GLUCOSE-CAPILLARY: 118 mg/dL — AB (ref 70–99)
Glucose-Capillary: 141 mg/dL — ABNORMAL HIGH (ref 70–99)
Glucose-Capillary: 151 mg/dL — ABNORMAL HIGH (ref 70–99)

## 2014-12-02 LAB — COMPREHENSIVE METABOLIC PANEL
ALT: 32 U/L (ref 0–35)
ANION GAP: 13 (ref 5–15)
AST: 39 U/L — ABNORMAL HIGH (ref 0–37)
Albumin: 3.9 g/dL (ref 3.5–5.2)
Alkaline Phosphatase: 51 U/L (ref 39–117)
BUN: 24 mg/dL — AB (ref 6–23)
CHLORIDE: 94 meq/L — AB (ref 96–112)
CO2: 33 mmol/L — ABNORMAL HIGH (ref 19–32)
Calcium: 9.1 mg/dL (ref 8.4–10.5)
Creatinine, Ser: 1.07 mg/dL (ref 0.50–1.10)
GFR calc Af Amer: 66 mL/min — ABNORMAL LOW (ref >90)
GFR calc non Af Amer: 57 mL/min — ABNORMAL LOW (ref >90)
GLUCOSE: 159 mg/dL — AB (ref 70–99)
POTASSIUM: 4.3 mmol/L (ref 3.5–5.1)
SODIUM: 140 mmol/L (ref 135–145)
Total Bilirubin: 0.5 mg/dL (ref 0.3–1.2)
Total Protein: 8.1 g/dL (ref 6.0–8.3)

## 2014-12-02 MED ORDER — LEVALBUTEROL HCL 0.63 MG/3ML IN NEBU
0.6300 mg | INHALATION_SOLUTION | Freq: Four times a day (QID) | RESPIRATORY_TRACT | Status: DC | PRN
  Administered 2014-12-02: 0.63 mg via RESPIRATORY_TRACT
  Filled 2014-12-02: qty 3

## 2014-12-02 MED ORDER — ENOXAPARIN SODIUM 40 MG/0.4ML ~~LOC~~ SOLN
40.0000 mg | SUBCUTANEOUS | Status: DC
  Filled 2014-12-02: qty 0.4

## 2014-12-02 MED ORDER — ACETAMINOPHEN 325 MG PO TABS: 650.0000 mg | ORAL_TABLET | Freq: Four times a day (QID) | ORAL | Status: DC | PRN

## 2014-12-02 MED ORDER — ENOXAPARIN SODIUM 80 MG/0.8ML ~~LOC~~ SOLN
75.0000 mg | SUBCUTANEOUS | Status: DC
  Administered 2014-12-02 – 2014-12-06 (×5): 75 mg via SUBCUTANEOUS
  Filled 2014-12-02 (×5): qty 0.8

## 2014-12-02 MED ORDER — CHLORHEXIDINE GLUCONATE 0.12 % MT SOLN
15.0000 mL | Freq: Two times a day (BID) | OROMUCOSAL | Status: DC
  Administered 2014-12-03 – 2014-12-06 (×7): 15 mL via OROMUCOSAL
  Filled 2014-12-02 (×11): qty 15

## 2014-12-02 MED ORDER — CETYLPYRIDINIUM CHLORIDE 0.05 % MT LIQD
7.0000 mL | Freq: Two times a day (BID) | OROMUCOSAL | Status: DC
  Administered 2014-12-03 – 2014-12-05 (×3): 7 mL via OROMUCOSAL

## 2014-12-02 MED ORDER — LORAZEPAM 2 MG/ML IJ SOLN
0.5000 mg | Freq: Four times a day (QID) | INTRAMUSCULAR | Status: DC | PRN
  Administered 2014-12-02 – 2014-12-06 (×5): 0.5 mg via INTRAVENOUS
  Filled 2014-12-02 (×5): qty 1

## 2014-12-02 MED ORDER — ONDANSETRON HCL 4 MG PO TABS: 4.0000 mg | ORAL_TABLET | Freq: Four times a day (QID) | ORAL | Status: DC | PRN

## 2014-12-02 MED ORDER — HYDROMORPHONE HCL 1 MG/ML IJ SOLN
1.0000 mg | INTRAMUSCULAR | Status: DC | PRN
Start: 2014-12-02 — End: 2014-12-06
  Administered 2014-12-02 – 2014-12-03 (×6): 1 mg via INTRAVENOUS
  Filled 2014-12-02 (×6): qty 1

## 2014-12-02 MED ORDER — LEVOTHYROXINE SODIUM 100 MCG IV SOLR
100.0000 ug | Freq: Every day | INTRAVENOUS | Status: DC
  Administered 2014-12-02 – 2014-12-06 (×5): 100 ug via INTRAVENOUS
  Filled 2014-12-02 (×9): qty 5

## 2014-12-02 MED ORDER — FENTANYL 25 MCG/HR TD PT72
25.0000 ug | MEDICATED_PATCH | TRANSDERMAL | Status: DC
  Administered 2014-12-02 – 2014-12-05 (×2): 25 ug via TRANSDERMAL
  Filled 2014-12-02 (×2): qty 1

## 2014-12-02 MED ORDER — ACETAMINOPHEN 650 MG RE SUPP
650.0000 mg | Freq: Four times a day (QID) | RECTAL | Status: DC | PRN
  Filled 2014-12-02: qty 1

## 2014-12-02 MED ORDER — ONDANSETRON HCL 4 MG/2ML IJ SOLN
4.0000 mg | Freq: Four times a day (QID) | INTRAMUSCULAR | Status: DC | PRN
  Administered 2014-12-04 – 2014-12-05 (×2): 4 mg via INTRAVENOUS
  Filled 2014-12-02 (×2): qty 2

## 2014-12-02 MED ORDER — SODIUM CHLORIDE 0.9 % IV SOLN
INTRAVENOUS | Status: DC
  Administered 2014-12-02 – 2014-12-03 (×4): via INTRAVENOUS

## 2014-12-02 NOTE — Progress Notes (Signed)
[place] requested to adjust Lovenox dose as needed for VTE prophylaxis  Height = 66 inches Weight = 148.6 kg BMI = 52 CrCl > 100 ml/min  Assessment:  As BMI > 30, will adjust Lovenox dose to 0.5 mg/kg/q24h  PLAN:  Change Lovenox to 75mg  sq q24h for VTE prophylaxis  Leone Haven, PharmD

## 2014-12-02 NOTE — Progress Notes (Signed)
Pt became very short of breath, anxious with a cough. Pt was assisted to sit up straight with some relief. Vital signs obtained, pt was 92% on 4 L of O2. Increased to 6 L for O2 sat of 95%. Rapid Response RN called and order for chest xray placed. Pt stated was exposed to the flu by family member a few days ago. [place] notified and order received for PRN breathing tx and respiratory panel. Respiratory Therapist notified to come give pt a breathing tx. Pt in stable condition, this RN to continue to monitor.

## 2014-12-02 NOTE — Progress Notes (Signed)
Subjective/Objective Acute episode of dyspnea with drop in oxygen saturation. Increasing oxygen requirements from 4-6 L [place]. Improvement with repositioning and increase in [place] oxygen.  Scheduled Meds: . antiseptic oral rinse  7 mL Mouth Rinse q12n4p  . chlorhexidine  15 mL Mouth Rinse BID  . enoxaparin (LOVENOX) injection  75 mg Subcutaneous Q24H  . fentaNYL  25 mcg Transdermal Q72H  . levothyroxine  100 mcg Intravenous QAC breakfast   Continuous Infusions: . sodium chloride 100 mL/hr at 12/02/14 1746   PRN Meds:acetaminophen **OR** acetaminophen, HYDROmorphone (DILAUDID) injection, levalbuterol, LORazepam, ondansetron **OR** ondansetron (ZOFRAN) IV  Vital signs in last 24 hours: Temp:  [98 F (36.7 C)-99.1 F (37.3 C)] 99.1 F (37.3 C) (01/02 2000) Pulse Rate:  [91-107] 103 (01/02 2037) Resp:  [18-22] 22 (01/02 2037) BP: (110-138)/(64-108) 124/74 mmHg (01/02 2037) SpO2:  [86 %-95 %] 95 % (01/02 2037) Weight:  [148.553 kg (327 lb 8 oz)] 148.553 kg (327 lb 8 oz) (01/02 0015)  Intake/Output last 3 shifts: I/O last 3 completed shifts: In: -  Out: 3000 [Urine:900; Emesis/NG output:2100] Intake/Output this shift:  Physical Examination: General appearance - in mild to moderate distress. Chest -  Tachypnea, wheezing noted bilaterally throughout Heart - mildly tachycardic rate with regular rhythm   Problem Assessment/Plan  COPD  Possible exacerbation. Have ordered prn inhaler and chest xray for evaluation. Patient states she has had influenza exposure over Christmas. She had the seasonal influenza vaccination in October, 2015. Will evaluate RVP.

## 2014-12-02 NOTE — Progress Notes (Signed)
Progress Note   Denise Case NUU:[id number] DOB: 02/26/1956 DOA: 12/01/2014 PCP: Tamsen Roers, MD   Brief Narrative:   Denise Case is an 59 y.o. female PMH of hypothyroidism, fibromyalgia/chronic pain syndrome and asthma who was admitted 12/02/13 with a chief complaint of abdominal pain. A CT scan of abdomen/pelvis showed findings consistent with a small bowel obstruction.  Assessment/Plan:   Principal Problem:   SBO (small bowel obstruction)  Continue NG tube for gastric decompression, IV fluids, and bowel rest.  SBO thought to be secondary to adhesions from prior TAH/BSO.  Surgery following.  Encourage ambulation.  Repeat abdominal films in the a.m.  Active Problems:   Hypothyroidism  Continue IV Synthroid.    Chronic pain syndrome  Dilaudid ordered as needed for pain.    History of asthma   Stable.    DVT Prophylaxis  SCDs ordered.  Code Status: Full. Family Communication: No family at the bedside. Disposition Plan: Home when stable.   IV Access:    Peripheral IV   Procedures and diagnostic studies:   Ct Abdomen Pelvis W Contrast 12/01/2014: Dilated fluid-filled small bowel with transition zone in the right lower quadrant consistent with mechanical obstruction. Cause is not demonstrated. Mild mesenteric edema. Midline abdominal wall hernia containing fat. No bowel herniation. Diffuse fatty infiltration of the liver. Surgical absence of gallbladder. Calcified granulomas in the spleen.     Medical Consultants:    Dr. Harlow Asa, Surgery.  Anti-Infectives:    None.  Subjective:    Denise Case is very sleepy and sedated.  She briefly opens her eyes to stimulation, but quickly falls back to sleep.    Objective:    Filed Vitals:   12/01/14 2120 12/01/14 2122 12/02/14 0015 12/02/14 0442  BP: 121/64  122/76 119/71  Pulse: 106  107 91  Temp:   98 F (36.7 C) 98.4 F (36.9 C)  TempSrc:   Oral Oral  Resp: 18  20 18   Height:   5\' 6"   (1.676 m)   Weight:   148.553 kg (327 lb 8 oz)   SpO2: 87% 91% 91% 93%    Intake/Output Summary (Last 24 hours) at 12/02/14 0754 Last data filed at 12/02/14 0733  Gross per 24 hour  Intake   1200 ml  Output      0 ml  Net   1200 ml    Exam: Gen:  NAD Cardiovascular:  Mildly tachy, No M/R/G Respiratory:  Lungs diminished Gastrointestinal:  Abdomen soft, NT/ND, + BS, NG tube draining bilious material Extremities:  No C/E/C   Data Reviewed:    Labs: Basic Metabolic Panel:  Recent Labs Lab 12/01/14 1809 12/02/14 0558  NA 139 140  K 4.0 4.3  CL 97 94*  CO2 27 33*  GLUCOSE 143* 159*  BUN 16 24*  CREATININE 0.92 1.07  CALCIUM 10.2 9.1   GFR Estimated Creatinine Clearance: 88 mL/min (by C-G formula based on Cr of 1.07). Liver Function Tests:  Recent Labs Lab 12/01/14 1809 12/02/14 0558  AST 38* 39*  ALT 27 32  ALKPHOS 52 51  BILITOT 0.6 0.5  PROT 9.0* 8.1  ALBUMIN 4.5 3.9    Recent Labs Lab 12/01/14 1809  LIPASE 58   CBC:  Recent Labs Lab 12/01/14 1809 12/02/14 0558  WBC 16.8* 16.7*  NEUTROABS  --  13.8*  HGB 15.9* 15.5*  HCT 50.7* 50.5*  MCV 96.4 98.8  PLT 293 316   CBG:  Recent Labs Lab 12/02/14 0419  GLUCAP 161*   Microbiology No results found for this or any previous visit (from the past 240 hour(s)).   Medications:   . antiseptic oral rinse  7 mL Mouth Rinse q12n4p  . chlorhexidine  15 mL Mouth Rinse BID  . enoxaparin (LOVENOX) injection  75 mg Subcutaneous Q24H  . levothyroxine  100 mcg Intravenous QAC breakfast  . lidocaine       Continuous Infusions: . sodium chloride 100 mL/hr at 12/02/14 0738    Time spent: 25 minutes.   LOS: 1 day   RAMA,CHRISTINA  [facility] Pager [phone]. If unable to reach me by pager, please call my cell phone at [phone].  *Please refer to [email or web address], password [email or web address] to get updated schedule on who will round on this patient, as hospitalists switch teams weekly. If 7PM-7AM, please  contact night-coverage at [email or web address], password [email or web address] for any overnight needs.  12/02/2014, 7:54 AM

## 2014-12-02 NOTE — Consult Note (Signed)
General Surgery [facility]  Reason for Consult: small bowel obstruction  Referring Physician: Dr. Forestine Na Long ER; Dr. Hal Hope, [facility]  Denise Case is an 59 y.o. female.  HPI: patient is a 59 yo WF admitted with small bowel obstruction.  Patient presented to ER with abdominal pain, nausea, and emesis.  WBC elevated.  CTA consistent with distal SBO with transition point in pelvis.  Admitted to medical service with multiple medical problems.  General surgery asked to evaluate and managed SBO.  Previous abdominal surgery includes lap chole by Dr. Zella Richer, TAH&BSO.  Past Medical History  Diagnosis Date  . COPD (chronic obstructive pulmonary disease)   . Fibromyalgia   . Thyroid disease   . Morbid obesity   . GERD (gastroesophageal reflux disease)   . Hyperlipidemia   . Asthma   . Diverticulitis   . Lumbar sprain   . Diabetes mellitus without complication   . Pneumonia 2015  . Hypothyroidism   . Migraine   . Depression   . Anxiety   . History of kidney stones   . Constipation due to pain medication   . Requires supplemental oxygen     PRN uses 1.5 Liters  . Neuropathy     feet and legs  . Tingling sensation     hands  . Dermatitis   . History of panic attacks     Past Surgical History  Procedure Laterality Date  . Bladder tack      X 2  . Abdominal hysterectomy    . Cholecystectomy N/A 05/25/2014    Procedure: LAPAROSCOPIC CHOLECYSTECTOMY WITH INTRAOPERATIVE CHOLANGIOGRAM;  Surgeon: Odis Hollingshead, MD;  Location: [facility];  Service: General;  Laterality: N/A;    History reviewed. No pertinent family history.  Social History:  reports that she has been smoking Cigarettes.  She has a 37 pack-year smoking history. She has never used smokeless tobacco. She reports that she does not drink alcohol or use illicit drugs.  Allergies:  Allergies  Allergen Reactions  . Montelukast Sodium Anaphylaxis and Swelling  .  Doxycycline Other (See Comments)    Strips lining off of top of tongue, like thrush  . Hydrocodone-Acetaminophen Other (See Comments)    Migraines (high doses)    Medications: I have reviewed the patient's current medications.  Results for orders placed or performed during the hospital encounter of 12/01/14 (from the past 48 hour(s))  Comprehensive metabolic panel     Status: Abnormal   Collection Time: 12/01/14  6:09 PM  Result Value Ref Range   Sodium 139 135 - 145 mmol/L    Comment: Please note change in reference range.   Potassium 4.0 3.5 - 5.1 mmol/L    Comment: Please note change in reference range.   Chloride 97 96 - 112 mEq/L   CO2 27 19 - 32 mmol/L   Glucose, Bld 143 (H) 70 - 99 mg/dL   BUN 16 6 - 23 mg/dL   Creatinine, Ser 0.92 0.50 - 1.10 mg/dL   Calcium 10.2 8.4 - 10.5 mg/dL   Total Protein 9.0 (H) 6.0 - 8.3 g/dL   Albumin 4.5 3.5 - 5.2 g/dL   AST 38 (H) 0 - 37 U/L   ALT 27 0 - 35 U/L   Alkaline Phosphatase 52 39 - 117 U/L   Total Bilirubin 0.6 0.3 - 1.2 mg/dL   GFR calc non Af Amer 68 (L) >90 mL/min   GFR calc Af Amer 79 (L) >90  mL/min    Comment: (NOTE) The eGFR has been calculated using the CKD EPI equation. This calculation has not been validated in all clinical situations. eGFR's persistently <90 mL/min signify possible Chronic Kidney Disease.    Anion gap 15 5 - 15  CBC     Status: Abnormal   Collection Time: 12/01/14  6:09 PM  Result Value Ref Range   WBC 16.8 (H) 4.0 - 10.5 K/uL   RBC 5.26 (H) 3.87 - 5.11 MIL/uL   Hemoglobin 15.9 (H) 12.0 - 15.0 g/dL   HCT 81.1 (H) 88.6 - 77.3 %   MCV 96.4 78.0 - 100.0 fL   MCH 30.2 26.0 - 34.0 pg   MCHC 31.4 30.0 - 36.0 g/dL   RDW 73.6 68.1 - 59.4 %   Platelets 293 150 - 400 K/uL  Lipase, blood     Status: None   Collection Time: 12/01/14  6:09 PM  Result Value Ref Range   Lipase 58 11 - 59 U/L  Glucose, capillary     Status: Abnormal   Collection Time: 12/02/14  4:19 AM  Result Value Ref Range    Glucose-Capillary 161 (H) 70 - 99 mg/dL   Comment 1 Notify RN   Comprehensive metabolic panel     Status: Abnormal   Collection Time: 12/02/14  5:58 AM  Result Value Ref Range   Sodium 140 135 - 145 mmol/L    Comment: Please note change in reference range.   Potassium 4.3 3.5 - 5.1 mmol/L    Comment: Please note change in reference range.   Chloride 94 (L) 96 - 112 mEq/L   CO2 33 (H) 19 - 32 mmol/L   Glucose, Bld 159 (H) 70 - 99 mg/dL   BUN 24 (H) 6 - 23 mg/dL   Creatinine, Ser 7.07 0.50 - 1.10 mg/dL   Calcium 9.1 8.4 - 61.5 mg/dL   Total Protein 8.1 6.0 - 8.3 g/dL   Albumin 3.9 3.5 - 5.2 g/dL   AST 39 (H) 0 - 37 U/L   ALT 32 0 - 35 U/L   Alkaline Phosphatase 51 39 - 117 U/L   Total Bilirubin 0.5 0.3 - 1.2 mg/dL   GFR calc non Af Amer 57 (L) >90 mL/min   GFR calc Af Amer 66 (L) >90 mL/min    Comment: (NOTE) The eGFR has been calculated using the CKD EPI equation. This calculation has not been validated in all clinical situations. eGFR's persistently <90 mL/min signify possible Chronic Kidney Disease.    Anion gap 13 5 - 15  CBC with Differential     Status: Abnormal   Collection Time: 12/02/14  5:58 AM  Result Value Ref Range   WBC 16.7 (H) 4.0 - 10.5 K/uL   RBC 5.11 3.87 - 5.11 MIL/uL   Hemoglobin 15.5 (H) 12.0 - 15.0 g/dL   HCT 18.3 (H) 43.7 - 35.7 %   MCV 98.8 78.0 - 100.0 fL   MCH 30.3 26.0 - 34.0 pg   MCHC 30.7 30.0 - 36.0 g/dL   RDW 89.7 84.7 - 84.1 %   Platelets 316 150 - 400 K/uL   Neutrophils Relative % 83 (H) 43 - 77 %   Neutro Abs 13.8 (H) 1.7 - 7.7 K/uL   Lymphocytes Relative 8 (L) 12 - 46 %   Lymphs Abs 1.4 0.7 - 4.0 K/uL   Monocytes Relative 9 3 - 12 %   Monocytes Absolute 1.5 (H) 0.1 - 1.0 K/uL   Eosinophils Relative  0 0 - 5 %   Eosinophils Absolute 0.0 0.0 - 0.7 K/uL   Basophils Relative 0 0 - 1 %   Basophils Absolute 0.0 0.0 - 0.1 K/uL    Ct Abdomen Pelvis W Contrast  12/01/2014   CLINICAL DATA:  Right lower quadrant pain common nausea and  vomiting since this morning. Constipation. History of cholecystectomy.  EXAM: CT ABDOMEN AND PELVIS WITH CONTRAST  TECHNIQUE: Multidetector CT imaging of the abdomen and pelvis was performed using the standard protocol following bolus administration of intravenous contrast.  CONTRAST:  39mL OMNIPAQUE IOHEXOL 300 MG/ML SOLN, 1105mL OMNIPAQUE IOHEXOL 300 MG/ML SOLN  COMPARISON:  12/09/2009  FINDINGS: Atelectasis in the lung bases.  Diffuse fatty infiltration of the liver. Surgical absence of the gallbladder. No bile duct dilatation. Calcified granulomas in the spleen. The pancreas, adrenal glands, kidneys, abdominal aorta, inferior vena cava, and retroperitoneal lymph nodes are unremarkable. Mild diffuse dilatation of small bowel with predominantly fluid-filled loops. Transition zone is demonstrated in the right lower quadrant with significant decompression of distal small bowel. This is consistent with mechanical small bowel obstruction. Cause of obstruction is not identified. Statistically, adhesions should be considered. Colon is stool filled and mostly decompressed. No free air or free fluid in the abdomen. Minimal mesenteric edema. Periumbilical hernia containing fat. No bowel herniation. No bowel wall thickening.  Pelvis: The appendix is normal. Surgical absence of the uterus. Bladder wall is not thickened. No pelvic mass or lymphadenopathy. No free or loculated pelvic fluid collections. Degenerative changes in the lumbar spine.  IMPRESSION: Dilated fluid-filled small bowel with transition zone in the right lower quadrant consistent with mechanical obstruction. Cause is not demonstrated. Mild mesenteric edema. Midline abdominal wall hernia containing fat. No bowel herniation. Diffuse fatty infiltration of the liver. Surgical absence of gallbladder. Calcified granulomas in the spleen.   Electronically Signed   By: Lucienne Capers M.D.   On: 12/01/2014 22:03    Review of Systems  Constitutional: Negative for  fever, chills, weight loss, malaise/fatigue and diaphoresis.  Eyes: Negative.   Respiratory: Negative.   Cardiovascular: Negative.   Gastrointestinal: Positive for nausea, vomiting and abdominal pain. Negative for diarrhea, constipation, blood in stool and melena.  Genitourinary: Negative.   Musculoskeletal: Positive for myalgias.  Skin: Negative.   Neurological: Positive for headaches. Negative for weakness.  Endo/Heme/Allergies: Negative.   Psychiatric/Behavioral: Negative.    Blood pressure 119/71, pulse 91, temperature 98.4 F (36.9 C), temperature source Oral, resp. rate 18, height $RemoveBe'5\' 6"'baOMhAiCa$  (1.676 m), weight 327 lb 8 oz (148.553 kg), SpO2 93 %. Physical Exam  Constitutional: She is oriented to person, place, and time. No distress.  Morbidly obese  HENT:  Head: Normocephalic and atraumatic.  Right Ear: External ear normal.  Left Ear: External ear normal.  Eyes: Conjunctivae are normal. Pupils are equal, round, and reactive to light. No scleral icterus.  Neck: Normal range of motion. Neck supple. No tracheal deviation present. No thyromegaly present.  Cardiovascular: Normal rate, regular rhythm, normal heart sounds and intact distal pulses.   Respiratory: Effort normal. She has wheezes.  GI: Soft. She exhibits distension. She exhibits no mass. There is no tenderness. There is no rebound and no guarding.  Few bowel sounds present; well-healed surgical wounds without sign of herniation  Musculoskeletal: Normal range of motion. She exhibits no edema.  Neurological: She is alert and oriented to person, place, and time.  Skin: Skin is warm and dry. She is not diaphoretic.  Psychiatric: She has a normal  mood and affect. Her behavior is normal.    Assessment/Plan:  Small bowel obstruction  Agree with NG, IV hydration, NPO  Large volume in NG cannister overnight - patient states pain resolved, feels improved  SBO likely secondary to adhesions in pelvis from TAH&BSO  Encouraged  ambulation, OOB  Will follow with you.  Discussed possibility of surgical intervention with patient if obstruction not clinically resolving in 24-48 hours.  Will repeat AXR in AM 1/3.  Appreciate medical service management of this patient.  Earnstine Regal, MD, [facility] Office: [place] 12/02/2014, 7:22 AM

## 2014-12-02 NOTE — H&P (Signed)
[facility] History and Physical  Denise Case HYW:[id number] DOB: [birth year] DOA: 12/01/2014  Referring physician: ER physician. PCP: Tamsen Roers, MD   Chief Complaint: Abdominal pain.  HPI: Denise Case is a 59 y.o. female with history of hypothyroidism, chronic pain and fibromyalgia and asthma presents to the ER because of abdominal pain. Patient has been having abdominal pain since morning which was mostly in the periumbilical and epigastric area with nausea vomiting. Patient's last bowel movement was 48 hours ago. Patient has not moved any flatus. In the ER patient had a CT abdomen and pelvis which shows features consistent with small bowel obstruction. On-call surgeon Dr. Harlow Asa has been consulted. Patient will be admitted for further management. Patient has had previous history of hysterectomy and cholecystectomy. Denies any chest pain or shortness of breath.   Review of Systems: As presented in the history of presenting illness, rest negative.  Past Medical History  Diagnosis Date  . COPD (chronic obstructive pulmonary disease)   . Fibromyalgia   . Thyroid disease   . Morbid obesity   . GERD (gastroesophageal reflux disease)   . Hyperlipidemia   . Asthma   . Diverticulitis   . Lumbar sprain   . Diabetes mellitus without complication   . Pneumonia 2015  . Hypothyroidism   . Migraine   . Depression   . Anxiety   . History of kidney stones   . Constipation due to pain medication   . Requires supplemental oxygen     PRN uses 1.5 Liters  . Neuropathy     feet and legs  . Tingling sensation     hands  . Dermatitis   . History of panic attacks    Past Surgical History  Procedure Laterality Date  . Bladder tack      X 2  . Abdominal hysterectomy    . Cholecystectomy N/A 05/25/2014    Procedure: LAPAROSCOPIC CHOLECYSTECTOMY WITH INTRAOPERATIVE CHOLANGIOGRAM;  Surgeon: Odis Hollingshead, MD;  Location: [facility];  Service: General;  Laterality: N/A;   Social  History:  reports that she has been smoking Cigarettes.  She has a 37 pack-year smoking history. She has never used smokeless tobacco. She reports that she does not drink alcohol or use illicit drugs. Where does patient live home. Can patient participate in ADLs? Yes.  Allergies  Allergen Reactions  . Montelukast Sodium Anaphylaxis and Swelling  . Doxycycline Other (See Comments)    Strips lining off of top of tongue, like thrush  . Hydrocodone-Acetaminophen Other (See Comments)    Migraines (high doses)    Family History: History reviewed. No pertinent family history.    Prior to Admission medications   Medication Sig Start Date End Date Taking? Authorizing Provider  ALPRAZolam Duanne Moron) 1 MG tablet Take 1 mg by mouth 3 (three) times daily as needed for anxiety (anxiety).    Yes Historical Provider, MD  amitriptyline (ELAVIL) 25 MG tablet Take 25 mg by mouth at bedtime.   Yes Historical Provider, MD  CALCIUM-VITAMIN D PO Take 1 tablet by mouth daily.   Yes Historical Provider, MD  Cinnamon 500 MG capsule Take 500 mg by mouth 2 (two) times daily.   Yes Historical Provider, MD  cyanocobalamin 1000 MCG tablet Take 1,000 mcg by mouth daily.    Yes Historical Provider, MD  cyclobenzaprine (FLEXERIL) 10 MG tablet Take 10 mg by mouth 3 (three) times daily as needed for muscle spasms.   Yes Historical Provider, MD  dexlansoprazole (DEXILANT) 60 MG  capsule Take 60 mg by mouth daily.   Yes Historical Provider, MD  fish oil-omega-3 fatty acids 1000 MG capsule Take 1 g by mouth 3 (three) times daily with meals.    Yes Historical Provider, MD  FLUoxetine (PROZAC) 20 MG capsule Take 20 mg by mouth daily.   Yes Historical Provider, MD  flurazepam (DALMANE) 30 MG capsule Take 30 mg by mouth at bedtime.    Yes Historical Provider, MD  gabapentin (NEURONTIN) 800 MG tablet Take 400-800 mg by mouth 3 (three) times daily. 0.5 tablet (400 mg) in the morning and then 0.5 tablet (400 mg) 8 hours later and 1 tablet  (800mg ) at bedtime.   Yes Historical Provider, MD  levothyroxine (SYNTHROID) 200 MCG tablet Take 200 mcg by mouth daily before breakfast.    Yes Historical Provider, MD  Menthol, Topical Analgesic, (MINERAL ICE) 2 % GEL Apply 1 application topically as needed (back and feet pain).   Yes Historical Provider, MD  mineral oil liquid Take 15-30 mLs by mouth daily as needed for mild constipation (constipation). Constipation   Yes Historical Provider, MD  Red Yeast Rice Extract (RED YEAST RICE PO) Take 1,000 mg by mouth daily before breakfast.   Yes Historical Provider, MD  Tapentadol HCl (NUCYNTA ER) 100 MG TB12 Take 100 mg by mouth 2 (two) times daily before a meal.    Yes Historical Provider, MD  theophylline (THEO-24) 300 MG 24 hr capsule Take 300 mg by mouth 2 (two) times daily.    Yes Historical Provider, MD  vitamin C (ASCORBIC ACID) 500 MG tablet Take 500 mg by mouth at bedtime.    Yes Historical Provider, MD  ergocalciferol (VITAMIN D2) 50000 UNITS capsule Take 50,000 Units by mouth once a week. Friday    Historical Provider, MD  ondansetron (ZOFRAN) 4 MG tablet Take 1 tablet (4 mg total) by mouth every 4 (four) hours as needed for nausea or vomiting. 05/04/14   Jackolyn Confer, MD  traMADol (ULTRAM) 50 MG tablet Take 1-2 tablets (50-100 mg total) by mouth every 6 (six) hours as needed. 05/26/14   Jackolyn Confer, MD    Physical Exam: Filed Vitals:   12/01/14 1906 12/01/14 2120 12/01/14 2122 12/02/14 0015  BP: 149/70 121/64  122/76  Pulse: 89 106  107  Temp:    98 F (36.7 C)  TempSrc:    Oral  Resp: 19 18  20   Height:    5\' 6"  (1.676 m)  SpO2: 94% 87% 91% 91%     General:  Obese not in distress.  Eyes: Anicteric no pallor.  ENT: No discharge from the ears eyes nose and mouth.  Neck: No mass felt.  Cardiovascular: S1-S2 heard.  Respiratory: No rhonchi or crepitations.  Abdomen: Distended no bowel sounds appreciated. No guarding or rigidity.  Skin: No rash.  Musculoskeletal:  No edema.  Psychiatric: Appears normal.  Neurologic: Alert awake oriented to time place and person. Moves all extremities.  Labs on Admission:  Basic Metabolic Panel:  Recent Labs Lab 12/01/14 1809  NA 139  K 4.0  CL 97  CO2 27  GLUCOSE 143*  BUN 16  CREATININE 0.92  CALCIUM 10.2   Liver Function Tests:  Recent Labs Lab 12/01/14 1809  AST 38*  ALT 27  ALKPHOS 52  BILITOT 0.6  PROT 9.0*  ALBUMIN 4.5    Recent Labs Lab 12/01/14 1809  LIPASE 58   No results for input(s): AMMONIA in the last 168 hours. CBC:  Recent  Labs Lab 12/01/14 1809  WBC 16.8*  HGB 15.9*  HCT 50.7*  MCV 96.4  PLT 293   Cardiac Enzymes: No results for input(s): CKTOTAL, CKMB, CKMBINDEX, TROPONINI in the last 168 hours.  BNP (last 3 results) No results for input(s): PROBNP in the last 8760 hours. CBG: No results for input(s): GLUCAP in the last 168 hours.  Radiological Exams on Admission: Ct Abdomen Pelvis W Contrast  12/01/2014   CLINICAL DATA:  Right lower quadrant pain common nausea and vomiting since this morning. Constipation. History of cholecystectomy.  EXAM: CT ABDOMEN AND PELVIS WITH CONTRAST  TECHNIQUE: Multidetector CT imaging of the abdomen and pelvis was performed using the standard protocol following bolus administration of intravenous contrast.  CONTRAST:  50mL OMNIPAQUE IOHEXOL 300 MG/ML SOLN, 170mL OMNIPAQUE IOHEXOL 300 MG/ML SOLN  COMPARISON:  12/09/2009  FINDINGS: Atelectasis in the lung bases.  Diffuse fatty infiltration of the liver. Surgical absence of the gallbladder. No bile duct dilatation. Calcified granulomas in the spleen. The pancreas, adrenal glands, kidneys, abdominal aorta, inferior vena cava, and retroperitoneal lymph nodes are unremarkable. Mild diffuse dilatation of small bowel with predominantly fluid-filled loops. Transition zone is demonstrated in the right lower quadrant with significant decompression of distal small bowel. This is consistent with  mechanical small bowel obstruction. Cause of obstruction is not identified. Statistically, adhesions should be considered. Colon is stool filled and mostly decompressed. No free air or free fluid in the abdomen. Minimal mesenteric edema. Periumbilical hernia containing fat. No bowel herniation. No bowel wall thickening.  Pelvis: The appendix is normal. Surgical absence of the uterus. Bladder wall is not thickened. No pelvic mass or lymphadenopathy. No free or loculated pelvic fluid collections. Degenerative changes in the lumbar spine.  IMPRESSION: Dilated fluid-filled small bowel with transition zone in the right lower quadrant consistent with mechanical obstruction. Cause is not demonstrated. Mild mesenteric edema. Midline abdominal wall hernia containing fat. No bowel herniation. Diffuse fatty infiltration of the liver. Surgical absence of gallbladder. Calcified granulomas in the spleen.   Electronically Signed   By: Lucienne Capers M.D.   On: 12/01/2014 22:03     Assessment/Plan Principal Problem:   SBO (small bowel obstruction) Active Problems:   Hypothyroidism   Chronic pain syndrome   1. Small bowel obstruction - CT scan does show a transition point. Patient has been placed on NG tube suction and on-call surgeon Dr. Harlow Asa will be seeing patient in consult. Patient will be kept nothing by mouth and on pain medications and gentle hydration for now. 2. Hypothyroidism - Synthroid has been ordered through IV. Repeat KUB has been ordered from a.m. 3. Chronic pain and history of fibromyalgia - patient is on when necessary IV Dilaudid. 4. History of asthma - presently not wheezing.   DVT Prophylaxis SCDs in anticipation of procedure we will hold off Lovenox.  Code Status: Full code.  Family Communication: Patient's husband at the bedside.  Disposition Plan: Admit to inpatient.    Jennamarie Goings N. [facility] Pager [phone].  If 7PM-7AM, please contact  night-coverage [email or web address] Password [email or web address] 12/02/2014, 1:19 AM

## 2014-12-03 ENCOUNTER — Encounter (HOSPITAL_COMMUNITY): Payer: Self-pay | Admitting: Internal Medicine

## 2014-12-03 ENCOUNTER — Inpatient Hospital Stay (HOSPITAL_COMMUNITY): Payer: 59

## 2014-12-03 DIAGNOSIS — Z8709 Personal history of other diseases of the respiratory system: Secondary | ICD-10-CM

## 2014-12-03 DIAGNOSIS — F1120 Opioid dependence, uncomplicated: Secondary | ICD-10-CM | POA: Diagnosis present

## 2014-12-03 DIAGNOSIS — R339 Retention of urine, unspecified: Secondary | ICD-10-CM | POA: Diagnosis present

## 2014-12-03 DIAGNOSIS — F1320 Sedative, hypnotic or anxiolytic dependence, uncomplicated: Secondary | ICD-10-CM | POA: Diagnosis present

## 2014-12-03 DIAGNOSIS — J069 Acute upper respiratory infection, unspecified: Secondary | ICD-10-CM | POA: Diagnosis present

## 2014-12-03 HISTORY — DX: Opioid dependence, uncomplicated: F11.20

## 2014-12-03 LAB — URINALYSIS, ROUTINE W REFLEX MICROSCOPIC
Glucose, UA: NEGATIVE mg/dL
KETONES UR: NEGATIVE mg/dL
Leukocytes, UA: NEGATIVE
Nitrite: NEGATIVE
Protein, ur: NEGATIVE mg/dL
Specific Gravity, Urine: 1.03 (ref 1.005–1.030)
UROBILINOGEN UA: 1 mg/dL (ref 0.0–1.0)
pH: 5.5 (ref 5.0–8.0)

## 2014-12-03 LAB — URINE MICROSCOPIC-ADD ON

## 2014-12-03 LAB — BASIC METABOLIC PANEL
ANION GAP: 6 (ref 5–15)
BUN: 25 mg/dL — ABNORMAL HIGH (ref 6–23)
CO2: 39 mmol/L — ABNORMAL HIGH (ref 19–32)
CREATININE: 0.69 mg/dL (ref 0.50–1.10)
Calcium: 8.4 mg/dL (ref 8.4–10.5)
Chloride: 97 mEq/L (ref 96–112)
GFR calc Af Amer: >90 mL/min (ref >90)
GFR calc non Af Amer: >90 mL/min (ref >90)
GLUCOSE: 136 mg/dL — AB (ref 70–99)
Potassium: 3.5 mmol/L (ref 3.5–5.1)
Sodium: 142 mmol/L (ref 135–145)

## 2014-12-03 LAB — GLUCOSE, CAPILLARY
Glucose-Capillary: 106 mg/dL — ABNORMAL HIGH (ref 70–99)
Glucose-Capillary: 123 mg/dL — ABNORMAL HIGH (ref 70–99)
Glucose-Capillary: 125 mg/dL — ABNORMAL HIGH (ref 70–99)
Glucose-Capillary: 130 mg/dL — ABNORMAL HIGH (ref 70–99)

## 2014-12-03 MED ORDER — POTASSIUM CHLORIDE IN NACL 20-0.9 MEQ/L-% IV SOLN
INTRAVENOUS | Status: DC
  Administered 2014-12-03: 1000 mL via INTRAVENOUS
  Filled 2014-12-03 (×3): qty 1000

## 2014-12-03 MED ORDER — SUMATRIPTAN SUCCINATE 6 MG/0.5ML ~~LOC~~ SOLN
6.0000 mg | SUBCUTANEOUS | Status: DC | PRN
  Administered 2014-12-03: 6 mg via SUBCUTANEOUS
  Filled 2014-12-03 (×2): qty 0.5

## 2014-12-03 MED ORDER — KETOROLAC TROMETHAMINE 30 MG/ML IJ SOLN
30.0000 mg | Freq: Four times a day (QID) | INTRAMUSCULAR | Status: DC | PRN
  Administered 2014-12-03 – 2014-12-05 (×3): 30 mg via INTRAVENOUS
  Filled 2014-12-03 (×3): qty 1

## 2014-12-03 NOTE — Progress Notes (Signed)
Pt down for CT of Abd. NG clamped. O2 4L via [place]. Pt complaining of Bad migraine. Tylenol supp. given

## 2014-12-03 NOTE — Progress Notes (Signed)
Called to room at 2030 per floor RN for pt in respiratory distress. Pt found sitting 90 degrees in bed, 02 sat 94% on 6 LNC. Lung sounds course, wheezes heard on auscultation. RR 25-30. Pt repositioned on bed with pillows supporting back. Per RN pt RR is slowly improving and she appears better now that her head is up. Patient admits to recent sick contacts for the influenza, but she has been vaccinated this season. PCXR placed and RN advised to notify [facility] on call.  Pt left resting in bed at 2050, po2 97 % on 6 LNC.

## 2014-12-03 NOTE — Progress Notes (Addendum)
At 2300 pt had not been able to void after sitting at [facility]. Bladder scan was performed and only 90 ml of urine seen. Walden Field NP notified, no new orders received. At 0300 pt bladder scanned again and only 120 ml of urine seen. [place] NP notified, awaiting orders. Noreene Larsson RN, BSN  Order received to In and out cath. 550 ml of amber urine returned. This was second in and out cath since yesterday. This RN to continue to monitor. Noreene Larsson RN, BSN

## 2014-12-03 NOTE — Progress Notes (Addendum)
Progress Note   Denise Case IRJ:[id number] DOB: May 06, 1956 DOA: 12/01/2014 PCP: Tamsen Roers, MD   Brief Narrative:   Denise Case is an 59 y.o. female PMH of hypothyroidism, fibromyalgia/chronic pain syndrome and asthma who was admitted 12/02/13 with a chief complaint of abdominal pain. A CT scan of abdomen/pelvis showed findings consistent with a small bowel obstruction.  Assessment/Plan:   Principal Problem:   SBO (small bowel obstruction)  Continue NG tube for gastric decompression, IV fluids, and bowel rest.  SBO thought to be secondary to adhesions from prior TAH/BSO.  Surgery following.  Encourage ambulation.  Repeat abdominal films in the a.m. X-ray this morning showed persistent partial small bowel obstruction.  Active Problems:   URI  No pneumonia on chest x-ray.  Respiratory virus panel negative.    Urinary retention  In and out catheterization as needed.  Foley if unable to void after ambulating.    Benzodiazepine dependence  Ativan ordered as needed.    Hypothyroidism  Continue IV Synthroid.    Chronic pain syndrome/opiate dependence  Dilaudid ordered as needed for pain.  Added a Duragesic patch due to concerns for withdrawal.    History of asthma   Stable.  No frank wheeze, but has a irritated cough.    DVT Prophylaxis  SCDs ordered.  Code Status: Full. Family Communication: No family at the bedside. Disposition Plan: Home when stable.   IV Access:    Peripheral IV   Procedures and diagnostic studies:   Ct Abdomen Pelvis W Contrast 12/01/2014: Dilated fluid-filled small bowel with transition zone in the right lower quadrant consistent with mechanical obstruction. Cause is not demonstrated. Mild mesenteric edema. Midline abdominal wall hernia containing fat. No bowel herniation. Diffuse fatty infiltration of the liver. Surgical absence of gallbladder. Calcified granulomas in the spleen.     Dg Abd 1 View 12/02/2014: Stable  partial small bowel obstruction.     Dg Chest Port 1 View 12/02/2014: Cardiomegaly with interstitial edema and basilar atelectasis.  Tiny bilateral pleural effusions.     Medical Consultants:    Dr. Harlow Asa, Surgery.  Anti-Infectives:    None.  Subjective:   Denise Case is more awake and alert today.  She tells me she passed some flatus. Has a moist non-productive cough and got short of breath last PM.  No BM yet.  Tells me "I'm ready to go home".   Objective:    Filed Vitals:   12/02/14 2000 12/02/14 2037 12/02/14 2112 12/03/14 0422  BP: 138/108 124/74  120/69  Pulse: 102 103  93  Temp: 99.1 F (37.3 C)   97.6 F (36.4 C)  TempSrc: Oral   Oral  Resp: 22 22  20   Height:      Weight:      SpO2: 93% 95% 93% 92%    Intake/Output Summary (Last 24 hours) at 12/03/14 0745 Last data filed at 12/03/14 0423  Gross per 24 hour  Intake    300 ml  Output   3150 ml  Net  -2850 ml    Exam: Gen:  NAD Cardiovascular:  Mildly tachy, No M/R/G Respiratory:  Lungs diminished Gastrointestinal:  Abdomen soft, NT/ND, + BS, NG tube currently clamped Extremities:  No C/E/C   Data Reviewed:    Labs: Basic Metabolic Panel:  Recent Labs Lab 12/01/14 1809 12/02/14 0558 12/03/14 0500  NA 139 140 142  K 4.0 4.3 3.5  CL 97 94* 97  CO2 27 33* 39*  GLUCOSE 143*  159* 136*  BUN 16 24* 25*  CREATININE 0.92 1.07 0.69  CALCIUM 10.2 9.1 8.4   GFR Estimated Creatinine Clearance: 117.8 mL/min (by C-G formula based on Cr of 0.69). Liver Function Tests:  Recent Labs Lab 12/01/14 1809 12/02/14 0558  AST 38* 39*  ALT 27 32  ALKPHOS 52 51  BILITOT 0.6 0.5  PROT 9.0* 8.1  ALBUMIN 4.5 3.9    Recent Labs Lab 12/01/14 1809  LIPASE 58   CBC:  Recent Labs Lab 12/01/14 1809 12/02/14 0558  WBC 16.8* 16.7*  NEUTROABS  --  13.8*  HGB 15.9* 15.5*  HCT 50.7* 50.5*  MCV 96.4 98.8  PLT 293 316   CBG:  Recent Labs Lab 12/02/14 1150 12/02/14 1706 12/02/14 1957  12/03/14 0006 12/03/14 0403  GLUCAP 151* 117* 118* 123* 130*   Microbiology No results found for this or any previous visit (from the past 240 hour(s)).   Medications:   . antiseptic oral rinse  7 mL Mouth Rinse q12n4p  . chlorhexidine  15 mL Mouth Rinse BID  . enoxaparin (LOVENOX) injection  75 mg Subcutaneous Q24H  . fentaNYL  25 mcg Transdermal Q72H  . levothyroxine  100 mcg Intravenous QAC breakfast   Continuous Infusions:    Time spent: 35 minutes with > 50% of time discussing current diagnostic test results, clinical impression and plan of care. .   LOS: 2 days   [facility] Pager [phone]. If unable to reach me by pager, please call my cell phone at [phone].  *Please refer to [email or web address], password [email or web address] to get updated schedule on who will round on this patient, as hospitalists switch teams weekly. If 7PM-7AM, please contact night-coverage at [email or web address], password [email or web address] for any overnight needs.  12/03/2014, 7:45 AM

## 2014-12-03 NOTE — Progress Notes (Signed)
Patient ID: Denise Case, female   DOB: [birth year], 59 y.o.   MRN: [id number]  General Surgery - [facility] - Progress Note  Subjective: Patient in bed, family at bedside.  Has not ambulated or been out of bed.  "Coughing" episode yesterday with SOB, resolved.  Unable to void, requiring I&O caths.  Denies flatus or BM.  Denies abdominal pain.  Objective: Vital signs in last 24 hours: Temp:  [97.6 F (36.4 C)-99.1 F (37.3 C)] 97.6 F (36.4 C) (01/03 0422) Pulse Rate:  [93-103] 93 (01/03 0422) Resp:  [18-22] 20 (01/03 0422) BP: (110-138)/(64-108) 120/69 mmHg (01/03 0422) SpO2:  [86 %-95 %] 92 % (01/03 0422) Last BM Date: 11/29/14  Intake/Output from previous day: 01/02 0701 - 01/03 0700 In: 300 [I.V.:300] Out: 4350 [Urine:1450; Emesis/NG output:2900]  Exam: HEENT - clear, not icteric Neck - soft Chest - clear bilaterally Cor - RRR, no murmur Abd - soft, obese; rare BS present; mild tenderness LUQ; no mass Ext - no significant edema Neuro - grossly intact, no focal deficits  Lab Results:   Recent Labs  12/01/14 1809 12/02/14 0558  WBC 16.8* 16.7*  HGB 15.9* 15.5*  HCT 50.7* 50.5*  PLT 293 316     Recent Labs  12/02/14 0558 12/03/14 0500  NA 140 142  K 4.3 3.5  CL 94* 97  CO2 33* 39*  GLUCOSE 159* 136*  BUN 24* 25*  CREATININE 1.07 0.69  CALCIUM 9.1 8.4    Studies/Results: Dg Abd 1 View  12/02/2014   CLINICAL DATA:  Follow-up small bowel obstruction. Generalized abdominal pain currently.  EXAM: ABDOMEN - 1 VIEW  COMPARISON:  CT abdomen and pelvis yesterday.  FINDINGS: Moderately distended loops of small bowel in the left upper quadrant, not significantly changed since yesterday's CT. No suggestion of free air on the supine image. Expected stool burden throughout normal caliber colon. Contrast material within the urinary bladder from yesterday's CT. Round opacity in the left upper quadrant projecting below the stomach is likely a  fluid-filled loop of small bowel en face. Nasogastric tube tip in the mid body of the stomach.  IMPRESSION: Stable partial small bowel obstruction.   Electronically Signed   By: Evangeline Dakin M.D.   On: 12/02/2014 09:48   Ct Abdomen Pelvis W Contrast  12/01/2014   CLINICAL DATA:  Right lower quadrant pain common nausea and vomiting since this morning. Constipation. History of cholecystectomy.  EXAM: CT ABDOMEN AND PELVIS WITH CONTRAST  TECHNIQUE: Multidetector CT imaging of the abdomen and pelvis was performed using the standard protocol following bolus administration of intravenous contrast.  CONTRAST:  16mL OMNIPAQUE IOHEXOL 300 MG/ML SOLN, 112mL OMNIPAQUE IOHEXOL 300 MG/ML SOLN  COMPARISON:  12/09/2009  FINDINGS: Atelectasis in the lung bases.  Diffuse fatty infiltration of the liver. Surgical absence of the gallbladder. No bile duct dilatation. Calcified granulomas in the spleen. The pancreas, adrenal glands, kidneys, abdominal aorta, inferior vena cava, and retroperitoneal lymph nodes are unremarkable. Mild diffuse dilatation of small bowel with predominantly fluid-filled loops. Transition zone is demonstrated in the right lower quadrant with significant decompression of distal small bowel. This is consistent with mechanical small bowel obstruction. Cause of obstruction is not identified. Statistically, adhesions should be considered. Colon is stool filled and mostly decompressed. No free air or free fluid in the abdomen. Minimal mesenteric edema. Periumbilical hernia containing fat. No bowel herniation. No bowel wall thickening.  Pelvis: The appendix is normal. Surgical absence of the uterus. Bladder wall  is not thickened. No pelvic mass or lymphadenopathy. No free or loculated pelvic fluid collections. Degenerative changes in the lumbar spine.  IMPRESSION: Dilated fluid-filled small bowel with transition zone in the right lower quadrant consistent with mechanical obstruction. Cause is not demonstrated.  Mild mesenteric edema. Midline abdominal wall hernia containing fat. No bowel herniation. Diffuse fatty infiltration of the liver. Surgical absence of gallbladder. Calcified granulomas in the spleen.   Electronically Signed   By: Lucienne Capers M.D.   On: 12/01/2014 22:03   Dg Chest Port 1 View  12/02/2014   CLINICAL DATA:  Initial encounter for shortness of breath  EXAM: PORTABLE CHEST - 1 VIEW  COMPARISON:  05/17/2014.  FINDINGS: 2048 hrs. Lung volumes are low. There is bibasilar atelectasis. Vascular congestion noted probable associated interstitial pulmonary edema. The cardio pericardial silhouette is enlarged. Small bilateral pleural effusions are evident. The NG tube passes into the stomach although the distal tip position is not included on the film.  IMPRESSION: Cardiomegaly with interstitial edema and basilar atelectasis.  Tiny bilateral pleural effusions.   Electronically Signed   By: Misty Stanley M.D.   On: 12/02/2014 21:06    Assessment / Plan: Small bowel obstruction Continue NG, IV hydration, NPO SBO likely secondary to adhesions in pelvis from TAH&BSO Encourage ambulation, OOB to chair at least today  May need Foley cath if unable to void today with large residual volume (550cc) Will repeat AXR in AM 1/3. Appreciate medical service management of this patient.  Earnstine Regal, MD, [facility] Office: [phone]  12/03/2014

## 2014-12-04 ENCOUNTER — Inpatient Hospital Stay (HOSPITAL_COMMUNITY): Payer: 59

## 2014-12-04 LAB — BASIC METABOLIC PANEL
ANION GAP: 9 (ref 5–15)
BUN: 17 mg/dL (ref 6–23)
CO2: 37 mmol/L — ABNORMAL HIGH (ref 19–32)
Calcium: 8.6 mg/dL (ref 8.4–10.5)
Chloride: 94 mEq/L — ABNORMAL LOW (ref 96–112)
Creatinine, Ser: 0.62 mg/dL (ref 0.50–1.10)
GFR calc non Af Amer: >90 mL/min (ref >90)
GLUCOSE: 106 mg/dL — AB (ref 70–99)
POTASSIUM: 3.3 mmol/L — AB (ref 3.5–5.1)
SODIUM: 140 mmol/L (ref 135–145)

## 2014-12-04 LAB — RESPIRATORY VIRUS PANEL
Adenovirus: NOT DETECTED
INFLUENZA A: NOT DETECTED
INFLUENZA B 1: NOT DETECTED
Influenza A H1: NOT DETECTED
Influenza A H3: NOT DETECTED
Metapneumovirus: NOT DETECTED
PARAINFLUENZA 2 A: NOT DETECTED
PARAINFLUENZA 3 A: NOT DETECTED
Parainfluenza 1: NOT DETECTED
RESPIRATORY SYNCYTIAL VIRUS B: NOT DETECTED
Respiratory Syncytial Virus A: NOT DETECTED
Rhinovirus: NOT DETECTED

## 2014-12-04 LAB — CBC
HCT: 43.9 % (ref 36.0–46.0)
Hemoglobin: 13.6 g/dL (ref 12.0–15.0)
MCH: 30.4 pg (ref 26.0–34.0)
MCHC: 31 g/dL (ref 30.0–36.0)
MCV: 98.2 fL (ref 78.0–100.0)
Platelets: 221 10*3/uL (ref 150–400)
RBC: 4.47 MIL/uL (ref 3.87–5.11)
RDW: 14.4 % (ref 11.5–15.5)
WBC: 11.1 10*3/uL — ABNORMAL HIGH (ref 4.0–10.5)

## 2014-12-04 LAB — MAGNESIUM: MAGNESIUM: 2.4 mg/dL (ref 1.5–2.5)

## 2014-12-04 MED ORDER — POTASSIUM CHLORIDE IN NACL 40-0.9 MEQ/L-% IV SOLN
INTRAVENOUS | Status: DC
  Administered 2014-12-04 – 2014-12-05 (×2): 100 mL/h via INTRAVENOUS
  Filled 2014-12-04 (×7): qty 1000

## 2014-12-04 NOTE — Progress Notes (Signed)
Progress Note   Denise Case TWS:[id number] DOB: 1956-04-04 DOA: 12/01/2014 PCP: Tamsen Roers, MD   Brief Narrative:   Denise Case is an 59 y.o. female PMH of hypothyroidism, fibromyalgia/chronic pain syndrome and asthma who was admitted 12/02/13 with a chief complaint of abdominal pain. A CT scan of abdomen/pelvis showed findings consistent with a small bowel obstruction.  Assessment/Plan:   Principal Problem:   SBO (small bowel obstruction)  Initially treated with NG tube for gastric decompression, IV fluids, and bowel rest. Surgery has ordered intermittent clamping of NG tube or liquids now.  SBO thought to be secondary to adhesions from prior TAH/BSO. Abdominal films 12/03/14 showed worsening SBO despite NG tube decompression. Repeat abdominal films done today show decreased distention.  Surgery following.  Encourage ambulation.  Active Problems:   URI  No pneumonia on chest x-ray.  Respiratory virus panel negative.    Urinary retention  In and out catheterization as needed.  Foley if unable to void after ambulating.    Benzodiazepine dependence  Ativan ordered as needed.    Hypothyroidism  Continue IV Synthroid.    Chronic pain syndrome/opiate dependence  Dilaudid ordered as needed for pain.  Added a Duragesic patch due to concerns for withdrawal (on the equivalent of 80 mg morphine daily).    History of asthma   Stable.  No frank wheeze, but has a irritated cough.    DVT Prophylaxis  SCDs ordered.  Code Status: Full. Family Communication: Husband updated at the bedside. Disposition Plan: Home when stable.   IV Access:    Peripheral IV   Procedures and diagnostic studies:   Ct Abdomen Pelvis W Contrast 12/01/2014: Dilated fluid-filled small bowel with transition zone in the right lower quadrant consistent with mechanical obstruction. Cause is not demonstrated. Mild mesenteric edema. Midline abdominal wall hernia containing fat. No bowel  herniation. Diffuse fatty infiltration of the liver. Surgical absence of gallbladder. Calcified granulomas in the spleen.     Dg Abd 1 View 12/02/2014: Stable partial small bowel obstruction.     Dg Chest Port 1 View 12/02/2014: Cardiomegaly with interstitial edema and basilar atelectasis.  Tiny bilateral pleural effusions.      Dg Abd 2 Views 12/03/2014: 1. Radiographic worsening of small bowel obstruction despite nasogastric tube.     Medical Consultants:    Dr. Harlow Asa, Surgery.  Anti-Infectives:    None.  Subjective:   [name] the home. She is now passing flatus and having bowel movements. She is urinating without difficulty and has not required a Foley catheter to be placed. No complaints of abdominal pain.  Objective:    Filed Vitals:   12/03/14 1353 12/03/14 2028 12/04/14 0431 12/04/14 0514  BP: 132/74 135/68 121/78 123/68  Pulse: 88 90 77 85  Temp: 98.9 F (37.2 C) 98.8 F (37.1 C) 98.6 F (37 C) 97.8 F (36.6 C)  TempSrc: Oral Oral Oral Oral  Resp: 20 16 16 16   Height:      Weight:      SpO2: 90% 92% 98% 95%    Intake/Output Summary (Last 24 hours) at 12/04/14 0750 Last data filed at 12/04/14 0354  Gross per 24 hour  Intake    305 ml  Output   2550 ml  Net  -2245 ml    Exam: Gen:  NAD Cardiovascular:  RRR, No M/R/G Respiratory:  Lungs diminished Gastrointestinal:  Abdomen soft, NT/ND, + BS, NG tube currently clamped Extremities:  No C/E/C   Data Reviewed:  Labs: Basic Metabolic Panel:  Recent Labs Lab 12/01/14 1809 12/02/14 0558 12/03/14 0500  NA 139 140 142  K 4.0 4.3 3.5  CL 97 94* 97  CO2 27 33* 39*  GLUCOSE 143* 159* 136*  BUN 16 24* 25*  CREATININE 0.92 1.07 0.69  CALCIUM 10.2 9.1 8.4   GFR Estimated Creatinine Clearance: 117.8 mL/min (by C-G formula based on Cr of 0.69). Liver Function Tests:  Recent Labs Lab 12/01/14 1809 12/02/14 0558  AST 38* 39*  ALT 27 32  ALKPHOS 52 51  BILITOT 0.6 0.5  PROT 9.0* 8.1    ALBUMIN 4.5 3.9    Recent Labs Lab 12/01/14 1809  LIPASE 58   CBC:  Recent Labs Lab 12/01/14 1809 12/02/14 0558  WBC 16.8* 16.7*  NEUTROABS  --  13.8*  HGB 15.9* 15.5*  HCT 50.7* 50.5*  MCV 96.4 98.8  PLT 293 316   CBG:  Recent Labs Lab 12/02/14 1957 12/03/14 0006 12/03/14 0403 12/03/14 0747 12/03/14 1206  GLUCAP 118* 123* 130* 125* 106*   Microbiology No results found for this or any previous visit (from the past 240 hour(s)).   Medications:   . antiseptic oral rinse  7 mL Mouth Rinse q12n4p  . chlorhexidine  15 mL Mouth Rinse BID  . enoxaparin (LOVENOX) injection  75 mg Subcutaneous Q24H  . fentaNYL  25 mcg Transdermal Q72H  . levothyroxine  100 mcg Intravenous QAC breakfast   Continuous Infusions: . 0.9 % NaCl with KCl 20 mEq / L 1,000 mL (12/03/14 1151)    Time spent: 25 minutes. .   LOS: 3 days   [facility] Pager [phone]. If unable to reach me by pager, please call my cell phone at [phone].  *Please refer to [email or web address], password [email or web address] to get updated schedule on who will round on this patient, as hospitalists switch teams weekly. If 7PM-7AM, please contact night-coverage at [email or web address], password [email or web address] for any overnight needs.  12/04/2014, 7:50 AM

## 2014-12-04 NOTE — Progress Notes (Signed)
Subjective: She wants the tubes out and wants to go home.  I don't think she is ready. She has allot of drainage, Cannister is full.  Sump was not working and I replaced filter, and she is still putting out a fair amount of cloudy green fluid.  She does have BS.  She is tired of bed here also.  Objective: Vital signs in last 24 hours: Temp:  [97.8 F (36.6 C)-98.9 F (37.2 C)] 97.8 F (36.6 C) (01/04 0514) Pulse Rate:  [77-90] 85 (01/04 0514) Resp:  [16-20] 16 (01/04 0514) BP: (121-135)/(68-78) 123/68 mmHg (01/04 0514) SpO2:  [90 %-98 %] 95 % (01/04 0514) Last BM Date: 12/03/14 NG 1800 recorded. 4 stools recorded. NPO Afebrile, VSS No labs today SBO was reported to be worse on film yesterday Intake/Output from previous day: 01/03 0701 - 01/04 0700 In: 305 [I.V.:285; NG/GT:20] Out: 2550 [Urine:750; Emesis/NG output:1800] Intake/Output this shift:    General appearance: alert, cooperative and no distress GI: soft, I don't think she is distended, with her abdomen it's difficulty to tell.  + BM/flatus  Lab Results:   Recent Labs  12/01/14 1809 12/02/14 0558  WBC 16.8* 16.7*  HGB 15.9* 15.5*  HCT 50.7* 50.5*  PLT 293 316    BMET  Recent Labs  12/02/14 0558 12/03/14 0500  NA 140 142  K 4.3 3.5  CL 94* 97  CO2 33* 39*  GLUCOSE 159* 136*  BUN 24* 25*  CREATININE 1.07 0.69  CALCIUM 9.1 8.4   PT/INR No results for input(s): LABPROT, INR in the last 72 hours.   Recent Labs Lab 12/01/14 1809 12/02/14 0558  AST 38* 39*  ALT 27 32  ALKPHOS 52 51  BILITOT 0.6 0.5  PROT 9.0* 8.1  ALBUMIN 4.5 3.9     Lipase     Component Value Date/Time   LIPASE 58 12/01/2014 1809     Studies/Results: Dg Abd 1 View  12/02/2014   CLINICAL DATA:  Follow-up small bowel obstruction. Generalized abdominal pain currently.  EXAM: ABDOMEN - 1 VIEW  COMPARISON:  CT abdomen and pelvis yesterday.  FINDINGS: Moderately distended loops of small bowel in the left upper quadrant,  not significantly changed since yesterday's CT. No suggestion of free air on the supine image. Expected stool burden throughout normal caliber colon. Contrast material within the urinary bladder from yesterday's CT. Round opacity in the left upper quadrant projecting below the stomach is likely a fluid-filled loop of small bowel en face. Nasogastric tube tip in the mid body of the stomach.  IMPRESSION: Stable partial small bowel obstruction.   Electronically Signed   By: Evangeline Dakin M.D.   On: 12/02/2014 09:48   Dg Chest Port 1 View  12/02/2014   CLINICAL DATA:  Initial encounter for shortness of breath  EXAM: PORTABLE CHEST - 1 VIEW  COMPARISON:  05/17/2014.  FINDINGS: 2048 hrs. Lung volumes are low. There is bibasilar atelectasis. Vascular congestion noted probable associated interstitial pulmonary edema. The cardio pericardial silhouette is enlarged. Small bilateral pleural effusions are evident. The NG tube passes into the stomach although the distal tip position is not included on the film.  IMPRESSION: Cardiomegaly with interstitial edema and basilar atelectasis.  Tiny bilateral pleural effusions.   Electronically Signed   By: Misty Stanley M.D.   On: 12/02/2014 21:06   Dg Abd 2 Views  12/03/2014   CLINICAL DATA:  weakness, sob, lethargy, abd pain, x 2 days f/u sbo, prior cholecystectomy 6/15, prior bladder surgery,  prior hysterectomy, hx  EXAM: ABDOMEN - 2 VIEW  COMPARISON:  12/02/2014  FINDINGS: Nasogastric tube remains in the decompressed stomach. Multiple dilated small bowel loops in the mid abdomen, with fluid levels on the erect radiograph, increased in number and degree of dilatation since prior study. No free air. Normal distribution of gas and stool throughout the nondilated colon.  Bilateral small pelvic phleboliths. Surgical clips right upper abdomen.  Regional bones unremarkable. Patchy atelectasis or infiltrates in the visualized lung bases left greater than right, stable.  IMPRESSION: 1.  Radiographic worsening of small bowel obstruction despite nasogastric tube.   Electronically Signed   By: Arne Cleveland M.D.   On: 12/03/2014 09:10    Medications: . antiseptic oral rinse  7 mL Mouth Rinse q12n4p  . chlorhexidine  15 mL Mouth Rinse BID  . enoxaparin (LOVENOX) injection  75 mg Subcutaneous Q24H  . fentaNYL  25 mcg Transdermal Q72H  . levothyroxine  100 mcg Intravenous QAC breakfast    Assessment/Plan SBO URI Benzodiazepine dependence Chronic pain/opiated dependence Asthma Hypothyroid Body mass index is 52.89 kg/(m^2).  COPD/Home O2/hx of tobacco use AODM Migraines Depression/anxiety/panic attacks by history   Plan:  Clamping trials, some sips of clears, recheck film and labs.     LOS: 3 days    Akim Watkinson 12/04/2014

## 2014-12-04 NOTE — Progress Notes (Signed)
Pt OOB with1 assist to bedside commode. Had lg BM and urinated, when she got up she got very dizzy and pale. Had to sit back down for a few minutes then assisted back to bed. O2 humidified with water. Pt fell asleep.

## 2014-12-05 LAB — BASIC METABOLIC PANEL
ANION GAP: 10 (ref 5–15)
BUN: 13 mg/dL (ref 6–23)
CALCIUM: 8.4 mg/dL (ref 8.4–10.5)
CO2: 32 mmol/L (ref 19–32)
Chloride: 96 mEq/L (ref 96–112)
Creatinine, Ser: 0.56 mg/dL (ref 0.50–1.10)
GFR calc Af Amer: >90 mL/min (ref >90)
GFR calc non Af Amer: >90 mL/min (ref >90)
Glucose, Bld: 93 mg/dL (ref 70–99)
Potassium: 3.4 mmol/L — ABNORMAL LOW (ref 3.5–5.1)
SODIUM: 138 mmol/L (ref 135–145)

## 2014-12-05 NOTE — Progress Notes (Signed)
  Subjective: She want to go home, had 4 small BM's since yesterday.  She had 600 more in than what came out of the NG.  She says she is having allot of flatus.  Objective: Vital signs in last 24 hours: Temp:  [98 F (36.7 C)-98.2 F (36.8 C)] 98.2 F (36.8 C) (01/05 0554) Pulse Rate:  [88-89] 88 (01/05 0554) Resp:  [16] 16 (01/05 0554) BP: (101-130)/(55-56) 130/55 mmHg (01/05 0554) SpO2:  [94 %-97 %] 94 % (01/05 0554) Last BM Date: 12/17/14 2970 PO recorded 2300 from the NG BM yesterday and today so far Afebrile, VSS K+ 3.4 Film yesterday was better. Intake/Output from previous day: 12/17/2022 0701 - 01/05 0700 In: 6255.4 [P.O.:2970; I.V.:3285.4] Out: 3100 [Urine:800; Emesis/NG output:2300] Intake/Output this shift:    General appearance: alert, cooperative and no distress GI: soft, non-tender; bowel sounds normal; no masses,  no organomegaly and he abdomen is very large and she is sitting up at bedside, so I can't really tell if it is distended.  Lab Results:   Recent Labs  12-17-2014 0902  WBC 11.1*  HGB 13.6  HCT 43.9  PLT 221    BMET  Recent Labs  2014/12/17 0902 12/05/14 0445  NA 140 138  K 3.3* 3.4*  CL 94* 96  CO2 37* 32  GLUCOSE 106* 93  BUN 17 13  CREATININE 0.62 0.56  CALCIUM 8.6 8.4   PT/INR No results for input(s): LABPROT, INR in the last 72 hours.   Recent Labs Lab 12/01/14 1809 12/02/14 0558  AST 38* 39*  ALT 27 32  ALKPHOS 52 51  BILITOT 0.6 0.5  PROT 9.0* 8.1  ALBUMIN 4.5 3.9     Lipase     Component Value Date/Time   LIPASE 58 12/01/2014 1809     Studies/Results: Dg Abd 2 Views  2014-12-17   CLINICAL DATA:  Small bowel obstruction.  EXAM: ABDOMEN - 2 VIEW  COMPARISON:  12/03/2014  FINDINGS: Nasogastric tube is seen with tip in the distal stomach. Decreased small bowel dilatation is seen since previous study. No evidence of free air. Right upper quadrant surgical clips again noted.  IMPRESSION: Decreased small bowel dilatation  since prior exam.   Electronically Signed   By: Earle Gell M.D.   On: 12/17/14 13:41    Medications: . antiseptic oral rinse  7 mL Mouth Rinse q12n4p  . chlorhexidine  15 mL Mouth Rinse BID  . enoxaparin (LOVENOX) injection  75 mg Subcutaneous Q24H  . fentaNYL  25 mcg Transdermal Q72H  . levothyroxine  100 mcg Intravenous QAC breakfast    Assessment/Plan SBO URI Benzodiazepine dependence Chronic pain/opiated dependence Asthma Hypothyroid Body mass index is 52.89 kg/(m^2).  COPD/Home O2/hx of tobacco use AODM Migraines Depression/anxiety/panic attacks by history   Plan:  Pull NG give her full liquids and see how she does.     LOS: 4 days    Demarcus Thielke 12/05/2014

## 2014-12-05 NOTE — Care Management Note (Signed)
CARE MANAGEMENT NOTE 12/05/2014  Patient:  Denise Case, Denise Case   Account Number:  [id number]  Date Initiated:  12/05/2014  Documentation initiated by:  Marney Doctor  Subjective/Objective Assessment:   59 yo admitted with SBO     Action/Plan:   From home with spouse   Anticipated DC Date:  12/08/2014   Anticipated DC Plan:  [place]  CM consult      Choice offered to / List presented to:             Status of service:  In process, will continue to follow Medicare Important Message given?   (If response is "NO", the following Medicare IM given date fields will be blank) Date Medicare IM given:   Medicare IM given by:   Date Additional Medicare IM given:   Additional Medicare IM given by:    Discharge Disposition:    Per UR Regulation:  Reviewed for med. necessity/level of care/duration of stay  If discussed at [place] of Stay Meetings, dates discussed:    Comments:  12/05/14 Marney Doctor RN,BSN,NCM [phone] Chart reviewed and CM following for DC needs.

## 2014-12-05 NOTE — Progress Notes (Signed)
Dr. Truman Hayward notified on a potassium of 3.4. No new orders given.

## 2014-12-05 NOTE — Progress Notes (Signed)
[facility] PROGRESS NOTE.  Denise Case KWI:[id number] DOB: November 06, 1956    PCP:   Tamsen Roers, MD   Denise Case is an 59 y.o. female with hx of benzodiazepine dependence, along with narcotic dependence, morbid obesity, fibromyalgia, asthma, thyroid disease, admitted for SBO.  Surgery has been following. She had distention yesterday and had NGT, she passed flatus, and had BM yesterday.  She felt better today.  Rewiew of Systems:  Constitutional: Negative for malaise, fever and chills. No significant weight loss or weight gain Eyes: Negative for eye pain, redness and discharge, diplopia, visual changes, or flashes of light. ENMT: Negative for ear pain, hoarseness, nasal congestion, sinus pressure and sore throat. No headaches; tinnitus, drooling, or problem swallowing. Cardiovascular: Negative for chest pain, palpitations, diaphoresis, dyspnea and peripheral edema. ; No orthopnea, PND Respiratory: Negative for cough, hemoptysis, wheezing and stridor. No pleuritic chestpain. Gastrointestinal: Negative for nausea, vomiting, diarrhea,  melena, blood in stool, hematemesis, jaundice and rectal bleeding.    Genitourinary: Negative for frequency, dysuria, incontinence,flank pain and hematuria; Musculoskeletal: Negative for back pain and neck pain. Negative for swelling and trauma.;  Skin: . Negative for pruritus, rash, abrasions, bruising and skin lesion.; ulcerations Neuro: Negative for headache, lightheadedness and neck stiffness. Negative for weakness, altered level of consciousness , altered mental status, extremity weakness, burning feet, involuntary movement, seizure and syncope.  Psych: negative for anxiety, depression, insomnia, tearfulness, panic attacks, hallucinations, paranoia, suicidal or homicidal ideation    Past Medical History  Diagnosis Date  . COPD (chronic obstructive pulmonary disease)   . Fibromyalgia   . Thyroid disease   . Morbid obesity   . GERD  (gastroesophageal reflux disease)   . Hyperlipidemia   . Asthma   . Diverticulitis   . Lumbar sprain   . Diabetes mellitus without complication   . Pneumonia 2015  . Hypothyroidism   . Migraine   . Depression   . Anxiety   . History of kidney stones   . Constipation due to pain medication   . Requires supplemental oxygen     PRN uses 1.5 Liters  . Neuropathy     feet and legs  . Tingling sensation     hands  . Dermatitis   . History of panic attacks   . SBO (small bowel obstruction) 12/01/2014  . Fatty liver 12/02/2014  . Opiate dependence 12/03/2014    Past Surgical History  Procedure Laterality Date  . Bladder tack      X 2  . Abdominal hysterectomy    . Cholecystectomy N/A 05/25/2014    Procedure: LAPAROSCOPIC CHOLECYSTECTOMY WITH INTRAOPERATIVE CHOLANGIOGRAM;  Surgeon: Odis Hollingshead, MD;  Location: [facility];  Service: General;  Laterality: N/A;    Medications:  HOME MEDS: Prior to Admission medications   Medication Sig Start Date End Date Taking? Authorizing Provider  ALPRAZolam Duanne Moron) 1 MG tablet Take 1 mg by mouth 3 (three) times daily as needed for anxiety (anxiety).    Yes Historical Provider, MD  amitriptyline (ELAVIL) 25 MG tablet Take 25 mg by mouth at bedtime.   Yes Historical Provider, MD  CALCIUM-VITAMIN D PO Take 1 tablet by mouth daily.   Yes Historical Provider, MD  Cinnamon 500 MG capsule Take 500 mg by mouth 2 (two) times daily.   Yes Historical Provider, MD  cyanocobalamin 1000 MCG tablet Take 1,000 mcg by mouth daily.    Yes Historical Provider, MD  cyclobenzaprine (FLEXERIL) 10 MG tablet Take 10 mg by mouth 3 (three) times  daily as needed for muscle spasms.   Yes Historical Provider, MD  dexlansoprazole (DEXILANT) 60 MG capsule Take 60 mg by mouth daily.   Yes Historical Provider, MD  fish oil-omega-3 fatty acids 1000 MG capsule Take 1 g by mouth 3 (three) times daily with meals.    Yes Historical Provider, MD  FLUoxetine (PROZAC) 20 MG capsule Take  20 mg by mouth daily.   Yes Historical Provider, MD  flurazepam (DALMANE) 30 MG capsule Take 30 mg by mouth at bedtime.    Yes Historical Provider, MD  gabapentin (NEURONTIN) 800 MG tablet Take 400-800 mg by mouth 3 (three) times daily. 0.5 tablet (400 mg) in the morning and then 0.5 tablet (400 mg) 8 hours later and 1 tablet (800mg ) at bedtime.   Yes Historical Provider, MD  levothyroxine (SYNTHROID) 200 MCG tablet Take 200 mcg by mouth daily before breakfast.    Yes Historical Provider, MD  Menthol, Topical Analgesic, (MINERAL ICE) 2 % GEL Apply 1 application topically as needed (back and feet pain).   Yes Historical Provider, MD  mineral oil liquid Take 15-30 mLs by mouth daily as needed for mild constipation (constipation). Constipation   Yes Historical Provider, MD  Red Yeast Rice Extract (RED YEAST RICE PO) Take 1,000 mg by mouth daily before breakfast.   Yes Historical Provider, MD  Tapentadol HCl (NUCYNTA ER) 100 MG TB12 Take 100 mg by mouth 2 (two) times daily before a meal.    Yes Historical Provider, MD  theophylline (THEO-24) 300 MG 24 hr capsule Take 300 mg by mouth 2 (two) times daily.    Yes Historical Provider, MD  vitamin C (ASCORBIC ACID) 500 MG tablet Take 500 mg by mouth at bedtime.    Yes Historical Provider, MD  ergocalciferol (VITAMIN D2) 50000 UNITS capsule Take 50,000 Units by mouth once a week. Friday    Historical Provider, MD  ondansetron (ZOFRAN) 4 MG tablet Take 1 tablet (4 mg total) by mouth every 4 (four) hours as needed for nausea or vomiting. 05/04/14   Jackolyn Confer, MD  SUMAtriptan (IMITREX) 100 MG tablet Take 100 mg by mouth every 2 (two) hours as needed for migraine or headache. May repeat in 2 hours if headache persists or recurs.    Historical Provider, MD  traMADol (ULTRAM) 50 MG tablet Take 1-2 tablets (50-100 mg total) by mouth every 6 (six) hours as needed. 05/26/14   Jackolyn Confer, MD     Allergies:  Allergies  Allergen Reactions  . Montelukast Sodium  Anaphylaxis and Swelling  . Doxycycline Other (See Comments)    Strips lining off of top of tongue, like thrush  . Hydrocodone-Acetaminophen Other (See Comments)    Migraines (high doses)    Social History:   reports that she has been smoking Cigarettes.  She has a 37 pack-year smoking history. She has never used smokeless tobacco. She reports that she does not drink alcohol or use illicit drugs.  Family History: History reviewed. No pertinent family history.   Physical Exam: Filed Vitals:   12/04/14 0431 12/04/14 0514 12/04/14 2133 12/05/14 0554  BP: 121/78 123/68 101/56 130/55  Pulse: 77 85 89 88  Temp: 98.6 F (37 C) 97.8 F (36.6 C) 98 F (36.7 C) 98.2 F (36.8 C)  TempSrc: Oral Oral Oral Oral  Resp: 16 16 16 16   Height:      Weight:      SpO2: 98% 95% 97% 94%   Blood pressure 130/55, pulse 88, temperature 98.2  F (36.8 C), temperature source Oral, resp. rate 16, height 5\' 6"  (1.676 m), weight 148.553 kg (327 lb 8 oz), SpO2 94 %.  GEN:  Pleasant  patient lying in the stretcher in no acute distress; cooperative with exam. PSYCH:  alert and oriented x4; does not appear anxious or depressed; affect is appropriate. HEENT: Mucous membranes pink and anicteric; PERRLA; EOM intact; no cervical lymphadenopathy nor thyromegaly or carotid bruit; no JVD; There were no stridor. Neck is very supple. Breasts:: Not examined CHEST WALL: No tenderness CHEST: Normal respiration, clear to auscultation bilaterally.  HEART: Regular rate and rhythm.  There are no murmur, rub, or gallops.   BACK: No kyphosis or scoliosis; no CVA tenderness ABDOMEN: soft and non-tender; no masses, no organomegaly, no abdominal bowel sounds; no pannus; no intertriginous candida. There is no rebound and no distention. Rectal Exam: Not done EXTREMITIES: No bone or joint deformity; age-appropriate arthropathy of the hands and knees; no edema; no ulcerations.  There is no calf tenderness. Genitalia: not  examined PULSES: 2+ and symmetric SKIN: Normal hydration no rash or ulceration CNS: Cranial nerves 2-12 grossly intact no focal lateralizing neurologic deficit.  Speech is fluent; uvula elevated with phonation, facial symmetry and tongue midline. DTR are normal bilaterally, cerebella exam is intact, barbinski is negative and strengths are equaled bilaterally.  No sensory loss.   Labs on Admission:  Basic Metabolic Panel:  Recent Labs Lab 12/01/14 1809 12/02/14 0558 12/03/14 0500 12/04/14 0902 12/04/14 0903 12/05/14 0445  NA 139 140 142 140  --  138  K 4.0 4.3 3.5 3.3*  --  3.4*  CL 97 94* 97 94*  --  96  CO2 27 33* 39* 37*  --  32  GLUCOSE 143* 159* 136* 106*  --  93  BUN 16 24* 25* 17  --  13  CREATININE 0.92 1.07 0.69 0.62  --  0.56  CALCIUM 10.2 9.1 8.4 8.6  --  8.4  MG  --   --   --   --  2.4  --    Liver Function Tests:  Recent Labs Lab 12/01/14 1809 12/02/14 0558  AST 38* 39*  ALT 27 32  ALKPHOS 52 51  BILITOT 0.6 0.5  PROT 9.0* 8.1  ALBUMIN 4.5 3.9    Recent Labs Lab 12/01/14 1809  LIPASE 58   No results for input(s): AMMONIA in the last 168 hours. CBC:  Recent Labs Lab 12/01/14 1809 12/02/14 0558 12/04/14 0902  WBC 16.8* 16.7* 11.1*  NEUTROABS  --  13.8*  --   HGB 15.9* 15.5* 13.6  HCT 50.7* 50.5* 43.9  MCV 96.4 98.8 98.2  PLT 293 316 221   Cardiac Enzymes: No results for input(s): CKTOTAL, CKMB, CKMBINDEX, TROPONINI in the last 168 hours.  CBG:  Recent Labs Lab 12/02/14 1957 12/03/14 0006 12/03/14 0403 12/03/14 0747 12/03/14 1206  GLUCAP 118* 123* 130* 125* 106*     Radiological Exams on Admission: Dg Abd 2 Views  12/04/2014   CLINICAL DATA:  Small bowel obstruction.  EXAM: ABDOMEN - 2 VIEW  COMPARISON:  12/03/2014  FINDINGS: Nasogastric tube is seen with tip in the distal stomach. Decreased small bowel dilatation is seen since previous study. No evidence of free air. Right upper quadrant surgical clips again noted.  IMPRESSION:  Decreased small bowel dilatation since prior exam.   Electronically Signed   By: Earle Gell M.D.   On: 12/04/2014 13:41   Assessment/Plan Present on Admission:  . SBO (small  bowel obstruction) . Hypothyroidism . Chronic pain syndrome . Fatty liver . Urinary retention . Opiate dependence . Benzodiazepine dependence . Acute upper respiratory infection  PLAN: SBO, decrease Narcotic use, limit Benzo.  Replete K important for return of bowel fx.  Surgery following.  Clear liquid as per surgery.  Will continue to evaluate.  Will need to check TSH also. Other plans as per orders.  Code Status: FULL Haskel Khan, MD. [facility] Pager [phone] 7pm to 7am.  12/05/2014, 10:13 AM

## 2014-12-06 LAB — BASIC METABOLIC PANEL
Anion gap: 9 (ref 5–15)
BUN: 9 mg/dL (ref 6–23)
CO2: 32 mmol/L (ref 19–32)
Calcium: 8.8 mg/dL (ref 8.4–10.5)
Chloride: 102 mEq/L (ref 96–112)
Creatinine, Ser: 0.62 mg/dL (ref 0.50–1.10)
GFR calc Af Amer: >90 mL/min (ref >90)
GLUCOSE: 108 mg/dL — AB (ref 70–99)
Potassium: 3.7 mmol/L (ref 3.5–5.1)
Sodium: 143 mmol/L (ref 135–145)

## 2014-12-06 LAB — CBC
HEMATOCRIT: 42.5 % (ref 36.0–46.0)
Hemoglobin: 13.3 g/dL (ref 12.0–15.0)
MCH: 30.4 pg (ref 26.0–34.0)
MCHC: 31.3 g/dL (ref 30.0–36.0)
MCV: 97.3 fL (ref 78.0–100.0)
PLATELETS: 246 10*3/uL (ref 150–400)
RBC: 4.37 MIL/uL (ref 3.87–5.11)
RDW: 14.4 % (ref 11.5–15.5)
WBC: 10.8 10*3/uL — ABNORMAL HIGH (ref 4.0–10.5)

## 2014-12-06 LAB — TSH: TSH: 6.446 u[IU]/mL — AB (ref 0.350–4.500)

## 2014-12-06 MED ORDER — LEVOTHYROXINE SODIUM 75 MCG PO TABS: 225.0000 ug | ORAL_TABLET | Freq: Every day | ORAL | Status: AC

## 2014-12-06 MED ORDER — TAPENTADOL HCL ER 100 MG PO TB12: 50.0000 mg | ORAL_TABLET | Freq: Two times a day (BID) | ORAL | Status: DC

## 2014-12-06 MED ORDER — LEVOTHYROXINE SODIUM 200 MCG PO TABS: 225.0000 ug | ORAL_TABLET | Freq: Every day | ORAL | Status: DC

## 2014-12-06 NOTE — Progress Notes (Signed)
  Subjective: Feels good, smiling and continues to improve.  Objective: Vital signs in last 24 hours: Temp:  [98.1 F (36.7 C)-98.7 F (37.1 C)] 98.7 F (37.1 C) (01/06 0520) Pulse Rate:  [86-92] 87 (01/06 0520) Resp:  [18-20] 20 (01/06 0520) BP: (112-118)/(55-64) 117/60 mmHg (01/06 0520) SpO2:  [91 %-100 %] 93 % (01/06 0520) Last BM Date: 12/05/14 840 PO   Full liquids No BM recorded yesterday.  Pt says she had BM yesterday and one this AM. Labs OK Intake/Output from previous day: 01/05 0701 - 01/06 0700 In: 840 [P.O.:840] Out: 1300 [Urine:800; Emesis/NG output:500] Intake/Output this shift:    General appearance: alert, cooperative and no distress GI: soft, non-tender; bowel sounds normal; no masses,  no organomegaly  Lab Results:   Recent Labs  2014-12-11 0902 12/06/14 0520  WBC 11.1* 10.8*  HGB 13.6 13.3  HCT 43.9 42.5  PLT 221 246    BMET  Recent Labs  12/05/14 0445 12/06/14 0520  NA 138 143  K 3.4* 3.7  CL 96 102  CO2 32 32  GLUCOSE 93 108*  BUN 13 9  CREATININE 0.56 0.62  CALCIUM 8.4 8.8   PT/INR No results for input(s): LABPROT, INR in the last 72 hours.   Recent Labs Lab 12/01/14 1809 12/02/14 0558  AST 38* 39*  ALT 27 32  ALKPHOS 52 51  BILITOT 0.6 0.5  PROT 9.0* 8.1  ALBUMIN 4.5 3.9     Lipase     Component Value Date/Time   LIPASE 58 12/01/2014 1809     Studies/Results: Dg Abd 2 Views  December 11, 2014   CLINICAL DATA:  Small bowel obstruction.  EXAM: ABDOMEN - 2 VIEW  COMPARISON:  12/03/2014  FINDINGS: Nasogastric tube is seen with tip in the distal stomach. Decreased small bowel dilatation is seen since previous study. No evidence of free air. Right upper quadrant surgical clips again noted.  IMPRESSION: Decreased small bowel dilatation since prior exam.   Electronically Signed   By: Earle Gell M.D.   On: 12/11/14 13:41    Medications: . antiseptic oral rinse  7 mL Mouth Rinse q12n4p  . chlorhexidine  15 mL Mouth Rinse BID   . enoxaparin (LOVENOX) injection  75 mg Subcutaneous Q24H  . fentaNYL  25 mcg Transdermal Q72H  . levothyroxine  100 mcg Intravenous QAC breakfast    Assessment/Plan SBO URI Benzodiazepine dependence Chronic pain/opiated dependence Asthma Hypothyroid Body mass index is 52.89 kg/(m^2).  COPD/Home O2/hx of tobacco use AODM Migraines Depression/anxiety/panic attacks by history   Plan:  Soft diet, continue to mobilize.  We will sign off, call if we can help or there is further issues.    LOS: 5 days    Denise Case 12/06/2014

## 2014-12-06 NOTE — Discharge Summary (Signed)
[facility] PROGRESS NOTE  DEARRA MYHAND LPF:[id number] DOB: [birth year]    PCP:   Tamsen Roers, MD   HPI: Denise Case is an 59 y.o. female admitted for ileus vs SBO by Dr Jonn Shingles on 12/02/13. As per his H and P:  " HPI: RENATTA Case is a 59 y.o. female with history of hypothyroidism, chronic pain and fibromyalgia and asthma presents to the ER because of abdominal pain. Patient has been having abdominal pain since morning which was mostly in the periumbilical and epigastric area with nausea vomiting. Patient's last bowel movement was 48 hours ago. Patient has not moved any flatus. In the ER patient had a CT abdomen and pelvis which shows features consistent with small bowel obstruction. On-call surgeon Dr. Harlow Asa has been consulted. Patient will be admitted for further management. Patient has had previous history of hysterectomy and cholecystectomy. Denies any chest pain or shortness of breath.   HOSPITAL COURSE:  It was felt that part of the reason she had ileus vs SBO was due to her high dose of benzodiapines and narcotics.  These were adjusted aggressively, and she did not have any withdrawal nor complaining of pain.  She was given a duragesic patch, but unsure if she will be covered to obtain this. In the interim, surgery was consulted, and manage her diet.  She was started on clear liquid and advanced, and she did well.  Her K was low, and it was supplemented.  Her TSH was low, and her synthroid at 225mcg will be increased to 29mcg per day since she said she has been compliance.  Going forward, I will restart her at only 50mg  TID of Nucynta, no benzo, and consider Duragesic patch.  Surgery signed off since she has been able to eat.  She is anxious to go home, and is stable for discharge.  She has been very agreeable to reduce her narcotic and Benzo use. IIn that same spirit of medication reduction, since theophyllin has high therapeutic index, it will be discontinued as well.      MEDICATIONS AT DISCHARGE:    Medication Sig Start Date End Date Taking? Authorizing Provider         amitriptyline (ELAVIL) 25 MG tablet Take 25 mg by mouth at bedtime.   Yes Historical Provider, MD  CALCIUM-VITAMIN D PO Take 1 tablet by mouth daily.   Yes Historical Provider, MD  Cinnamon 500 MG capsule Take 500 mg by mouth 2 (two) times daily.   Yes Historical Provider, MD  cyanocobalamin 1000 MCG tablet Take 1,000 mcg by mouth daily.    Yes Historical Provider, MD         dexlansoprazole (DEXILANT) 60 MG capsule Take 60 mg by mouth daily.   Yes Historical Provider, MD  fish oil-omega-3 fatty acids 1000 MG capsule Take 1 g by mouth 3 (three) times daily with meals.    Yes Historical Provider, MD  FLUoxetine (PROZAC) 20 MG capsule Take 20 mg by mouth daily.   Yes Historical Provider, MD         gabapentin (NEURONTIN) 800 MG tablet Take 400-800 mg by mouth 3 (three) times daily. 0.5 tablet (400 mg) in the morning and then 0.5 tablet (400 mg) 8 hours later and 1 tablet (800mg ) at bedtime.   Yes Historical Provider, MD  Menthol, Topical Analgesic, (MINERAL ICE) 2 % GEL Apply 1 application topically as needed (back and feet pain).   Yes Historical Provider, MD  mineral oil liquid Take 15-30  mLs by mouth daily as needed for mild constipation (constipation). Constipation   Yes Historical Provider, MD                vitamin C (ASCORBIC ACID) 500 MG tablet Take 500 mg by mouth at bedtime.    Yes Historical Provider, MD  ergocalciferol (VITAMIN D2) 50000 UNITS capsule Take 50,000 Units by mouth once a week. Friday    Historical Provider, MD  levothyroxine (SYNTHROID) 225 MCG tablet Take 1 tablet (225 mcg total) by mouth daily before breakfast. 12/06/14   Orvan Falconer, MD  ondansetron (ZOFRAN) 4 MG tablet Take 1 tablet (4 mg total) by mouth every 4 (four) hours as needed for nausea or vomiting. 05/04/14   Jackolyn Confer, MD  SUMAtriptan (IMITREX) 100 MG tablet Take 100 mg by mouth every 2 (two) hours as  needed for migraine or headache. May repeat in 2 hours if headache persists or recurs.    Historical Provider, MD  Tapentadol HCl (NUCYNTA ER) 100 MG TB12 Take 50 mg by mouth 2 (two) times daily before a meal. 12/06/14   Orvan Falconer, MD  traMADol (ULTRAM) 50 MG tablet Take 1-2 tablets (50-100 mg total) by mouth every 6 (six) hours as needed. 05/26/14   Jackolyn Confer, MD     DISCHARGE DIAGNOSIS:  . SBO (small bowel obstruction) . Hypothyroidism . Chronic pain syndrome . Fatty liver . Urinary retention . Opiate dependence . Benzodiazepine dependence . Acute upper respiratory infection  Code Status: FULL Haskel Khan, MD. [facility] Pager [phone] 7pm to 7am.  12/06/2014, 11:10 AM

## 2015-03-29 ENCOUNTER — Encounter (HOSPITAL_COMMUNITY): Payer: Self-pay | Admitting: *Deleted

## 2015-04-05 ENCOUNTER — Ambulatory Visit (HOSPITAL_COMMUNITY)
Admission: RE | Admit: 2015-04-05 | Discharge: 2015-04-05 | Disposition: A | Discharge status: Home / Self Care | Payer: 59 | Source: Ambulatory Visit | Attending: Gastroenterology | Admitting: Gastroenterology

## 2015-04-05 ENCOUNTER — Encounter (HOSPITAL_COMMUNITY)
Admission: RE | Disposition: A | Discharge status: Home / Self Care | Payer: Self-pay | Source: Ambulatory Visit | Attending: Gastroenterology

## 2015-04-05 ENCOUNTER — Ambulatory Visit (HOSPITAL_COMMUNITY): Payer: 59 | Admitting: Anesthesiology

## 2015-04-05 ENCOUNTER — Encounter (HOSPITAL_COMMUNITY): Payer: Self-pay

## 2015-04-05 DIAGNOSIS — E119 Type 2 diabetes mellitus without complications: Secondary | ICD-10-CM | POA: Insufficient documentation

## 2015-04-05 DIAGNOSIS — E039 Hypothyroidism, unspecified: Secondary | ICD-10-CM | POA: Insufficient documentation

## 2015-04-05 DIAGNOSIS — F17210 Nicotine dependence, cigarettes, uncomplicated: Secondary | ICD-10-CM | POA: Insufficient documentation

## 2015-04-05 DIAGNOSIS — D123 Benign neoplasm of transverse colon: Secondary | ICD-10-CM | POA: Diagnosis not present

## 2015-04-05 DIAGNOSIS — G43909 Migraine, unspecified, not intractable, without status migrainosus: Secondary | ICD-10-CM | POA: Diagnosis not present

## 2015-04-05 DIAGNOSIS — J449 Chronic obstructive pulmonary disease, unspecified: Secondary | ICD-10-CM | POA: Insufficient documentation

## 2015-04-05 DIAGNOSIS — D125 Benign neoplasm of sigmoid colon: Secondary | ICD-10-CM | POA: Diagnosis not present

## 2015-04-05 DIAGNOSIS — Z6841 Body Mass Index (BMI) 40.0 and over, adult: Secondary | ICD-10-CM | POA: Insufficient documentation

## 2015-04-05 DIAGNOSIS — F419 Anxiety disorder, unspecified: Secondary | ICD-10-CM | POA: Diagnosis not present

## 2015-04-05 DIAGNOSIS — Z86010 Personal history of colonic polyps: Secondary | ICD-10-CM | POA: Insufficient documentation

## 2015-04-05 DIAGNOSIS — Z9049 Acquired absence of other specified parts of digestive tract: Secondary | ICD-10-CM | POA: Diagnosis not present

## 2015-04-05 DIAGNOSIS — J45909 Unspecified asthma, uncomplicated: Secondary | ICD-10-CM | POA: Insufficient documentation

## 2015-04-05 DIAGNOSIS — F329 Major depressive disorder, single episode, unspecified: Secondary | ICD-10-CM | POA: Diagnosis not present

## 2015-04-05 DIAGNOSIS — Z1211 Encounter for screening for malignant neoplasm of colon: Secondary | ICD-10-CM | POA: Insufficient documentation

## 2015-04-05 DIAGNOSIS — G5792 Unspecified mononeuropathy of left lower limb: Secondary | ICD-10-CM | POA: Diagnosis not present

## 2015-04-05 DIAGNOSIS — M797 Fibromyalgia: Secondary | ICD-10-CM | POA: Insufficient documentation

## 2015-04-05 DIAGNOSIS — K648 Other hemorrhoids: Secondary | ICD-10-CM | POA: Insufficient documentation

## 2015-04-05 DIAGNOSIS — G5791 Unspecified mononeuropathy of right lower limb: Secondary | ICD-10-CM | POA: Diagnosis not present

## 2015-04-05 DIAGNOSIS — K219 Gastro-esophageal reflux disease without esophagitis: Secondary | ICD-10-CM | POA: Diagnosis not present

## 2015-04-05 HISTORY — PX: COLONOSCOPY WITH PROPOFOL: SHX5780

## 2015-04-05 LAB — GLUCOSE, CAPILLARY: Glucose-Capillary: 97 mg/dL (ref 70–99)

## 2015-04-05 SURGERY — COLONOSCOPY WITH PROPOFOL
Anesthesia: Monitor Anesthesia Care

## 2015-04-05 MED ORDER — PROPOFOL 10 MG/ML IV BOLUS
INTRAVENOUS | Status: AC
  Filled 2015-04-05: qty 20

## 2015-04-05 MED ORDER — FENTANYL CITRATE (PF) 100 MCG/2ML IJ SOLN: 25.0000 ug | INTRAMUSCULAR | Status: DC | PRN

## 2015-04-05 MED ORDER — LIDOCAINE HCL (CARDIAC) 20 MG/ML IV SOLN
INTRAVENOUS | Status: DC | PRN
  Administered 2015-04-05: 100 mg via INTRAVENOUS

## 2015-04-05 MED ORDER — PROPOFOL INFUSION 10 MG/ML OPTIME
INTRAVENOUS | Status: DC | PRN
  Administered 2015-04-05: 160 ug/kg/min via INTRAVENOUS

## 2015-04-05 MED ORDER — PROMETHAZINE HCL 25 MG/ML IJ SOLN: 6.2500 mg | INTRAMUSCULAR | Status: DC | PRN

## 2015-04-05 MED ORDER — SODIUM CHLORIDE 0.9 % IV SOLN: INTRAVENOUS | Status: DC

## 2015-04-05 MED ORDER — LIDOCAINE HCL (CARDIAC) 20 MG/ML IV SOLN
INTRAVENOUS | Status: AC
  Filled 2015-04-05: qty 5

## 2015-04-05 MED ORDER — PROPOFOL 10 MG/ML IV BOLUS
INTRAVENOUS | Status: DC | PRN
  Administered 2015-04-05 (×2): 20 mg via INTRAVENOUS
  Administered 2015-04-05: 30 mg via INTRAVENOUS
  Administered 2015-04-05 (×2): 20 mg via INTRAVENOUS

## 2015-04-05 MED ORDER — LACTATED RINGERS IV SOLN
INTRAVENOUS | Status: DC
  Administered 2015-04-05: 1000 mL via INTRAVENOUS

## 2015-04-05 SURGICAL SUPPLY — 22 items

## 2015-04-05 NOTE — Transfer of Care (Signed)
Immediate Anesthesia Transfer of Care Note  Patient: Denise Case  Procedure(s) Performed: Procedure(s): COLONOSCOPY WITH PROPOFOL (N/A)  Patient Location: PACU and Endoscopy Unit  Anesthesia Type:MAC  Level of Consciousness: awake and alert   Airway & Oxygen Therapy: Patient Spontanous Breathing and Patient connected to face mask oxygen  Post-op Assessment: Report given to RN and Post -op Vital signs reviewed and stable  Post vital signs: Reviewed and stable  Last Vitals:  Filed Vitals:   04/05/15 0653  BP: 156/63  Pulse: 95  Temp: 36.9 C  Resp: 15    Complications: No apparent anesthesia complications

## 2015-04-05 NOTE — Op Note (Signed)
[facility] [place], [id number]   OPERATIVE PROCEDURE REPORT  PATIENT: Denise Case, Denise Case  MR#: [id number] BIRTHDATE: [birth year] GENDER: female ENDOSCOPIST: Edmonia James, MD ASSISTANT:   Laverta Baltimore  RN & Cristopher Estimable technician. PROCEDURE DATE: 2015/05/01 PRE-PROCEDURE PREPARATION: Patient fasted for 4 hours prior to procedure. The patient was prepped with a gallon of Golytely the night prior to the procedure.  The patient has fasted for 4 hours prior to the procedure PRE-PROCEDURE PHYSICAL: Patient has stable vital signs.  Neck is supple.  There is no JVD, thyromegaly or LAD.  Chest clear to auscultation.  S1 and S2 regular.  Abdomen soft, morbidly obese, non-distended, non-tender with NABS. PROCEDURE:     Colonoscopy with hot snare polypectomy x 3. ASA CLASS:     Class IV INDICATIONS:     1. Colorectal cancer screening 2. Personal history of colonic polyps. Marland Kitchen MEDICATIONS:     Monitored anesthesia care  DESCRIPTION OF PROCEDURE: After the risks, benefits, and alternatives of the procedure were thoroughly explained [including a 10% missed rate of cancer and polyps], informed consent was obtained.  Digital rectal exam was performed. The Pentax Ped Colon [id number]  was introduced through the anus  and advanced to the cecum, which was identified by both the appendix and ileocecal valve , limited by No adverse events experienced.  The quality of the prep was good. . Multiple washes were done. Small lesions could be missed. The instrument was then slowly withdrawn as the colon was fully examined. Estimated blood loss is zero unless otherwise noted in this procedure report.     COLON FINDINGS: 2 sessile polyps were found in the sigmoid colon; 4 mm and 6 mm in size and were removed by hot snare polypectomy x 2-200./20. The resection was complete and the polyp tissue was completely retrieved and sent to histology. There was another 8  mm sessile polyup in the proximal transverse colon that was removed by a hot snare x 1-200/20. The rset of the entire colonic mucosa appeared healthy with a normal vascular pattern. No masses, diverticula or AVMs were noted.  The appendiceal orifice and the ICV were identified and photographed. Retroflexed views revealed small internal hemorrhoids. The patient tolerated the procedure without immediate complications.  The scope was then withdrawn from the patient and the procedure terminated.  TIME TO CECUM:   7 minutes 00 seconds WITHDRAW TIME:  6 minutes 00 seconds  IMPRESSION:     1.  2 sessile polyp(s) were found in the sigmoid colon-removed by hot snare polypectomy. 2.  One 8 mm sessile tansverse colon polyp-removed by hot snare polypectomy 3.  Small internal hemorrhoids.  RECOMMENDATIONS:     1.  Await pathology results. 2.  Hold Aspirin and all other NSAIDS for 2 weeks. 3.  Continue current medications. 4.  High fiber diet with liberal fluid intake. 5.  OP follow-up is advised on a PRN basis.  REPEAT EXAM:      In 5 years  for a repeat colonoscopy.  If the patient has any abnormal GI symptoms in the interim, she have been advised to contact the office as soon as possible for further recommendations.   REFERRED EP:PIRJJ Little, M.D.  eSigned:  Edmonia James, MD 2015-05-01 8:31 AM   CPT CODES:     [phone] Colonoscopy, flexible, proximal to splenic flexure; with removal of tumor(s), polyp(s), or other lesion(s) by snare technique ICD CODES:     Z12.11 Encounter for screening  for malignant neoplasm of colon, Z86.010 personal history of colonic polyps D12.5 Benign neoplasm of sigmoid colon  The ICD and CPT codes recommended by this software are interpretations from the data that the clinical staff has captured with the software.  The verification of the translation of this report to the ICD and CPT codes and modifiers is the sole responsibility of the health care  institution and practicing physician where this report was generated.  [employer] will not be held responsible for the validity of the ICD and CPT codes included on this report.  AMA assumes no liability for data contained or not contained herein. CPT is a Designer, television/film set of the [employer].  PATIENT NAME:  Denise Case, Denise Case MR#: [id number]

## 2015-04-05 NOTE — H&P (Signed)
Denise Case is an 59 y.o. female.   Chief Complaint: Colorectal cancer screening.  HPI: Patient is here for a colonoscopy. See office notes for details.  Past Medical History  Diagnosis Date  . COPD (chronic obstructive pulmonary disease)   . Fibromyalgia   . Thyroid disease   . Morbid obesity   . GERD (gastroesophageal reflux disease)   . Hyperlipidemia   . Asthma   . Diverticulitis   . Lumbar sprain   . Diabetes mellitus without complication   . Pneumonia 2015  . Hypothyroidism   . Migraine   . Depression   . Anxiety   . History of kidney stones   . Constipation due to pain medication   . Requires supplemental oxygen     PRN uses 1.5 Liters  . Neuropathy     feet and legs  . Tingling sensation     hands  . Dermatitis   . History of panic attacks   . SBO (small bowel obstruction) 12/01/2014  . Fatty liver 12/02/2014  . Opiate dependence 12/03/2014   Past Surgical History  Procedure Laterality Date  . Bladder tack      X 2  . Abdominal hysterectomy    . Cholecystectomy N/A 05/25/2014    Procedure: LAPAROSCOPIC CHOLECYSTECTOMY WITH INTRAOPERATIVE CHOLANGIOGRAM;  Surgeon: Odis Hollingshead, MD;  Location: [facility];  Service: General;  Laterality: N/A;   History reviewed. No pertinent family history. Social History:  reports that she has been smoking Cigarettes.  She has a 18.5 pack-year smoking history. She has never used smokeless tobacco. She reports that she does not drink alcohol or use illicit drugs.  Allergies:  Allergies  Allergen Reactions  . Montelukast Sodium Anaphylaxis and Swelling  . Doxycycline Other (See Comments)    Strips lining off of top of tongue, like thrush  . Hydrocodone-Acetaminophen Other (See Comments)    Migraines (high doses)   Medications Prior to Admission  Medication Sig Dispense Refill  . ALPRAZolam (XANAX) 1 MG tablet Take 1 mg by mouth 3 (three) times daily as needed for anxiety.    Marland Kitchen amitriptyline (ELAVIL) 25 MG tablet Take 25 mg  by mouth at bedtime.    Marland Kitchen CALCIUM-VITAMIN D PO Take 1 tablet by mouth every morning.     . cyanocobalamin 1000 MCG tablet Take 1,000 mcg by mouth every morning.     Marland Kitchen dexlansoprazole (DEXILANT) 60 MG capsule Take 60 mg by mouth every morning.     . Dulaglutide (TRULICITY ) Inject 1 each into the skin once a week. Sunday morning.    . ergocalciferol (VITAMIN D2) 50000 UNITS capsule Take 50,000 Units by mouth once a week. Friday    . FIBER SELECT GUMMIES PO Take 3 each by mouth 2 (two) times daily.    . fish oil-omega-3 fatty acids 1000 MG capsule Take 1 g by mouth 3 (three) times daily with meals.     Marland Kitchen FLUoxetine (PROZAC) 20 MG capsule Take 20 mg by mouth every morning.     . gabapentin (NEURONTIN) 800 MG tablet Take 400-800 mg by mouth 3 (three) times daily. 0.5 tablet (400 mg) in the morning and then 0.5 tablet (400 mg) 8 hours later and 1 tablet (800mg ) at bedtime.    Marland Kitchen levothyroxine (SYNTHROID, LEVOTHROID) 75 MCG tablet Take 3 tablets (225 mcg total) by mouth daily before breakfast. 90 tablet 1  . metFORMIN (GLUCOPHAGE) 500 MG tablet Take 500 mg by mouth daily with breakfast.    .  mineral oil liquid Take 15-30 mLs by mouth daily as needed for mild constipation (constipation). Constipation    . Multiple Vitamins-Minerals (MULTIVITAMIN GUMMIES ADULT PO) Take 3 each by mouth 2 (two) times daily.    . ondansetron (ZOFRAN) 4 MG tablet Take 1 tablet (4 mg total) by mouth every 4 (four) hours as needed for nausea or vomiting. 30 tablet 0  . OVER THE COUNTER MEDICATION Take 2 each by mouth at bedtime. Dannon Activity Yogurt.    . pseudoephedrine-acetaminophen (TYLENOL SINUS) 30-500 MG TABS Take 2 tablets by mouth 2 (two) times daily.    . SUMAtriptan (IMITREX) 100 MG tablet Take 100 mg by mouth every 2 (two) hours as needed for migraine or headache. May repeat in 2 hours if headache persists or recurs.    . Tapentadol HCl (NUCYNTA ER) 100 MG TB12 Take 50 mg by mouth 2 (two) times daily before a meal.  60 tablet 0  . vitamin C (ASCORBIC ACID) 500 MG tablet Take 500 mg by mouth at bedtime.     . traMADol (ULTRAM) 50 MG tablet Take 1-2 tablets (50-100 mg total) by mouth every 6 (six) hours as needed. (Patient not taking: Reported on 03/16/2015) 30 tablet 0   Results for orders placed or performed during the hospital encounter of 04/05/15 (from the past 48 hour(s))  Glucose, capillary     Status: None   Collection Time: 04/05/15  7:25 AM  Result Value Ref Range   Glucose-Capillary 97 70 - 99 mg/dL   Review of Systems  Constitutional: Negative.   HENT: Negative.   Eyes: Negative.   Respiratory: Negative.   Cardiovascular: Negative.   Gastrointestinal: Positive for heartburn. Negative for vomiting, abdominal pain, diarrhea, constipation, blood in stool and melena.  Genitourinary: Negative.   Musculoskeletal: Positive for back pain and joint pain.  Skin: Negative.   Neurological: Negative.   Psychiatric/Behavioral: The patient is nervous/anxious and has insomnia.    Blood pressure 156/63, pulse 95, temperature 98.5 F (36.9 C), temperature source Oral, resp. rate 15, height 5\' 6"  (1.676 m), weight 147.419 kg (325 lb), SpO2 91 %. Physical Exam  Constitutional: She is oriented to person, place, and time. She appears well-developed and well-nourished.  Morbidly obese  HENT:  Head: Normocephalic and atraumatic.  Eyes: Conjunctivae and EOM are normal. Pupils are equal, round, and reactive to light.  Neck: Normal range of motion. Neck supple.  GI: Soft. Bowel sounds are normal.  Neurological: She is alert and oriented to person, place, and time.  Skin: Skin is warm and dry.  Psychiatric: She has a normal mood and affect. Her behavior is normal. Thought content normal.   Assessment/Plan Colorectal cancer screening: proceed with a colonoscopy at this time.  Denise Case 04/05/2015, 7:37 AM

## 2015-04-05 NOTE — Anesthesia Postprocedure Evaluation (Signed)
  Anesthesia Post-op Note  Patient: Denise Case  Procedure(s) Performed: Procedure(s): COLONOSCOPY WITH PROPOFOL (N/A)  Patient Location: PACU  Anesthesia Type:MAC  Level of Consciousness: awake  Airway and Oxygen Therapy: Patient Spontanous Breathing  Post-op Pain: mild  Post-op Assessment: Post-op Vital signs reviewed  Post-op Vital Signs: Reviewed  Last Vitals:  Filed Vitals:   04/05/15 0653  BP: 156/63  Pulse: 95  Temp: 36.9 C  Resp: 15    Complications: No apparent anesthesia complications

## 2015-04-05 NOTE — Anesthesia Preprocedure Evaluation (Addendum)
Anesthesia Evaluation  Patient identified by MRN, date of birth, ID band Patient awake    Airway Mallampati: I  TM Distance: >3 FB Neck ROM: Full    Dental   Pulmonary asthma , neg pneumonia -, COPDCurrent Smoker,  breath sounds clear to auscultation        Cardiovascular negative cardio ROS  Rhythm:Regular Rate:Normal     Neuro/Psych    GI/Hepatic Neg liver ROS, GERD-  ,  Endo/Other  diabetesHypothyroidism   Renal/GU      Musculoskeletal   Abdominal   Peds  Hematology   Anesthesia Other Findings   Reproductive/Obstetrics                           Anesthesia Physical Anesthesia Plan  ASA: III  Anesthesia Plan: MAC   Post-op Pain Management:    Induction: Intravenous  Airway Management Planned: Simple Face Mask  Additional Equipment:   Intra-op Plan:   Post-operative Plan:   Informed Consent:   Dental advisory given  Plan Discussed with: CRNA and Anesthesiologist  Anesthesia Plan Comments:         Anesthesia Quick Evaluation

## 2015-04-06 ENCOUNTER — Encounter (HOSPITAL_COMMUNITY): Payer: Self-pay | Admitting: Gastroenterology

## 2015-07-11 ENCOUNTER — Encounter: Payer: Self-pay | Admitting: Dietician

## 2015-07-11 ENCOUNTER — Encounter: Payer: 59 | Attending: Family Medicine | Admitting: Dietician

## 2015-07-11 DIAGNOSIS — Z6841 Body Mass Index (BMI) 40.0 and over, adult: Secondary | ICD-10-CM | POA: Diagnosis not present

## 2015-07-11 DIAGNOSIS — Z713 Dietary counseling and surveillance: Secondary | ICD-10-CM | POA: Diagnosis not present

## 2015-07-11 NOTE — Progress Notes (Signed)
Medical Nutrition Therapy:  Appt start time: 1015 end time:  1110.   Assessment:  Primary concerns today: Denise Case is here today since she is trying to lose weight. Has previously met with Jenita Seashore, RD CDE in 2013 and 2014. Was recently diagnosed with diabetes  and may want to talk about that at a future appointment. Hgb A1c is 6.8%. Not testing blood sugar. Taking Metformin and Trulicity. Lost 9 lbs in the past month. Has been doing exercise in the pool, eat yogurt, and skipping meals in order to lose weight. Has lost weight before by having yogurt 3 meals per day. Some foods make her stomach hurt after having her gallbladder removed (last year) and yogurt and chicken soup don't hurt her stomach.   Feels like food "sticks" in her stomach. Has dentures but they make her gag so doesn't use them. Doctor has advised her to eat what she can handle. Had been tapering down on Nucynta for awhile and would like to come off of it since it requires expensive testing each month. Has neuropathy in her feet and cannot drive. Can't easily walk. Exercise bike hurts to use it. Doesn't want to go to an indoor pool around other people.   Lives with husband and they do the shopping together. Husband does cooking. Skips meals about 2-3 x week. Goes to out to eat 2 x month.  Sleeps a lot during the day. Has constipation sometimes and will have Fiber One sometimes. Does not feel hungry throughout the day but has nausea is she doesn't eat with medication.  Weight loss goal is 240 lbs.   Preferred Learning Style:   No preference indicated   Learning Readiness:   Ready  MEDICATIONS: see list   DIETARY INTAKE:  Usual eating pattern includes 2-3 meals and 1-2 snacks per day.  Avoided foods include: peas, lobster, spicy foods, red tomatoes/sauce   24-hr recall:  B ( AM): Dannon Light and Fit with a boiled egg with flavored water  Snk ( AM): none L ( PM): Dannon Light and Fit or tomatoes Snk ( PM):  individual sized pie or cherries or peanut butter crackers with grand kids D ( PM): Dannon Light with SF popsicle and Fit or vegetables soup or steamable vegetables  or out to dinner 2 x month or fiber one cereal  Snk ( PM): 1/2 small dark chocolate bar (recommended by doctor) Beverages: flavored water or 2% milk in cereal and having 1 cup of green tea with ginger at night sometimes  Usual physical activity: goes to the pool most days if it doesn't rain with grandkids and walks at [facility] one time per week  Estimated energy needs: 1600 calories 180 g carbohydrates 120 g protein 44 g fat  Progress Towards Goal(s):  In progress.   Nutritional Diagnosis:  -3.3 Overweight/obesity As related to hx of meal skipping and energy dense food choices.  As evidenced by 51.8.    Intervention:  Nutrition counseling provided. Plan: Continue having yogurt and boiled egg for breakfast. Fill half of your plate with vegetables at lunch and dinner. Have chicken/turkey the size of the palm of your hand. Have starch/fruit on quarter of your plate. If you are hungry between meals, have protein (peanut butter, cheese, lunch meat, cottage cheese) with carbohydrates (crackers, fruit, 10 chips). Save pies for special occasions, not 3 x week.  Once weather is cooler, walk with husband 3 x week in morning.  Teaching Method Utilized:  Visual Auditory Hands on  Handouts given during visit include:  Meal Card  MyPlate Handout  15 g CHO Snacks  Barriers to learning/adherence to lifestyle change: multiple health conditions, pain, limited mobility, no teeth, some foods hurt her stomach  Demonstrated degree of understanding via:  Teach Back   Monitoring/Evaluation:  Dietary intake, exercise, and body weight prn.

## 2015-07-11 NOTE — Patient Instructions (Addendum)
Continue having yogurt and boiled egg for breakfast. Fill half of your plate with vegetables at lunch and dinner. Have chicken/turkey the size of the palm of your hand. Have starch/fruit on quarter of your plate. If you are hungry between meals, have protein (peanut butter, cheese, lunch meat, cottage cheese) with carbohydrates (crackers, fruit, 10 chips). Save pies for special occasions, not 3 x week.  Once weather is cooler, walk with husband 3 x week in morning.

## 2016-03-07 ENCOUNTER — Emergency Department
Admission: EM | Admit: 2016-03-07 | Discharge: 2016-03-07 | Disposition: A | Discharge status: Home / Self Care | Payer: 59 | Attending: Emergency Medicine | Admitting: Emergency Medicine

## 2016-03-07 ENCOUNTER — Emergency Department: Payer: 59

## 2016-03-07 DIAGNOSIS — F17210 Nicotine dependence, cigarettes, uncomplicated: Secondary | ICD-10-CM | POA: Diagnosis not present

## 2016-03-07 DIAGNOSIS — S91111A Laceration without foreign body of right great toe without damage to nail, initial encounter: Secondary | ICD-10-CM

## 2016-03-07 DIAGNOSIS — Y929 Unspecified place or not applicable: Secondary | ICD-10-CM | POA: Insufficient documentation

## 2016-03-07 DIAGNOSIS — E119 Type 2 diabetes mellitus without complications: Secondary | ICD-10-CM | POA: Diagnosis not present

## 2016-03-07 DIAGNOSIS — S92911B Unspecified fracture of right toe(s), initial encounter for open fracture: Secondary | ICD-10-CM

## 2016-03-07 DIAGNOSIS — S92421A Displaced fracture of distal phalanx of right great toe, initial encounter for closed fracture: Secondary | ICD-10-CM | POA: Diagnosis not present

## 2016-03-07 DIAGNOSIS — Y999 Unspecified external cause status: Secondary | ICD-10-CM | POA: Insufficient documentation

## 2016-03-07 DIAGNOSIS — Y9301 Activity, walking, marching and hiking: Secondary | ICD-10-CM | POA: Insufficient documentation

## 2016-03-07 DIAGNOSIS — W1849XA Other slipping, tripping and stumbling without falling, initial encounter: Secondary | ICD-10-CM | POA: Insufficient documentation

## 2016-03-07 MED ORDER — OXYCODONE-ACETAMINOPHEN 5-325 MG PO TABS: 1.0000 | ORAL_TABLET | Freq: Four times a day (QID) | ORAL | Status: DC | PRN

## 2016-03-07 MED ORDER — OXYCODONE-ACETAMINOPHEN 5-325 MG PO TABS
1.0000 | ORAL_TABLET | Freq: Once | ORAL | Status: AC
  Administered 2016-03-07: 1 via ORAL
  Filled 2016-03-07: qty 1

## 2016-03-07 MED ORDER — CEPHALEXIN 500 MG PO CAPS: 500.0000 mg | ORAL_CAPSULE | Freq: Three times a day (TID) | ORAL | Status: DC

## 2016-03-07 MED ORDER — CEPHALEXIN 500 MG PO CAPS
500.0000 mg | ORAL_CAPSULE | Freq: Once | ORAL | Status: AC
  Administered 2016-03-07: 500 mg via ORAL
  Filled 2016-03-07: qty 1

## 2016-03-07 MED ORDER — HYDROMORPHONE HCL 1 MG/ML IJ SOLN
1.0000 mg | Freq: Once | INTRAMUSCULAR | Status: AC
  Administered 2016-03-07: 1 mg via INTRAMUSCULAR
  Filled 2016-03-07: qty 1

## 2016-03-07 MED ORDER — LIDOCAINE HCL (PF) 1 % IJ SOLN
INTRAMUSCULAR | Status: AC
  Filled 2016-03-07: qty 5

## 2016-03-07 MED ORDER — TETANUS-DIPHTH-ACELL PERTUSSIS 5-2.5-18.5 LF-MCG/0.5 IM SUSP
0.5000 mL | Freq: Once | INTRAMUSCULAR | Status: AC
  Administered 2016-03-07: 0.5 mL via INTRAMUSCULAR
  Filled 2016-03-07: qty 0.5

## 2016-03-07 NOTE — ED Notes (Signed)
Pt reports she tripped outside with sandals on  and her right great toe went under a flagstone paver. Pt has lac across the top of her toe at the base of the toe nail. Bleeding controlled. Pt is diabetic and has neuropathy.

## 2016-03-07 NOTE — ED Notes (Signed)
Pt discharged to home.  Family member driving.  Discharge instructions reviewed.  Verbalized understanding.  No questions or concerns at this time.  Teach back verified.  Pt in NAD.  No items left in ED.   

## 2016-03-07 NOTE — Discharge Instructions (Signed)
Please keep your splint in place, call orthopedics at the number provided, arrange follow-up appointment in approximately one week for reevaluation. Return to the emergency department for any increased pain, signs of infection such as fever or redness. Please take your antibiotic as prescribed a pain medication as needed as written.    Laceration Care, Adult A laceration is a cut that goes through all layers of the skin. The cut also goes into the tissue that is right under the skin. Some cuts heal on their own. Others need to be closed with stitches (sutures), staples, skin adhesive strips, or wound glue. Taking care of your cut lowers your risk of infection and helps your cut to heal better. HOW TO TAKE CARE OF YOUR CUT For stitches or staples:  Keep the wound clean and dry.  If you were given a bandage (dressing), you should change it at least one time per day or as told by your doctor. You should also change it if it gets wet or dirty.  Keep the wound completely dry for the first 24 hours or as told by your doctor. After that time, you may take a shower or a bath. However, make sure that the wound is not soaked in water until after the stitches or staples have been removed.  Clean the wound one time each day or as told by your doctor:  Wash the wound with soap and water.  Rinse the wound with water until all of the soap comes off.  Pat the wound dry with a clean towel. Do not rub the wound.  After you clean the wound, put a thin layer of antibiotic ointment on it as told by your doctor. This ointment:  Helps to prevent infection.  Keeps the bandage from sticking to the wound.  Have your stitches or staples removed as told by your doctor. If your doctor used skin adhesive strips:   Keep the wound clean and dry.  If you were given a bandage, you should change it at least one time per day or as told by your doctor. You should also change it if it gets dirty or wet.  Do not get  the skin adhesive strips wet. You can take a shower or a bath, but be careful to keep the wound dry.  If the wound gets wet, pat it dry with a clean towel. Do not rub the wound.  Skin adhesive strips fall off on their own. You can trim the strips as the wound heals. Do not remove any strips that are still stuck to the wound. They will fall off after a while. If your doctor used wound glue:  Try to keep your wound dry, but you may briefly wet it in the shower or bath. Do not soak the wound in water, such as by swimming.  After you take a shower or a bath, gently pat the wound dry with a clean towel. Do not rub the wound.  Do not do any activities that will make you really sweaty until the skin glue has fallen off on its own.  Do not apply liquid, cream, or ointment medicine to your wound while the skin glue is still on.  If you were given a bandage, you should change it at least one time per day or as told by your doctor. You should also change it if it gets dirty or wet.  If a bandage is placed over the wound, do not let the tape for the bandage touch  the skin glue.  Do not pick at the glue. The skin glue usually stays on for 5-10 days. Then, it falls off of the skin. General Instructions  To help prevent scarring, make sure to cover your wound with sunscreen whenever you are outside after stitches are removed, after adhesive strips are removed, or when wound glue stays in place and the wound is healed. Make sure to wear a sunscreen of at least 30 SPF.  Take over-the-counter and prescription medicines only as told by your doctor.  If you were given antibiotic medicine or ointment, take or apply it as told by your doctor. Do not stop using the antibiotic even if your wound is getting better.  Do not scratch or pick at the wound.  Keep all follow-up visits as told by your doctor. This is important.  Check your wound every day for signs of infection. Watch for:  Redness, swelling, or  pain.  Fluid, blood, or pus.  Raise (elevate) the injured area above the level of your heart while you are sitting or lying down, if possible. GET HELP IF:  You got a tetanus shot and you have any of these problems at the injection site:  Swelling.  Very bad pain.  Redness.  Bleeding.  You have a fever.  A wound that was closed breaks open.  You notice a bad smell coming from your wound or your bandage.  You notice something coming out of the wound, such as wood or glass.  Medicine does not help your pain.  You have more redness, swelling, or pain at the site of your wound.  You have fluid, blood, or pus coming from your wound.  You notice a change in the color of your skin near your wound.  You need to change the bandage often because fluid, blood, or pus is coming from the wound.  You start to have a new rash.  You start to have numbness around the wound. GET HELP RIGHT AWAY IF:  You have very bad swelling around the wound.  Your pain suddenly gets worse and is very bad.  You notice painful lumps near the wound or on skin that is anywhere on your body.  You have a red streak going away from your wound.  The wound is on your hand or foot and you cannot move a finger or toe like you usually can.  The wound is on your hand or foot and you notice that your fingers or toes look pale or bluish.   This information is not intended to replace advice given to you by your health care provider. Make sure you discuss any questions you have with your health care provider.   Document Released: 05/05/2008 Document Revised: 04/03/2015 Document Reviewed: 11/13/2014 Elsevier Interactive Patient Education [employer].

## 2016-03-07 NOTE — ED Provider Notes (Signed)
[facility] Emergency Department Provider Note  Time seen: 8:57 PM  I have reviewed the triage vital signs and the nursing notes.   HISTORY  Chief Complaint Laceration and Toe Pain    HPI Denise Case is a 60 y.o. female with a past medical history of COPD, fibromyalgia, hyperlipidemia, diabetes who presents to the emergency department with a right great toe laceration. According to the patient she was walking when she tripped on a paver stone bending over the toe under her foot. Patient suffered a laceration over the top of the left great toe. States considerable pain in the toe 9/10 pain currently. Worse with any attempted movement of the toe.Denies any other injuries. Denies falling or hitting her head. Denies LOC.     Past Medical History  Diagnosis Date  . COPD (chronic obstructive pulmonary disease)   . Fibromyalgia   . Thyroid disease   . Morbid obesity   . GERD (gastroesophageal reflux disease)   . Hyperlipidemia   . Asthma   . Diverticulitis   . Lumbar sprain   . Diabetes mellitus without complication   . Pneumonia 2015  . Hypothyroidism   . Migraine   . Depression   . Anxiety   . History of kidney stones   . Constipation due to pain medication   . Requires supplemental oxygen     PRN uses 1.5 Liters  . Neuropathy     feet and legs  . Tingling sensation     hands  . Dermatitis   . History of panic attacks   . SBO (small bowel obstruction) 12/01/2014  . Fatty liver 12/02/2014  . Opiate dependence 12/03/2014    Patient Active Problem List   Diagnosis Date Noted  . Urinary retention 12/03/2014  . Opiate dependence ([place]) 12/03/2014  . Benzodiazepine dependence ([place]) 12/03/2014  . Acute upper respiratory infection 12/03/2014  . Hypothyroidism 12/02/2014  . Chronic pain syndrome 12/02/2014  . History of asthma 12/02/2014  . Fatty liver 12/02/2014  . SBO (small bowel obstruction) ([place]) 12/01/2014  . Chronic cholecystitis s/p lap  chole with [facility] 05/25/14 05/25/2014  . CAP (community acquired pneumonia) 05/04/2014  . RUQ abdominal pain 06/20/2013    Past Surgical History  Procedure Laterality Date  . Bladder tack      X 2  . Abdominal hysterectomy    . Cholecystectomy N/A 05/25/2014    Procedure: LAPAROSCOPIC CHOLECYSTECTOMY WITH INTRAOPERATIVE CHOLANGIOGRAM;  Surgeon: Odis Hollingshead, MD;  Location: [facility] ORS;  Service: General;  Laterality: N/A;  . Colonoscopy with propofol N/A 04/05/2015    Procedure: COLONOSCOPY WITH PROPOFOL;  Surgeon: Juanita Craver, MD;  Location: [facility] ENDOSCOPY;  Service: Endoscopy;  Laterality: N/A;    Current Outpatient Rx  Name  Route  Sig  Dispense  Refill  . ALPRAZolam (XANAX) 1 MG tablet   Oral   Take 1 mg by mouth 3 (three) times daily as needed for anxiety.         Marland Kitchen amitriptyline (ELAVIL) 25 MG tablet   Oral   Take 25 mg by mouth at bedtime.         . calcium carbonate (OS-CAL) 600 MG TABS tablet   Oral   Take 600 mg by mouth 2 (two) times daily with a meal.         . CALCIUM-VITAMIN D PO   Oral   Take 1 tablet by mouth every morning.          . Cinnamon 500  MG capsule   Oral   Take 500 mg by mouth daily.         . cyanocobalamin 1000 MCG tablet   Oral   Take 1,000 mcg by mouth every morning.          Marland Kitchen dexlansoprazole (DEXILANT) 60 MG capsule   Oral   Take 60 mg by mouth every morning.          . Dulaglutide (TRULICITY [place])   Subcutaneous   Inject 1 each into the skin once a week. Sunday morning.         . ergocalciferol (VITAMIN D2) 50000 UNITS capsule   Oral   Take 50,000 Units by mouth once a week. Friday         . FIBER SELECT GUMMIES PO   Oral   Take 3 each by mouth 2 (two) times daily.         . fish oil-omega-3 fatty acids 1000 MG capsule   Oral   Take 1 g by mouth 3 (three) times daily with meals.          Marland Kitchen FLUoxetine (PROZAC) 20 MG capsule   Oral   Take 20 mg by mouth every morning.          . flurazepam (DALMANE) 30 MG  capsule   Oral   Take 30 mg by mouth at bedtime as needed for sleep.         Marland Kitchen gabapentin (NEURONTIN) 800 MG tablet   Oral   Take 400-800 mg by mouth 3 (three) times daily. 0.5 tablet (400 mg) in the morning and then 0.5 tablet (400 mg) 8 hours later and 1 tablet (800mg ) at bedtime.         Marland Kitchen GARCINIA CAMBOGIA-CHROMIUM PO   Oral   Take by mouth.         . levothyroxine (SYNTHROID, LEVOTHROID) 75 MCG tablet   Oral   Take 3 tablets (225 mcg total) by mouth daily before breakfast.   90 tablet   1   . metFORMIN (GLUCOPHAGE) 500 MG tablet   Oral   Take 500 mg by mouth daily with breakfast.         . mineral oil liquid   Oral   Take 15-30 mLs by mouth daily as needed for mild constipation (constipation). Constipation         . Multiple Vitamins-Minerals (MULTIVITAMIN GUMMIES ADULT PO)   Oral   Take 3 each by mouth 2 (two) times daily.         . ondansetron (ZOFRAN) 4 MG tablet   Oral   Take 1 tablet (4 mg total) by mouth every 4 (four) hours as needed for nausea or vomiting.   30 tablet   0   . OVER THE COUNTER MEDICATION   Oral   Take 2 each by mouth at bedtime. Dannon Activity Yogurt.         . pseudoephedrine-acetaminophen (TYLENOL SINUS) 30-500 MG TABS   Oral   Take 2 tablets by mouth 2 (two) times daily.         . SUMAtriptan (IMITREX) 100 MG tablet   Oral   Take 100 mg by mouth every 2 (two) hours as needed for migraine or headache. May repeat in 2 hours if headache persists or recurs.         . Tapentadol HCl (NUCYNTA ER) 100 MG TB12   Oral   Take 50 mg by mouth 2 (two) times daily before a  meal.   60 tablet   0   . theophylline (THEO-24) 100 MG 24 hr capsule   Oral   Take 300 mg by mouth daily.         . traMADol (ULTRAM) 50 MG tablet   Oral   Take 1-2 tablets (50-100 mg total) by mouth every 6 (six) hours as needed. Patient not taking: Reported on 03/16/2015   30 tablet   0   . vitamin C (ASCORBIC ACID) 500 MG tablet   Oral    Take 500 mg by mouth at bedtime.            Allergies Montelukast sodium; Doxycycline; and Hydrocodone-acetaminophen  No family history on file.  Social History Social History  Substance Use Topics  . Smoking status: Current Every Day Smoker -- 0.50 packs/day for 37 years    Types: Cigarettes  . Smokeless tobacco: Never Used  . Alcohol Use: No    Review of Systems Constitutional: Negative for fever. Cardiovascular: Negative for chest pain. Respiratory: Negative for shortness of breath. Gastrointestinal: Negative for abdominal pain Genitourinary: Negative for dysuria. Musculoskeletal: Right great toe pain Neurological: Negative for headache 10-point ROS otherwise negative.  ____________________________________________   PHYSICAL EXAM:  VITAL SIGNS: ED Triage Vitals  Enc Vitals Group     BP 03/07/16 1944 143/80 mmHg     Pulse Rate 03/07/16 1944 92     Resp 03/07/16 1944 22     Temp 03/07/16 1944 98.1 F (36.7 C)     Temp Source 03/07/16 1944 Oral     SpO2 03/07/16 1944 92 %     Weight 03/07/16 1944 333 lb (151.048 kg)     Height 03/07/16 1944 5\' 6"  (1.676 m)     Head Cir --      Peak Flow --      Pain Score 03/07/16 1946 10     Pain Loc --      Pain Edu? --      Excl. in [place]? --    Constitutional: Alert and oriented. Well appearing and in no distress. Eyes: Normal exam ENT   Head: Normocephalic and atraumatic.   Mouth/Throat: Mucous membranes are moist. Cardiovascular: Normal rate, regular rhythm. No murmur Respiratory: Normal respiratory effort without tachypnea nor retractions. Breath sounds are clear  Gastrointestinal: Soft and nontender. No distention.  Obese. Musculoskeletal: Patient has a laceration on either side of the right great toe toenail. Toenail is in place, the laceration likely extends into the nail bed. Good cap refill distally. Considerable pain with any attempted range of motion. Foot otherwise appears normal. Neurologic:  Normal  speech and language. No gross focal neurologic deficits Skin:  Skin is warm, dry and intact.  Psychiatric: Mood and affect are normal.  ____________________________________________   RADIOLOGY  X-ray shows traumatic nondisplaced fracture of the base of the distal feelings of the right great toe  ____________________________________________   INITIAL IMPRESSION / ASSESSMENT AND PLAN / ED COURSE  Pertinent labs & imaging results that were available during my care of the patient were reviewed by me and considered in my medical decision making (see chart for details).  Patient presents with a laceration and broken toe. Patient does not know when her last TD Was. We will update her teeth In the emergency department. We'll dose pain medication in the emergency department. I'll perform a digital block, patient will require laceration repair. I will also place the patient in an aluminum splint due to her fracture. Is a patient  is diabetic we'll place the patient on Keflex and have her follow up with orthopedics. Patient will be discharged with Keflex and Percocet for pain control.  LACERATION REPAIR Performed by: Harvest Dark Authorized by: Harvest Dark Consent: Verbal consent obtained. Risks and benefits: risks, benefits and alternatives were discussed Consent given by: patient Patient identity confirmed: provided demographic data Prepped and Draped in normal sterile fashion Wound explored  Laceration Location: Right great toe  Laceration Length: 2 cm  No Foreign Bodies seen or palpated  Anesthesia: local infiltration  Local anesthetic: lidocaine 1 % without epinephrine digital block   Anesthetic total: 4 ml  Irrigation method: syringe Amount of cleaning: standard  Skin closure: 4-0 proline   Number of sutures: 4   Technique: Simple interrupted   Patient tolerance: Patient tolerated the procedure well with no immediate complications.   Laceration has been  repaired with 4 sutures. We'll cover and Xeroform, aluminum splint, and have the patient follow-up with orthopedics in 1 week. Patient is agreeable to plan. As the patient is diabetic we will cover with Keflex, and provide Percocet for pain relief.   ____________________________________________   FINAL CLINICAL IMPRESSION(S) / ED DIAGNOSES  Right great toe fracture. Right great toe laceration.   Harvest Dark, MD 03/07/16 2303

## 2016-05-29 ENCOUNTER — Encounter: Payer: Self-pay | Admitting: Emergency Medicine

## 2016-05-29 ENCOUNTER — Emergency Department: Payer: 59

## 2016-05-29 ENCOUNTER — Emergency Department
Admission: EM | Admit: 2016-05-29 | Discharge: 2016-05-29 | Disposition: A | Discharge status: Home / Self Care | Payer: 59 | Attending: Emergency Medicine | Admitting: Emergency Medicine

## 2016-05-29 DIAGNOSIS — J45909 Unspecified asthma, uncomplicated: Secondary | ICD-10-CM | POA: Diagnosis not present

## 2016-05-29 DIAGNOSIS — W19XXXA Unspecified fall, initial encounter: Secondary | ICD-10-CM

## 2016-05-29 DIAGNOSIS — E785 Hyperlipidemia, unspecified: Secondary | ICD-10-CM | POA: Insufficient documentation

## 2016-05-29 DIAGNOSIS — Y939 Activity, unspecified: Secondary | ICD-10-CM | POA: Diagnosis not present

## 2016-05-29 DIAGNOSIS — F329 Major depressive disorder, single episode, unspecified: Secondary | ICD-10-CM | POA: Diagnosis not present

## 2016-05-29 DIAGNOSIS — S060X0A Concussion without loss of consciousness, initial encounter: Secondary | ICD-10-CM | POA: Insufficient documentation

## 2016-05-29 DIAGNOSIS — E119 Type 2 diabetes mellitus without complications: Secondary | ICD-10-CM | POA: Insufficient documentation

## 2016-05-29 DIAGNOSIS — Y92009 Unspecified place in unspecified non-institutional (private) residence as the place of occurrence of the external cause: Secondary | ICD-10-CM | POA: Diagnosis not present

## 2016-05-29 DIAGNOSIS — W1839XA Other fall on same level, initial encounter: Secondary | ICD-10-CM | POA: Insufficient documentation

## 2016-05-29 DIAGNOSIS — F17210 Nicotine dependence, cigarettes, uncomplicated: Secondary | ICD-10-CM | POA: Insufficient documentation

## 2016-05-29 DIAGNOSIS — Z792 Long term (current) use of antibiotics: Secondary | ICD-10-CM | POA: Insufficient documentation

## 2016-05-29 DIAGNOSIS — S0181XA Laceration without foreign body of other part of head, initial encounter: Secondary | ICD-10-CM | POA: Diagnosis not present

## 2016-05-29 DIAGNOSIS — Z7984 Long term (current) use of oral hypoglycemic drugs: Secondary | ICD-10-CM | POA: Diagnosis not present

## 2016-05-29 DIAGNOSIS — E039 Hypothyroidism, unspecified: Secondary | ICD-10-CM | POA: Insufficient documentation

## 2016-05-29 DIAGNOSIS — Y999 Unspecified external cause status: Secondary | ICD-10-CM | POA: Insufficient documentation

## 2016-05-29 DIAGNOSIS — J449 Chronic obstructive pulmonary disease, unspecified: Secondary | ICD-10-CM | POA: Diagnosis not present

## 2016-05-29 DIAGNOSIS — S0990XA Unspecified injury of head, initial encounter: Secondary | ICD-10-CM | POA: Diagnosis present

## 2016-05-29 DIAGNOSIS — Z79899 Other long term (current) drug therapy: Secondary | ICD-10-CM | POA: Diagnosis not present

## 2016-05-29 LAB — BASIC METABOLIC PANEL
Anion gap: 8 (ref 5–15)
BUN: 14 mg/dL (ref 6–20)
CO2: 31 mmol/L (ref 22–32)
Calcium: 9.6 mg/dL (ref 8.9–10.3)
Chloride: 99 mmol/L — ABNORMAL LOW (ref 101–111)
Creatinine, Ser: 0.55 mg/dL (ref 0.44–1.00)
GFR calc non Af Amer: >60 mL/min (ref >60)
Glucose, Bld: 99 mg/dL (ref 65–99)
Potassium: 4.5 mmol/L (ref 3.5–5.1)
SODIUM: 138 mmol/L (ref 135–145)

## 2016-05-29 LAB — CBC WITH DIFFERENTIAL/PLATELET
BASOS ABS: 0.1 10*3/uL (ref 0–0.1)
BASOS PCT: 1 %
EOS PCT: 3 %
Eosinophils Absolute: 0.3 10*3/uL (ref 0–0.7)
HCT: 43.3 % (ref 35.0–47.0)
HEMOGLOBIN: 14.2 g/dL (ref 12.0–16.0)
Lymphocytes Relative: 32 %
Lymphs Abs: 3.2 10*3/uL (ref 1.0–3.6)
MCH: 29.7 pg (ref 26.0–34.0)
MCHC: 32.8 g/dL (ref 32.0–36.0)
MCV: 90.6 fL (ref 80.0–100.0)
Monocytes Absolute: 0.7 10*3/uL (ref 0.2–0.9)
Monocytes Relative: 7 %
NEUTROS PCT: 57 %
Neutro Abs: 5.9 10*3/uL (ref 1.4–6.5)
PLATELETS: 240 10*3/uL (ref 150–440)
RBC: 4.78 MIL/uL (ref 3.80–5.20)
RDW: 16.7 % — ABNORMAL HIGH (ref 11.5–14.5)
WBC: 10.2 10*3/uL (ref 3.6–11.0)

## 2016-05-29 LAB — TROPONIN I

## 2016-05-29 MED ORDER — OXYCODONE-ACETAMINOPHEN 5-325 MG PO TABS
1.0000 | ORAL_TABLET | Freq: Once | ORAL | Status: AC
  Administered 2016-05-29: 1 via ORAL

## 2016-05-29 MED ORDER — OXYCODONE-ACETAMINOPHEN 5-325 MG PO TABS
ORAL_TABLET | ORAL | Status: AC
  Administered 2016-05-29: 1 via ORAL
  Filled 2016-05-29: qty 1

## 2016-05-29 MED ORDER — OXYCODONE-ACETAMINOPHEN 5-325 MG PO TABS
1.0000 | ORAL_TABLET | Freq: Once | ORAL | Status: AC
  Administered 2016-05-29: 1 via ORAL
  Filled 2016-05-29: qty 1

## 2016-05-29 NOTE — ED Notes (Signed)
Rolled out of bed while sleeping, hit bridge of nose with nightstand.  ? LOC.  Patient lives at home.

## 2016-05-29 NOTE — ED Provider Notes (Addendum)
[facility] Emergency Department Provider Note   ____________________________________________  Time seen: Approximately [id number] PM  I have reviewed the triage vital signs and the nursing notes.   HISTORY  Chief Complaint Fall and Facial Injury   HPI Denise Case is a 60 y.o. female with a history of COPD, morbid obesity and depression is present to the emergency department after fall. She says she thinks she rolled out of bed and hit her head on the floor. She is complaining of a 10 out of 10 headache. Says that the headache is frontal. Says that she woke up in a pool of blood. She is also here with family who says that she was "in and out" for about 20-30 minutes after the episode. Says that the patient had transient consciousness during this time. No seizure activity noted. Patient denies any chest pain or shortness of breath. Denies any neck pain. Denies being on any anticoagulants. The family is concerned about concussion.   Past Medical History  Diagnosis Date  . COPD (chronic obstructive pulmonary disease) ([place])   . Fibromyalgia   . Thyroid disease   . Morbid obesity ([place])   . GERD (gastroesophageal reflux disease)   . Hyperlipidemia   . Asthma   . Diverticulitis   . Lumbar sprain   . Diabetes mellitus without complication ([place])   . Pneumonia 2015  . Hypothyroidism   . Migraine   . Depression   . Anxiety   . History of kidney stones   . Constipation due to pain medication   . Requires supplemental oxygen     PRN uses 1.5 Liters  . Neuropathy ([facility])     feet and legs  . Tingling sensation     hands  . Dermatitis   . History of panic attacks   . SBO (small bowel obstruction) ([place]) 12/01/2014  . Fatty liver 12/02/2014  . Opiate dependence ([place]) 12/03/2014    Patient Active Problem List   Diagnosis Date Noted  . Urinary retention 12/03/2014  . Opiate dependence ([place]) 12/03/2014  . Benzodiazepine dependence ([place]) 12/03/2014  . Acute upper  respiratory infection 12/03/2014  . Hypothyroidism 12/02/2014  . Chronic pain syndrome 12/02/2014  . History of asthma 12/02/2014  . Fatty liver 12/02/2014  . SBO (small bowel obstruction) ([place]) 12/01/2014  . Chronic cholecystitis s/p lap chole with [facility] 05/25/14 05/25/2014  . CAP (community acquired pneumonia) 05/04/2014  . RUQ abdominal pain 06/20/2013    Past Surgical History  Procedure Laterality Date  . Bladder tack      X 2  . Abdominal hysterectomy    . Cholecystectomy N/A 05/25/2014    Procedure: LAPAROSCOPIC CHOLECYSTECTOMY WITH INTRAOPERATIVE CHOLANGIOGRAM;  Surgeon: Odis Hollingshead, MD;  Location: [facility];  Service: General;  Laterality: N/A;  . Colonoscopy with propofol N/A 04/05/2015    Procedure: COLONOSCOPY WITH PROPOFOL;  Surgeon: Juanita Craver, MD;  Location: [facility];  Service: Endoscopy;  Laterality: N/A;    Current Outpatient Rx  Name  Route  Sig  Dispense  Refill  . ALPRAZolam (XANAX) 1 MG tablet   Oral   Take 1 mg by mouth 3 (three) times daily as needed for anxiety.         Marland Kitchen amitriptyline (ELAVIL) 25 MG tablet   Oral   Take 25 mg by mouth at bedtime.         . calcium carbonate (OS-CAL) 600 MG TABS tablet   Oral   Take 600 mg by mouth  2 (two) times daily with a meal.         . CALCIUM-VITAMIN D PO   Oral   Take 1 tablet by mouth every morning.          . cephALEXin (KEFLEX) 500 MG capsule   Oral   Take 1 capsule (500 mg total) by mouth 3 (three) times daily.   30 capsule   0   . Cinnamon 500 MG capsule   Oral   Take 500 mg by mouth daily.         . cyanocobalamin 1000 MCG tablet   Oral   Take 1,000 mcg by mouth every morning.          Marland Kitchen dexlansoprazole (DEXILANT) 60 MG capsule   Oral   Take 60 mg by mouth every morning.          . Dulaglutide (TRULICITY [place])   Subcutaneous   Inject 1 each into the skin once a week. Sunday morning.         . ergocalciferol (VITAMIN D2) 50000 UNITS capsule   Oral   Take 50,000 Units by  mouth once a week. Friday         . FIBER SELECT GUMMIES PO   Oral   Take 3 each by mouth 2 (two) times daily.         . fish oil-omega-3 fatty acids 1000 MG capsule   Oral   Take 1 g by mouth 3 (three) times daily with meals.          Marland Kitchen FLUoxetine (PROZAC) 20 MG capsule   Oral   Take 20 mg by mouth every morning.          . flurazepam (DALMANE) 30 MG capsule   Oral   Take 30 mg by mouth at bedtime as needed for sleep.         Marland Kitchen gabapentin (NEURONTIN) 800 MG tablet   Oral   Take 400-800 mg by mouth 3 (three) times daily. 0.5 tablet (400 mg) in the morning and then 0.5 tablet (400 mg) 8 hours later and 1 tablet (800mg ) at bedtime.         Marland Kitchen GARCINIA CAMBOGIA-CHROMIUM PO   Oral   Take by mouth.         . levothyroxine (SYNTHROID, LEVOTHROID) 75 MCG tablet   Oral   Take 3 tablets (225 mcg total) by mouth daily before breakfast.   90 tablet   1   . metFORMIN (GLUCOPHAGE) 500 MG tablet   Oral   Take 500 mg by mouth daily with breakfast.         . mineral oil liquid   Oral   Take 15-30 mLs by mouth daily as needed for mild constipation (constipation). Constipation         . Multiple Vitamins-Minerals (MULTIVITAMIN GUMMIES ADULT PO)   Oral   Take 3 each by mouth 2 (two) times daily.         . ondansetron (ZOFRAN) 4 MG tablet   Oral   Take 1 tablet (4 mg total) by mouth every 4 (four) hours as needed for nausea or vomiting.   30 tablet   0   . OVER THE COUNTER MEDICATION   Oral   Take 2 each by mouth at bedtime. Dannon Activity Yogurt.         Marland Kitchen oxyCODONE-acetaminophen (ROXICET) 5-325 MG tablet   Oral   Take 1 tablet by mouth every 6 (six) hours as needed.  20 tablet   0   . pseudoephedrine-acetaminophen (TYLENOL SINUS) 30-500 MG TABS   Oral   Take 2 tablets by mouth 2 (two) times daily.         . SUMAtriptan (IMITREX) 100 MG tablet   Oral   Take 100 mg by mouth every 2 (two) hours as needed for migraine or headache. May repeat in 2  hours if headache persists or recurs.         . Tapentadol HCl (NUCYNTA ER) 100 MG TB12   Oral   Take 50 mg by mouth 2 (two) times daily before a meal.   60 tablet   0   . theophylline (THEO-24) 100 MG 24 hr capsule   Oral   Take 300 mg by mouth daily.         . traMADol (ULTRAM) 50 MG tablet   Oral   Take 1-2 tablets (50-100 mg total) by mouth every 6 (six) hours as needed. Patient not taking: Reported on 03/16/2015   30 tablet   0   . vitamin C (ASCORBIC ACID) 500 MG tablet   Oral   Take 500 mg by mouth at bedtime.            Allergies Montelukast sodium; Doxycycline; and Hydrocodone-acetaminophen  No family history on file.  Social History Social History  Substance Use Topics  . Smoking status: Current Every Day Smoker -- 0.50 packs/day for 37 years    Types: Cigarettes  . Smokeless tobacco: Never Used  . Alcohol Use: No    Review of Systems Constitutional: No fever/chills Eyes: No visual changes. ENT: No sore throat. Cardiovascular: Denies chest pain. Respiratory: Denies shortness of breath. Gastrointestinal: No abdominal pain.  No nausea, no vomiting.  No diarrhea.  No constipation. Genitourinary: Negative for dysuria. Musculoskeletal: Negative for back pain. Skin: Negative for rash. Neurological: Negative for focal weakness or numbness.  10-point ROS otherwise negative.  ____________________________________________   PHYSICAL EXAM:  VITAL SIGNS: ED Triage Vitals  Enc Vitals Group     BP 05/29/16 1546 99/57 mmHg     Pulse Rate 05/29/16 1546 87     Resp --      Temp 05/29/16 1546 97.8 F (36.6 C)     Temp Source 05/29/16 1546 Oral     SpO2 05/29/16 1546 93 %     Weight 05/29/16 1546 332 lb (150.594 kg)     Height 05/29/16 1546 5\' 6"  (1.676 m)     Head Cir --      Peak Flow --      Pain Score 05/29/16 1719 10     Pain Loc --      Pain Edu? --      Excl. in [place]? --     Constitutional: Alert and oriented. Well appearing and in no  acute distress. Eyes: Conjunctivae are normal. PERRL. EOMI. Head: Superficial curvilinear laceration between the eyebrows. No active bleeding. Well approximated. Mild tenderness overlying.. Nose: No congestion/rhinnorhea. No tenderness or displacement. Mouth/Throat: Mucous membranes are moist.   Neck: No stridor.  Her tenderness to the midline cervical spine. Ranges freely without restriction.   Cardiovascular: Normal rate, regular rhythm. Grossly normal heart sounds.   Respiratory: Normal respiratory effort.  No retractions. Lungs CTAB. Gastrointestinal: Soft and nontender. No distention. No CVA tenderness. Musculoskeletal: No lower extremity tenderness nor edema.  No joint effusions. No tenderness to the thoracic or lumbar spines. Pelvis stable. No deformity to hips. Neurologic:  Normal speech and language. No  gross focal neurologic deficits are appreciated. Skin:  Skin is warm, dry and intact. No rash noted. Psychiatric: Mood and affect are normal. Speech and behavior are normal.  ____________________________________________   LABS (all labs ordered are listed, but only abnormal results are displayed)  Labs Reviewed  CBC WITH DIFFERENTIAL/PLATELET - Abnormal; Notable for the following:    RDW 16.7 (*)    All other components within normal limits  BASIC METABOLIC PANEL - Abnormal; Notable for the following:    Chloride 99 (*)    All other components within normal limits  TROPONIN I  URINALYSIS COMPLETEWITH MICROSCOPIC ([facility] ONLY)   ____________________________________________  EKG  ED ECG REPORT I, Doran Stabler, the attending physician, personally viewed and interpreted this ECG.   Date: 05/29/2016  EKG Time: 1919  Rate: 74  Rhythm: normal sinus rhythm  Axis: Normal axis  Intervals:none  ST&T Change: No ST segment elevation or depression. Single T-wave inversion in aVL. Lead placement possible reason for T-wave inversion in V2. Similar appearance of EKG from  07/13/2014. ____________________________________________  RADIOLOGY      CT Head Wo Contrast (Final result) Result time: 05/29/16 19:51:52   Final result by Rad Results In Interface (05/29/16 19:51:52)   Narrative:   CLINICAL DATA: Loss of consciousness and fall with trauma to the face.  EXAM: CT HEAD WITHOUT CONTRAST  TECHNIQUE: Contiguous axial images were obtained from the base of the skull through the vertex without intravenous contrast.  COMPARISON: Head CT 03/22/2004  FINDINGS: Brain: No evidence of acute infarction, hemorrhage, extra-axial collection, ventriculomegaly, or mass effect.  Vascular: No hyperdense vessel or unexpected calcification.  Skull: Negative for fracture or focal lesion.  Sinuses/Orbits: No acute findings.  Other: None.  IMPRESSION: No acute intracranial abnormality.   Electronically Signed By: Fidela Salisbury M.D. On: 05/29/2016 19:51    ____________________________________________   PROCEDURES   ____________________________________________   INITIAL IMPRESSION / ASSESSMENT AND PLAN / ED COURSE  Pertinent labs & imaging results that were available during my care of the patient were reviewed by me and considered in my medical decision making (see chart for details).  ----------------------------------------- 8:49 PM on 05/29/2016 -----------------------------------------  Patient with mild improvement after Percocet and her headache. However, very reassuring imaging. Will be discharged home. Patient likely with concussion. Says that she had a tetanus shot just 1 month ago.  No need for suturing of her laceration. It is well approximated and the bleeding is controlled and it is superficial. Patient has not given urine. However, she denies any dysuria. Her symptoms prior to the fall.  ____________________________________________   FINAL CLINICAL IMPRESSION(S) / ED DIAGNOSES  Forehead laceration. Mechanical fall.  Concussion.    NEW MEDICATIONS STARTED DURING THIS VISIT:  New Prescriptions   No medications on file     Note:  This document was prepared using Dragon voice recognition software and may include unintentional dictation errors.  Patient's respiratory rate is 18/m at time of reexamination.  Orbie Pyo, MD 05/29/16 2051  Orbie Pyo, MD 05/29/16 [phone]

## 2016-05-29 NOTE — ED Notes (Signed)
Pt in via triage; pt reports taking a nap today and in her sleep, rolling off the bed, hitting head on nightstand on the way down.  Pt w/ periods of LOC after the fall, disoriented x approximately 20 minutes per pt husband.  Pt with approximately 2 inch laceration across bridge of nose, bleeding controlled at this time.  Pt with complaints of headache.  Pt denies use of blood thinners.  Pt A/Ox4, no immediate distress at this time.

## 2016-05-29 NOTE — Discharge Instructions (Signed)
Concussion, Adult A concussion, or closed-head injury, is a brain injury caused by a direct blow to the head or by a quick and sudden movement (jolt) of the head or neck. Concussions are usually not life-threatening. Even so, the effects of a concussion can be serious. If you have had a concussion before, you are more likely to experience concussion-like symptoms after a direct blow to the head.  CAUSES  Direct blow to the head, such as from running into another player during a soccer game, being hit in a fight, or hitting your head on a hard surface.  A jolt of the head or neck that causes the brain to move back and forth inside the skull, such as in a car crash. SIGNS AND SYMPTOMS The signs of a concussion can be hard to notice. Early on, they may be missed by you, family members, and health care providers. You may look fine but act or feel differently. Symptoms are usually temporary, but they may last for days, weeks, or even longer. Some symptoms may appear right away while others may not show up for hours or days. Every head injury is different. Symptoms include:  Mild to moderate headaches that will not go away.  A feeling of pressure inside your head.  Having more trouble than usual:  Learning or remembering things you have heard.  Answering questions.  Paying attention or concentrating.  Organizing daily tasks.  Making decisions and solving problems.  Slowness in thinking, acting or reacting, speaking, or reading.  Getting lost or being easily confused.  Feeling tired all the time or lacking energy (fatigued).  Feeling drowsy.  Sleep disturbances.  Sleeping more than usual.  Sleeping less than usual.  Trouble falling asleep.  Trouble sleeping (insomnia).  Loss of balance or feeling lightheaded or dizzy.  Nausea or vomiting.  Numbness or tingling.  Increased sensitivity to:  Sounds.  Lights.  Distractions.  Vision problems or eyes that tire  easily.  Diminished sense of taste or smell.  Ringing in the ears.  Mood changes such as feeling sad or anxious.  Becoming easily irritated or angry for little or no reason.  Lack of motivation.  Seeing or hearing things other people do not see or hear (hallucinations). DIAGNOSIS Your health care provider can usually diagnose a concussion based on a description of your injury and symptoms. He or she will ask whether you passed out (lost consciousness) and whether you are having trouble remembering events that happened right before and during your injury. Your evaluation might include:  A brain scan to look for signs of injury to the brain. Even if the test shows no injury, you may still have a concussion.  Blood tests to be sure other problems are not present. TREATMENT  Concussions are usually treated in an emergency department, in urgent care, or at a clinic. You may need to stay in the hospital overnight for further treatment.  Tell your health care provider if you are taking any medicines, including prescription medicines, over-the-counter medicines, and natural remedies. Some medicines, such as blood thinners (anticoagulants) and aspirin, may increase the chance of complications. Also tell your health care provider whether you have had alcohol or are taking illegal drugs. This information may affect treatment.  Your health care provider will send you home with important instructions to follow.  How fast you will recover from a concussion depends on many factors. These factors include how severe your concussion is, what part of your brain was injured,  your age, and how healthy you were before the concussion.  Most people with mild injuries recover fully. Recovery can take time. In general, recovery is slower in older persons. Also, persons who have had a concussion in the past or have other medical problems may find that it takes longer to recover from their current injury. HOME  CARE INSTRUCTIONS General Instructions  Carefully follow the directions your health care provider gave you.  Only take over-the-counter or prescription medicines for pain, discomfort, or fever as directed by your health care provider.  Take only those medicines that your health care provider has approved.  Do not drink alcohol until your health care provider says you are well enough to do so. Alcohol and certain other drugs may slow your recovery and can put you at risk of further injury.  If it is harder than usual to remember things, write them down.  If you are easily distracted, try to do one thing at a time. For example, do not try to watch TV while fixing dinner.  Talk with family members or close friends when making important decisions.  Keep all follow-up appointments. Repeated evaluation of your symptoms is recommended for your recovery.  Watch your symptoms and tell others to do the same. Complications sometimes occur after a concussion. Older adults with a brain injury may have a higher risk of serious complications, such as a blood clot on the brain.  Tell your teachers, school nurse, school counselor, coach, athletic trainer, or work Freight forwarder about your injury, symptoms, and restrictions. Tell them about what you can or cannot do. They should watch for:  Increased problems with attention or concentration.  Increased difficulty remembering or learning new information.  Increased time needed to complete tasks or assignments.  Increased irritability or decreased ability to cope with stress.  Increased symptoms.  Rest. Rest helps the brain to heal. Make sure you:  Get plenty of sleep at night. Avoid staying up late at night.  Keep the same bedtime hours on weekends and weekdays.  Rest during the day. Take daytime naps or rest breaks when you feel tired.  Limit activities that require a lot of thought or concentration. These include:  Doing homework or job-related  work.  Watching TV.  Working on the computer.  Avoid any situation where there is potential for another head injury (football, hockey, soccer, basketball, martial arts, downhill snow sports and horseback riding). Your condition will get worse every time you experience a concussion. You should avoid these activities until you are evaluated by the appropriate follow-up health care providers. Returning To Your Regular Activities You will need to return to your normal activities slowly, not all at once. You must give your body and brain enough time for recovery.  Do not return to sports or other athletic activities until your health care provider tells you it is safe to do so.  Ask your health care provider when you can drive, ride a bicycle, or operate heavy machinery. Your ability to react may be slower after a brain injury. Never do these activities if you are dizzy.  Ask your health care provider about when you can return to work or school. Preventing Another Concussion It is very important to avoid another brain injury, especially before you have recovered. In rare cases, another injury can lead to permanent brain damage, brain swelling, or death. The risk of this is greatest during the first 7-10 days after a head injury. Avoid injuries by:  Wearing a  seat belt when riding in a car.  Drinking alcohol only in moderation.  Wearing a helmet when biking, skiing, skateboarding, skating, or doing similar activities.  Avoiding activities that could lead to a second concussion, such as contact or recreational sports, until your health care provider says it is okay.  Taking safety measures in your home.  Remove clutter and tripping hazards from floors and stairways.  Use grab bars in bathrooms and handrails by stairs.  Place non-slip mats on floors and in bathtubs.  Improve lighting in dim areas. SEEK MEDICAL CARE IF:  You have increased problems paying attention or  concentrating.  You have increased difficulty remembering or learning new information.  You need more time to complete tasks or assignments than before.  You have increased irritability or decreased ability to cope with stress.  You have more symptoms than before. Seek medical care if you have any of the following symptoms for more than 2 weeks after your injury:  Lasting (chronic) headaches.  Dizziness or balance problems.  Nausea.  Vision problems.  Increased sensitivity to noise or light.  Depression or mood swings.  Anxiety or irritability.  Memory problems.  Difficulty concentrating or paying attention.  Sleep problems.  Feeling tired all the time. SEEK IMMEDIATE MEDICAL CARE IF:  You have severe or worsening headaches. These may be a sign of a blood clot in the brain.  You have weakness (even if only in one hand, leg, or part of the face).  You have numbness.  You have decreased coordination.  You vomit repeatedly.  You have increased sleepiness.  One pupil is larger than the other.  You have convulsions.  You have slurred speech.  You have increased confusion. This may be a sign of a blood clot in the brain.  You have increased restlessness, agitation, or irritability.  You are unable to recognize people or places.  You have neck pain.  It is difficult to wake you up.  You have unusual behavior changes.  You lose consciousness. MAKE SURE YOU:  Understand these instructions.  Will watch your condition.  Will get help right away if you are not doing well or get worse.   This information is not intended to replace advice given to you by your health care provider. Make sure you discuss any questions you have with your health care provider.   Document Released: 02/07/2004 Document Revised: 12/08/2014 Document Reviewed: 06/09/2013 [employer] Interactive Patient Education 2016 [employer]  Facial Laceration  A facial laceration is a cut  on the face. These injuries can be painful and cause bleeding. Lacerations usually heal quickly, but they need special care to reduce scarring. DIAGNOSIS  Your health care provider will take a medical history, ask for details about how the injury occurred, and examine the wound to determine how deep the cut is. TREATMENT  Some facial lacerations may not require closure. Others may not be able to be closed because of an increased risk of infection. The risk of infection and the chance for successful closure will depend on various factors, including the amount of time since the injury occurred. The wound may be cleaned to help prevent infection. If closure is appropriate, pain medicines may be given if needed. Your health care provider will use stitches (sutures), wound glue (adhesive), or skin adhesive strips to repair the laceration. These tools bring the skin edges together to allow for faster healing and a better cosmetic outcome. If needed, you may also be given a tetanus  shot. HOME CARE INSTRUCTIONS  Only take over-the-counter or prescription medicines as directed by your health care provider.  Follow your health care provider's instructions for wound care. These instructions will vary depending on the technique used for closing the wound. For Sutures:  Keep the wound clean and dry.   If you were given a bandage (dressing), you should change it at least once a day. Also change the dressing if it becomes wet or dirty, or as directed by your health care provider.   Wash the wound with soap and water 2 times a day. Rinse the wound off with water to remove all soap. Pat the wound dry with a clean towel.   After cleaning, apply a thin layer of the antibiotic ointment recommended by your health care provider. This will help prevent infection and keep the dressing from sticking.   You may shower as usual after the first 24 hours. Do not soak the wound in water until the sutures are removed.    Get your sutures removed as directed by your health care provider. With facial lacerations, sutures should usually be taken out after 4-5 days to avoid stitch marks.   Wait a few days after your sutures are removed before applying any makeup. For Skin Adhesive Strips:  Keep the wound clean and dry.   Do not get the skin adhesive strips wet. You may bathe carefully, using caution to keep the wound dry.   If the wound gets wet, pat it dry with a clean towel.   Skin adhesive strips will fall off on their own. You may trim the strips as the wound heals. Do not remove skin adhesive strips that are still stuck to the wound. They will fall off in time.  For Wound Adhesive:  You may briefly wet your wound in the shower or bath. Do not soak or scrub the wound. Do not swim. Avoid periods of heavy sweating until the skin adhesive has fallen off on its own. After showering or bathing, gently pat the wound dry with a clean towel.   Do not apply liquid medicine, cream medicine, ointment medicine, or makeup to your wound while the skin adhesive is in place. This may loosen the film before your wound is healed.   If a dressing is placed over the wound, be careful not to apply tape directly over the skin adhesive. This may cause the adhesive to be pulled off before the wound is healed.   Avoid prolonged exposure to sunlight or tanning lamps while the skin adhesive is in place.  The skin adhesive will usually remain in place for 5-10 days, then naturally fall off the skin. Do not pick at the adhesive film.  After Healing: Once the wound has healed, cover the wound with sunscreen during the day for 1 full year. This can help minimize scarring. Exposure to ultraviolet light in the first year will darken the scar. It can take 1-2 years for the scar to lose its redness and to heal completely.  SEEK MEDICAL CARE IF:  You have a fever. SEEK IMMEDIATE MEDICAL CARE IF:  You have redness, pain, or  swelling around the wound.   You see ayellowish-white fluid (pus) coming from the wound.    This information is not intended to replace advice given to you by your health care provider. Make sure you discuss any questions you have with your health care provider.   Document Released: 12/25/2004 Document Revised: 12/08/2014 Document Reviewed: 06/30/2013 [employer] Interactive Patient  Education ©2016 [employer] ° °

## 2016-09-24 ENCOUNTER — Ambulatory Visit (INDEPENDENT_AMBULATORY_CARE_PROVIDER_SITE_OTHER): Payer: 59 | Admitting: Neurology

## 2016-09-24 ENCOUNTER — Encounter: Payer: Self-pay | Admitting: Neurology

## 2016-09-24 VITALS — BP 110/72 | HR 88 | Resp 18 | Ht 66.0 in | Wt 330.0 lb

## 2016-09-24 DIAGNOSIS — R51 Headache: Secondary | ICD-10-CM | POA: Diagnosis not present

## 2016-09-24 DIAGNOSIS — R351 Nocturia: Secondary | ICD-10-CM | POA: Diagnosis not present

## 2016-09-24 DIAGNOSIS — R519 Headache, unspecified: Secondary | ICD-10-CM

## 2016-09-24 DIAGNOSIS — G4733 Obstructive sleep apnea (adult) (pediatric): Secondary | ICD-10-CM

## 2016-09-24 DIAGNOSIS — F17200 Nicotine dependence, unspecified, uncomplicated: Secondary | ICD-10-CM | POA: Diagnosis not present

## 2016-09-24 DIAGNOSIS — J984 Other disorders of lung: Secondary | ICD-10-CM | POA: Diagnosis not present

## 2016-09-24 DIAGNOSIS — R400 Somnolence: Secondary | ICD-10-CM | POA: Diagnosis not present

## 2016-09-24 NOTE — Progress Notes (Signed)
Subjective:    Patient ID: Denise Case is a 60 y.o. female.  HPI     [name], MD, PhD [facility] [address], [address] [place], [place] [address]  Dear Dr. Rex Kras,  I saw your patient, Denise Case, upon your kind request in my neurologic clinic today for initial consultation of her sleep disorder, in particular, concern for underlying obstructive sleep apnea. The patient is accompanied by her husband today. As you know, Denise Case is a 60 year old right-handed woman with an underlying complicated medical history of COPD, smoking, reflux disease, hyperlipidemia, diverticulitis, history of small bowel obstruction, history of opiate dependence, neuropathy, constipation, depression, anxiety, headaches, hypothyroidism, type 2 diabetes, fibromyalgia and morbid obesity, who reports snoring, excessive daytime somnolence and witnessed apneic pauses while asleep (per husband) as well as significant nocturia and enuresis, for which she saw Dr. Dorina Hoyer recently. I reviewed the office note from 06/30/2016. I also reviewed your office note from 08/27/2016. Of note, she is on multiple medications, including potentially sedating medications, such as Xanax, amitriptyline, gabapentin. She was diagnosed with diabetic neuropathy years ago, used to see a neurologist at [facility] as I understand. Her Epworth sleepiness score is 21 out of 24, her fatigue score is 61 out of 63. She lives with her husband, she has 3 grown children. She smokes one pack per day, sometimes less, has cut back. She does not drink alcohol, use illicit drugs or drink caffeine. She denies restless leg symptoms. She has nocturia 3-4 times per night and also urinary incontinence, has to sleep with dependence. She wakes up with a headache sometimes.  Her Past Medical History Is Significant For: Past Medical History:  Diagnosis Date  . Anxiety   . Asthma   . Constipation due to pain medication   .  COPD (chronic obstructive pulmonary disease) ([place])   . Depression   . Dermatitis   . Diabetes mellitus without complication ([place])   . Diverticulitis   . Fatty liver 12/02/2014  . Fibromyalgia   . GERD (gastroesophageal reflux disease)   . History of kidney stones   . History of panic attacks   . Hyperlipidemia   . Hypothyroidism   . Lumbar sprain   . Migraine   . Morbid obesity ([place])   . Neuropathy ([facility])    feet and legs  . Opiate dependence ([place]) 12/03/2014  . Pneumonia 2015  . Requires supplemental oxygen    PRN uses 1.5 Liters  . SBO (small bowel obstruction) 12/01/2014  . Thyroid disease   . Tingling sensation    hands    Her Past Surgical History Is Significant For: Past Surgical History:  Procedure Laterality Date  . ABDOMINAL HYSTERECTOMY    . bladder tack     X 2  . CHOLECYSTECTOMY N/A 05/25/2014   Procedure: LAPAROSCOPIC CHOLECYSTECTOMY WITH INTRAOPERATIVE CHOLANGIOGRAM;  Surgeon: Odis Hollingshead, MD;  Location: [facility] ORS;  Service: General;  Laterality: N/A;  . COLONOSCOPY WITH PROPOFOL N/A 04/05/2015   Procedure: COLONOSCOPY WITH PROPOFOL;  Surgeon: Juanita Craver, MD;  Location: [facility] ENDOSCOPY;  Service: Endoscopy;  Laterality: N/A;    Her Family History Is Significant For: No family history on file.  Her Social History Is Significant For: Social History   Social History  . Marital status: Married    Spouse name: N/A  . Number of children: 3  . Years of education: N/A   Social History Main Topics  . Smoking status: Current Every Day Smoker  Packs/day: 1.00    Years: 37.00    Types: Cigarettes  . Smokeless tobacco: Never Used  . Alcohol use No  . Drug use: No  . Sexual activity: Not Asked   Other Topics Concern  . None   Social History Narrative   Denies caffeine use     Her Allergies Are:  Allergies  Allergen Reactions  . Montelukast Sodium Anaphylaxis and Swelling  . Doxycycline Other (See Comments)    Strips lining off of top of tongue, like  thrush  . Hydrocodone-Acetaminophen Other (See Comments)    Migraines (high doses)  :   Her Current Medications Are:  Outpatient Encounter Prescriptions as of 09/24/2016  Medication Sig  . ALPRAZolam (XANAX) 1 MG tablet Take 1 mg by mouth 3 (three) times daily as needed for anxiety.  Marland Kitchen amitriptyline (ELAVIL) 25 MG tablet Take 25 mg by mouth at bedtime.  . calcium carbonate (OS-CAL) 600 MG TABS tablet Take 600 mg by mouth 2 (two) times daily with a meal.  . CALCIUM-VITAMIN D PO Take 1 tablet by mouth every morning.   . Cinnamon 500 MG capsule Take 500 mg by mouth daily.  . cyanocobalamin 1000 MCG tablet Take 1,000 mcg by mouth every morning.   Marland Kitchen dexlansoprazole (DEXILANT) 60 MG capsule Take 60 mg by mouth every morning.   . Dulaglutide (TRULICITY [place]) Inject 1 each into the skin once a week. Sunday morning.  . ergocalciferol (VITAMIN D2) 50000 UNITS capsule Take 50,000 Units by mouth once a week. Friday  . FIBER SELECT GUMMIES PO Take 3 each by mouth 2 (two) times daily.  . fish oil-omega-3 fatty acids 1000 MG capsule Take 1 g by mouth 3 (three) times daily with meals.   Marland Kitchen FLUoxetine (PROZAC) 20 MG capsule Take 20 mg by mouth every morning.   . gabapentin (NEURONTIN) 800 MG tablet Take 400-800 mg by mouth 3 (three) times daily. 0.5 tablet (400 mg) in the morning and then 0.5 tablet (400 mg) 8 hours later and 1 tablet (800mg ) at bedtime.  Marland Kitchen GARCINIA CAMBOGIA-CHROMIUM PO Take by mouth.  . levothyroxine (SYNTHROID, LEVOTHROID) 75 MCG tablet Take 3 tablets (225 mcg total) by mouth daily before breakfast.  . mineral oil liquid Take 15-30 mLs by mouth daily as needed for mild constipation (constipation). Constipation  . Multiple Vitamins-Minerals (MULTIVITAMIN GUMMIES ADULT PO) Take 3 each by mouth 2 (two) times daily.  Marland Kitchen OVER THE COUNTER MEDICATION Take 2 each by mouth at bedtime. Dannon Activity Yogurt.  . pseudoephedrine-acetaminophen (TYLENOL SINUS) 30-500 MG TABS Take 2 tablets by mouth 2  (two) times daily.  . SUMAtriptan (IMITREX) 100 MG tablet Take 100 mg by mouth every 2 (two) hours as needed for migraine or headache. May repeat in 2 hours if headache persists or recurs.  . theophylline (THEO-24) 100 MG 24 hr capsule Take 300 mg by mouth daily.  . vitamin C (ASCORBIC ACID) 500 MG tablet Take 500 mg by mouth at bedtime.   . [DISCONTINUED] cephALEXin (KEFLEX) 500 MG capsule Take 1 capsule (500 mg total) by mouth 3 (three) times daily.  . [DISCONTINUED] flurazepam (DALMANE) 30 MG capsule Take 30 mg by mouth at bedtime as needed for sleep.  . [DISCONTINUED] metFORMIN (GLUCOPHAGE) 500 MG tablet Take 500 mg by mouth daily with breakfast.  . [DISCONTINUED] ondansetron (ZOFRAN) 4 MG tablet Take 1 tablet (4 mg total) by mouth every 4 (four) hours as needed for nausea or vomiting.  . [DISCONTINUED] oxyCODONE-acetaminophen (ROXICET) 5-325 MG tablet  Take 1 tablet by mouth every 6 (six) hours as needed.  . [DISCONTINUED] Tapentadol HCl (NUCYNTA ER) 100 MG TB12 Take 50 mg by mouth 2 (two) times daily before a meal.  . [DISCONTINUED] traMADol (ULTRAM) 50 MG tablet Take 1-2 tablets (50-100 mg total) by mouth every 6 (six) hours as needed. (Patient not taking: Reported on 03/16/2015)   No facility-administered encounter medications on file as of 09/24/2016.   :  Review of Systems:  Out of a complete 14 point review of systems, all are reviewed and negative with the exception of these symptoms as listed below: Review of Systems  Neurological:       Trouble staying asleep, snoring, witnessed apnea, wakes up feeling tired, morning headaches, daytime fatigue, takes naps.   Epworth Sleepiness Scale 0= would never doze 1= slight chance of dozing 2= moderate chance of dozing 3= high chance of dozing  Sitting and reading:3 Watching TV:3 Sitting inactive in a public place (ex. Theater or meeting):3 As a passenger in a car for an hour without a break:3 Lying down to rest in the  afternoon:3 Sitting and talking to someone:3 Sitting quietly after lunch (no alcohol):3 In a car, while stopped in traffic:0 Total:21   Objective:  Neurologic Exam  Physical Exam Physical Examination:   Vitals:   09/24/16 1516  BP: 110/72  Pulse: 88  Resp: 18   General Examination: The patient is a very pleasant 60 y.o. female in no acute distress. She appears well-developed and well-nourished and well groomed. She is morbidly obese.  HEENT: Normocephalic, atraumatic, pupils are equal, round and reactive to light and accommodation. Funduscopic exam is normal with sharp disc margins noted. Extraocular tracking is good without limitation to gaze excursion or nystagmus noted. Normal smooth pursuit is noted. Hearing is grossly intact. Tympanic membranes are clear bilaterally. Face is symmetric with normal facial animation and normal facial sensation. Speech is clear with no dysarthria noted. There is no hypophonia. There is no lip, neck/head, jaw or voice tremor. Neck is supple with full range of passive and active motion. There are no carotid bruits on auscultation. Oropharynx exam reveals: mild mouth dryness, edentulous state, and moderate airway crowding, due to larger tongue, smaller airway entry, larger uvula and tonsils in place. Mallampati is class II. Tongue protrudes centrally and palate elevates symmetrically. Tonsils are 1-2+ in size. Neck size is 19 1/8 inches.   Chest: Clear to auscultation without wheezing, rhonchi or crackles noted.  Heart: S1+S2+0, regular and normal without murmurs, rubs or gallops noted.   Abdomen: Soft, non-tender and non-distended with normal bowel sounds appreciated on auscultation.  Extremities: There is trace pitting edema in the distal lower extremities bilaterally. Pedal pulses are intact.  Skin: Warm and dry without trophic changes noted. There are no varicose veins.  Musculoskeletal: exam reveals no obvious joint deformities, tenderness or  joint swelling or erythema.   Neurologically:  Mental status: The patient is awake, alert and oriented in all 4 spheres. Her immediate and remote memory, attention, language skills and fund of knowledge are appropriate. There is no evidence of aphasia, agnosia, apraxia or anomia. Speech is clear with normal prosody and enunciation. Thought process is linear. Mood is normal and affect is normal.  Cranial nerves II - XII are as described above under HEENT exam. In addition: shoulder shrug is normal with equal shoulder height noted. Motor exam: Normal bulk, strength and tone is noted. There is no drift, tremor or rebound. Romberg is not testable as she  cannot standf narrow based. Reflexes are 1+ in the upper extremities, trace in both knees and absent in the ankles. Fine motor skills are intact. The upper and lower extremities.  Cerebellar testing: No dysmetria or intention tremor on finger to nose testing. Heel to shin is unremarkable bilaterally. There is no truncal or gait ataxia.  Sensory exam: Decreased all modalities in the lower extremities below the knees bilaterally, decreased all modalities in both upper extremities up to the forearms.  Gait, station and balance: She stands with difficulty. She stands wide-based. She has difficulty walking, tandem walk is not possible.   Assessment and Plan:   In summary, LAYSHA FETHEROLF is a very pleasant 60 y.o.-year old female with an underlying complicated medical history of COPD, smoking, reflux disease, hyperlipidemia, diverticulitis, history of small bowel obstruction, history of opiate dependence, neuropathy, constipation, depression, anxiety, headaches, hypothyroidism, type 2 diabetes, fibromyalgia and morbid obesity, whose history and physical exam are in keeping with obstructive sleep apnea (OSA). I had a long chat with the patient and her husband about my findings and the diagnosis of OSA, its prognosis and treatment options. We talked about medical  treatments, surgical interventions and non-pharmacological approaches. I explained in particular the risks and ramifications of untreated moderate to severe OSA, especially with respect to developing cardiovascular disease down the Road, including congestive heart failure, difficult to treat hypertension, cardiac arrhythmias, or stroke. Even type 2 diabetes has, in part, been linked to untreated OSA. Symptoms of untreated OSA include daytime sleepiness, memory problems, mood irritability and mood disorder such as depression and anxiety, lack of energy, as well as recurrent headaches, especially morning headaches. We talked about smoking cessation and trying to maintain a healthy lifestyle in general, as well as the importance of weight control. I encouraged the patient to eat healthy, exercise daily and keep well hydrated, to keep a scheduled bedtime and wake time routine, to not skip any meals and eat healthy snacks in between meals. I advised the patient not to drive when feeling sleepy. I recommended the following at this time: sleep study with potential positive airway pressure titration. (We will score hypopneas at 4% and split the sleep study into diagnostic and treatment portion, if the estimated. 2 hour AHI is >15/h).   I explained the sleep test procedure to the patient and also outlined possible surgical and non-surgical treatment options of OSA, including the use of a custom-made dental device (which would require a referral to a specialist dentist or [facility] surgeon), upper airway surgical options, such as pillar implants, radiofrequency surgery, tongue base surgery, and UPPP (which would involve a referral to an ENT surgeon). Rarely, jaw surgery such as mandibular advancement may be considered.  I also explained the CPAP treatment option to the patient, who indicated that she would be willing to try CPAP if the need arises. I explained the importance of being compliant with PAP treatment, not only for  insurance purposes but primarily to improve Her symptoms, and for the patient's long term health benefit, including to reduce Her cardiovascular risks. I answered all their questions today and the patient and her husband were in agreement. I would like to see her back after the sleep study is completed and encouraged her to call with any interim questions, concerns, problems or updates.   Thank you very much for allowing me to participate in the care of this nice patient. If I can be of any further assistance to you please do not hesitate to call  me at [phone].  Sincerely,   [name], MD, PhD

## 2016-09-24 NOTE — Patient Instructions (Signed)

## 2016-12-15 ENCOUNTER — Ambulatory Visit (INDEPENDENT_AMBULATORY_CARE_PROVIDER_SITE_OTHER): Payer: 59 | Admitting: Neurology

## 2016-12-15 DIAGNOSIS — G4733 Obstructive sleep apnea (adult) (pediatric): Secondary | ICD-10-CM | POA: Diagnosis not present

## 2016-12-15 DIAGNOSIS — G4734 Idiopathic sleep related nonobstructive alveolar hypoventilation: Secondary | ICD-10-CM

## 2016-12-15 DIAGNOSIS — G4761 Periodic limb movement disorder: Secondary | ICD-10-CM

## 2016-12-15 DIAGNOSIS — G4720 Circadian rhythm sleep disorder, unspecified type: Secondary | ICD-10-CM

## 2016-12-19 NOTE — Procedures (Signed)
PATIENT'S NAME:  Denise Case, [place] DOB:      1956-08-12      MR#:    [id number]     DATE OF RECORDING: 12/15/2016 REFERRING M.D.:  Tamsen Roers, MD Study Performed:  Split-Night Titration Study HISTORY: 61 year old woman with a complicated medical history of COPD, smoking, reflux disease, hyperlipidemia, diverticulitis, history of small bowel obstruction, history of opiate dependence, neuropathy, constipation, depression, anxiety, headaches, hypothyroidism, type 2 diabetes, fibromyalgia and morbid obesity, who reports snoring, excessive daytime somnolence and witnessed apneic pauses while asleep (per husband) as well as significant nocturia and enuresis. The patient endorsed the Epworth Sleepiness Scale at 21 points. The patient's weight 331 pounds with a height of 66 (inches), resulting in a BMI of 53.1 kg/m2. The patient's neck circumference measured 19 inches.  CURRENT MEDICATIONS: Elavil, Xanax, Dexilant, Trulicity, Prozac, Neurontin, Synthroid, Imitrex    PROCEDURE:  This is a multichannel digital polysomnogram utilizing the Somnostar 11.2 system.  Electrodes and sensors were applied and monitored per AASM Specifications.   EEG, EOG, Chin and Limb EMG, were sampled at 200 Hz.  ECG, Snore and Nasal Pressure, Thermal Airflow, Respiratory Effort, CPAP Flow and Pressure, Oximetry was sampled at 50 Hz. Digital video and audio were recorded.      BASELINE STUDY WITHOUT CPAP RESULTS:  Lights Out was at 21:20 and Lights On at 05:03 for the night.  Total recording time (TRT) was 243.5, with a total sleep time (TST) of 137 minutes. The patient's sleep latency was 85.5 minutes.  REM latency was 101.5 minutes.  The sleep efficiency was 56.3 %, which is reduced.    SLEEP ARCHITECTURE: WASO (Wake after sleep onset) was 12.5 minutes, Stage N1 was 6.5 minutes, Stage N2 was 55.5 minutes, Stage N3 was 62 minutes and Stage R (REM sleep) was 13 minutes.  The percentages were Stage N1 4.7%, Stage N2 40.5%, Stage N3  45.3%, which is increased, and Stage R (REM sleep) 9.5%.   The arousals were noted as: 9 were spontaneous, 0 were associated with PLMs, 3 were associated with respiratory events.   Audio and video analysis did not show any abnormal or unusual movements, behaviors, phonations or vocalizations.  The patient took 1 bathroom break. Mild intermittent snoring was noted. The EKG was in keeping with normal sinus rhythm (NSR).   RESPIRATORY ANALYSIS:  There were a total of 23 respiratory events:  0 obstructive apneas, 0 central apneas and 0 mixed apneas with a total of 0 apneas and an apnea index (AI) of 0. There were 23 hypopneas with a hypopnea index of 10.1. The patient also had 0 respiratory event related arousals (RERAs).  Snoring was noted.     The total APNEA/HYPOPNEA INDEX (AHI) was 10.1 /hour and the total RESPIRATORY DISTURBANCE INDEX was 10.1 /hour.  4 events occurred in REM sleep and 38 events in NREM. The REM AHI was 18.5, /hour versus a non-REM AHI of 9.2 /hour. The patient spent 123 minutes sleep time in the supine position 214 minutes in non-supine. The supine AHI was 9.3 /hour versus a non-supine AHI of 17.1 /hour.  OXYGEN SATURATION & C02:  The wake baseline 02 saturation was 88%, with the lowest being 52%. Time spent below 89% saturation equaled 137 minutes.  PERIODIC LIMB MOVEMENTS:    The patient had a total of 0 Periodic Limb Movements.  The Periodic Limb Movement (PLM) index was 0 /hour and the PLM Arousal index was 0 /hour.  TITRATION STUDY WITH CPAP RESULTS:   Patient  was fitted with a small Eson 2 nasal mask. CPAP was initiated at 5 cmH20 with heated humidity per AASM split night standards and pressure was advanced to 6 cm, but patient had persistent severe desaturations and was therefore switched to BiPAP at 8/4 cm, and advised to 14/10 cmH20 because of hypopneas, apneas and desaturations. She could not maintain O2 saturations on 14/10 cm only and supplemental O2 was added at 1  lpm and titrated to 2 lpm. At a PAP pressure of 14/10 cm with supplemental O2 at 2 lpm, there was a reduction of the AHI to 0/hour with non-supine REM sleep achieved and O2 nadir of 88%.   Total recording time (TRT) was 220.5 minutes, with a total sleep time (TST) of 200 minutes. The patient's sleep latency was 15.5 minutes. REM latency was 112.5 minutes.  The sleep efficiency was 90.7 %.    SLEEP ARCHITECTURE: Wake after sleep was 6 minutes, Stage N1 4.5 minutes, Stage N2 57 minutes, Stage N3 46.5 minutes and Stage R (REM sleep) 92 minutes. The percentages were: Stage N1 2.3%, Stage N2 28.5%, Stage N3 23.3% and Stage R (REM sleep) 46.%.   The arousals were noted as: 3 were spontaneous, 0 were associated with PLMs, 2 were associated with respiratory events.  RESPIRATORY ANALYSIS:  There were a total of 18 respiratory events: 0 obstructive apneas, 0 central apneas and 0 mixed apneas with a total of 0 apneas and an apnea index (AI) of 0. There were 18 hypopneas with a hypopnea index of 5.4 /hour. The patient also had 0 respiratory event related arousals (RERAs).      The total APNEA/HYPOPNEA INDEX  (AHI) was 5.4 /hour and the total RESPIRATORY DISTURBANCE INDEX was 5.4 /hour.  4 events occurred in REM sleep and 14 events in NREM. The REM AHI was 2.6 /hour versus a non-REM AHI of 7.8 /hour. REM sleep was achieved on a pressure of  cm/h2o (AHI was  .) The patient spent 0% of total sleep time in the supine position. The supine AHI was 0.0 /hour, versus a non-supine AHI of 5.4/hour.  OXYGEN SATURATION & C02:  The wake baseline 02 saturation was 91%, with the lowest being 62%. Time spent below 89% saturation equaled 123 minutes.  PERIODIC LIMB MOVEMENTS:    The patient had a total of 44 Periodic Limb Movements. The Periodic Limb Movement (PLM) index was 13.2 /hour and the PLM Arousal index was 0 /hour.   Post-study, the patient indicated that sleep was better than usual.  POLYSOMNOGRAPHY IMPRESSION :   1. Obstructive Sleep Apnea (OSA)  2. Nocturnal Hypoxemia 3. Dysfunctions associated with sleep stages or arousals from sleep 4. Periodic Limb Movement Disorder (PLMD)  RECOMMENDATIONS: 1. This patient has severe obstructive sleep apnea by oxygen criteria and responded to BiPAP therapy with supplemental oxygen. BiPAP therapy only did not result in adequate oxygen saturations, despite adequate AHI reduction. I will, therefore, start the patient on home BiPAP treatment at a pressure of 14/10 cm via small nasal mask with heated humidity. O2 will be added at 2 lpm. The patient should be reminded to be fully compliant with PAP therapy to improve sleep related symptoms and decrease long term cardiovascular risks. Please note that untreated obstructive sleep apnea carries additional perioperative morbidity. Patients with significant obstructive sleep apnea should receive perioperative PAP therapy and the surgeons and particularly the anesthesiologist should be informed of the diagnosis and the severity of the sleep disordered breathing. 2. The patient should be strongly  encouraged to lose weight and stop smoking. COPD management should be optimized and the use of narcotic pain medication should be minimized or eliminated, if at all possible.  3. Please note that untreated obstructive sleep apnea carries additional perioperative morbidity. Patients with significant obstructive sleep apnea should receive perioperative PAP therapy and the surgeons and particularly the anesthesiologist should be informed of the diagnosis and the severity of the sleep disordered breathing. 4. Very mild PLMs (periodic limb movements of sleep) were noted during the treatment portion of the study only without significant arousals; clinical correlation is recommended. Medication effect from the patient's antidepressant medication should be considered.  5. This study shows sleep fragmentation and abnormal sleep stage percentages; these  are nonspecific findings and per se do not signify an intrinsic sleep disorder or a cause for the patient's sleep-related symptoms. Causes include (but are not limited to) the first night effect of the sleep study, circadian rhythm disturbances, medication effect or an underlying mood disorder or medical problem.  6. The patient will be seen in follow-up by Dr. Rexene Alberts at [facility] for discussion of the test results and further management strategies. The referring provider will be notified of the test results.  I certify that I have reviewed the entire raw data recording prior to the issuance of this report in accordance with the Standards of Accreditation of the American Academy of Sleep Medicine (AASM)   [name], MD, PhD Diplomat, American Board of Psychiatry and Neurology (Neurology and Sleep Medicine)

## 2016-12-19 NOTE — Progress Notes (Signed)
Diana:  Patient referred by Dr. Rex Kras, seen by me on 09/24/16, split study on 12/15/16. Please call and notify patient that the recent sleep study confirmed the diagnosis of severe OSA due to severe O2 desaturations. She did fairly well with BiPAP during the study with improvement of the respiratory events, but needed Oxygen supplementation to maintain proper O2 sats. I would like start the patient on BiPAP therapy at home by prescribing a machine for home use. I placed the order in the chart. The patient will need a follow up appointment with me in 8 to 10 weeks post set up that has to be scheduled; please go ahead and schedule while you have the patient on the phone and make sure patient understands the importance of keeping this window for the FU appointment, as it is often an insurance requirement and failing to adhere to this may result in losing coverage for sleep apnea treatment.  I am not sure, but her chart indicates, that she has been using O2 at night at home?  I will prescribe O2 as well as BiPAP. She may have an existing DME? She is strongly advised to lose weight, stop smoking and limit or eliminate all narcotic pain meds and follow up with her lung doctor for her COPD.  Please re-enforce the importance of compliance with treatment and the need for [place] to monitor compliance data - again an insurance requirement and good feedback for the patient as far as how they are doing.  Also remind patient, that any upcoming CPAP machine or mask issues, should be first addressed with the DME company. Please ask if patient has a preference regarding DME company.  Please arrange for CPAP set up at home through a DME company of patient's choice - once you have spoken to the patient - and faxed/routed report to PCP and referring MD (if other than PCP), you can close this encounter, thanks,   [name], MD, PhD [facility] ([place])

## 2016-12-19 NOTE — Addendum Note (Signed)
Addended by: [name] on: 12/19/2016 02:13 PM   Modules accepted: Orders

## 2016-12-25 ENCOUNTER — Telehealth: Payer: Self-pay | Admitting: *Deleted

## 2016-12-25 ENCOUNTER — Telehealth: Payer: Self-pay

## 2016-12-25 NOTE — Telephone Encounter (Signed)
Received call from patient stating she would like to have a different nose piece for her CPAP machine. She stated she would like one like the nose piece she had during her sleep study here. She stated she doesn't like anything covering her nose. Advised will route her request to Dr Guadelupe Sabin nurse. She verbalized understanding, appreciation.

## 2016-12-25 NOTE — Telephone Encounter (Signed)
-----   Message from [name], MD sent at 12/19/2016  2:13 PM EST ----- Beverlee Nims:  Patient referred by Dr. Rex Kras, seen by me on 09/24/16, split study on 12/15/16. Please call and notify patient that the recent sleep study confirmed the diagnosis of severe OSA due to severe O2 desaturations. She did fairly well with BiPAP during the study with improvement of the respiratory events, but needed Oxygen supplementation to maintain proper O2 sats. I would like start the patient on BiPAP therapy at home by prescribing a machine for home use. I placed the order in the chart. The patient will need a follow up appointment with me in 8 to 10 weeks post set up that has to be scheduled; please go ahead and schedule while you have the patient on the phone and make sure patient understands the importance of keeping this window for the FU appointment, as it is often an insurance requirement and failing to adhere to this may result in losing coverage for sleep apnea treatment.  I am not sure, but her chart indicates, that she has been using O2 at night at home?  I will prescribe O2 as well as BiPAP. She may have an existing DME? She is strongly advised to lose weight, stop smoking and limit or eliminate all narcotic pain meds and follow up with her lung doctor for her COPD.  Please re-enforce the importance of compliance with treatment and the need for [place] to monitor compliance data - again an insurance requirement and good feedback for the patient as far as how they are doing.  Also remind patient, that any upcoming CPAP machine or mask issues, should be first addressed with the DME company. Please ask if patient has a preference regarding DME company.  Please arrange for CPAP set up at home through a DME company of patient's choice - once you have spoken to the patient - and faxed/routed report to PCP and referring MD (if other than PCP), you can close this encounter, thanks,   [name], MD, PhD [facility] ([place])

## 2016-12-25 NOTE — Telephone Encounter (Signed)
Orders have been written for the mask that she used here which works out great.

## 2016-12-25 NOTE — Telephone Encounter (Signed)
I spoke to patient and she is aware of results and recommendations. She is willing to start treatment. I will send orders to [facility] per patient request. PCP will get a copy of the study. Patient will get a letter reminding her to make f/u appt and stress the importance of compliance.

## 2016-12-29 NOTE — Telephone Encounter (Signed)
Patient called wanting to switch companies for CPAP supplies (nose piece).  [facility] will be with [employer] due to [facility] requesting them to pay out of pocket.  Please call [phone] if patient is unavailable on home number.

## 2016-12-29 NOTE — Telephone Encounter (Signed)
I spoke to patient and advised her that I have sent orders to [facility]

## 2019-01-14 ENCOUNTER — Other Ambulatory Visit: Payer: Self-pay | Admitting: Family Medicine

## 2019-01-14 ENCOUNTER — Ambulatory Visit
Admission: RE | Admit: 2019-01-14 | Discharge: 2019-01-14 | Disposition: A | Discharge status: Home / Self Care | Payer: 59 | Source: Ambulatory Visit | Attending: Family Medicine | Admitting: Family Medicine

## 2019-01-14 DIAGNOSIS — M545 Low back pain, unspecified: Secondary | ICD-10-CM

## 2019-01-14 DIAGNOSIS — R0602 Shortness of breath: Secondary | ICD-10-CM

## 2019-02-05 ENCOUNTER — Emergency Department: Payer: 59

## 2019-02-05 ENCOUNTER — Emergency Department
Admission: EM | Admit: 2019-02-05 | Discharge: 2019-02-05 | Disposition: A | Discharge status: Home / Self Care | Payer: 59 | Attending: Emergency Medicine | Admitting: Emergency Medicine

## 2019-02-05 ENCOUNTER — Other Ambulatory Visit: Payer: Self-pay

## 2019-02-05 ENCOUNTER — Encounter: Payer: Self-pay | Admitting: Emergency Medicine

## 2019-02-05 DIAGNOSIS — R55 Syncope and collapse: Secondary | ICD-10-CM | POA: Diagnosis present

## 2019-02-05 DIAGNOSIS — J45909 Unspecified asthma, uncomplicated: Secondary | ICD-10-CM | POA: Insufficient documentation

## 2019-02-05 DIAGNOSIS — E039 Hypothyroidism, unspecified: Secondary | ICD-10-CM | POA: Diagnosis not present

## 2019-02-05 DIAGNOSIS — J189 Pneumonia, unspecified organism: Secondary | ICD-10-CM | POA: Diagnosis not present

## 2019-02-05 DIAGNOSIS — Z7984 Long term (current) use of oral hypoglycemic drugs: Secondary | ICD-10-CM | POA: Diagnosis not present

## 2019-02-05 DIAGNOSIS — H1032 Unspecified acute conjunctivitis, left eye: Secondary | ICD-10-CM

## 2019-02-05 DIAGNOSIS — H10022 Other mucopurulent conjunctivitis, left eye: Secondary | ICD-10-CM | POA: Diagnosis not present

## 2019-02-05 DIAGNOSIS — Z9981 Dependence on supplemental oxygen: Secondary | ICD-10-CM | POA: Insufficient documentation

## 2019-02-05 DIAGNOSIS — E860 Dehydration: Secondary | ICD-10-CM | POA: Insufficient documentation

## 2019-02-05 DIAGNOSIS — F17210 Nicotine dependence, cigarettes, uncomplicated: Secondary | ICD-10-CM | POA: Diagnosis not present

## 2019-02-05 DIAGNOSIS — E119 Type 2 diabetes mellitus without complications: Secondary | ICD-10-CM | POA: Insufficient documentation

## 2019-02-05 DIAGNOSIS — Z79899 Other long term (current) drug therapy: Secondary | ICD-10-CM | POA: Insufficient documentation

## 2019-02-05 LAB — URINALYSIS, COMPLETE (UACMP) WITH MICROSCOPIC
Bacteria, UA: NONE SEEN
Bilirubin Urine: NEGATIVE
Glucose, UA: NEGATIVE mg/dL
Hgb urine dipstick: NEGATIVE
Ketones, ur: NEGATIVE mg/dL
Nitrite: NEGATIVE
Protein, ur: NEGATIVE mg/dL
Specific Gravity, Urine: 1.006 (ref 1.005–1.030)
pH: 7 (ref 5.0–8.0)

## 2019-02-05 LAB — BASIC METABOLIC PANEL
Anion gap: 12 (ref 5–15)
BUN: 13 mg/dL (ref 6–20)
CHLORIDE: 101 mmol/L (ref 98–111)
CO2: 25 mmol/L (ref 22–32)
CREATININE: 0.67 mg/dL (ref 0.44–1.00)
Calcium: 9.1 mg/dL (ref 8.9–10.3)
GFR calc non Af Amer: >60 mL/min (ref >60)
Glucose, Bld: 136 mg/dL — ABNORMAL HIGH (ref 70–99)
POTASSIUM: 3.8 mmol/L (ref 3.5–5.1)
SODIUM: 138 mmol/L (ref 135–145)

## 2019-02-05 LAB — CBC
HEMATOCRIT: 41.7 % (ref 36.0–46.0)
HEMOGLOBIN: 13.1 g/dL (ref 12.0–15.0)
MCH: 30 pg (ref 26.0–34.0)
MCHC: 31.4 g/dL (ref 30.0–36.0)
MCV: 95.6 fL (ref 80.0–100.0)
NRBC: 0 % (ref 0.0–0.2)
Platelets: 302 10*3/uL (ref 150–400)
RBC: 4.36 MIL/uL (ref 3.87–5.11)
RDW: 13.6 % (ref 11.5–15.5)
WBC: 19.6 10*3/uL — AB (ref 4.0–10.5)

## 2019-02-05 LAB — TROPONIN I
Troponin I: <0.03 ng/mL (ref <0.03)
Troponin I: <0.03 ng/mL (ref <0.03)

## 2019-02-05 LAB — INFLUENZA PANEL BY PCR (TYPE A & B)
INFLAPCR: NEGATIVE
Influenza B By PCR: NEGATIVE

## 2019-02-05 MED ORDER — ONDANSETRON 4 MG PO TBDP: 4.0000 mg | ORAL_TABLET | Freq: Three times a day (TID) | ORAL | 0 refills | Status: DC | PRN

## 2019-02-05 MED ORDER — AZITHROMYCIN 500 MG PO TABS
500.0000 mg | ORAL_TABLET | Freq: Once | ORAL | Status: AC
  Administered 2019-02-05: 500 mg via ORAL
  Filled 2019-02-05: qty 1

## 2019-02-05 MED ORDER — IPRATROPIUM-ALBUTEROL 0.5-2.5 (3) MG/3ML IN SOLN
3.0000 mL | Freq: Once | RESPIRATORY_TRACT | Status: AC
  Administered 2019-02-05: 3 mL via RESPIRATORY_TRACT
  Filled 2019-02-05: qty 3

## 2019-02-05 MED ORDER — TRAMADOL HCL 50 MG PO TABS
50.0000 mg | ORAL_TABLET | Freq: Once | ORAL | Status: AC
  Administered 2019-02-05: 50 mg via ORAL
  Filled 2019-02-05: qty 1

## 2019-02-05 MED ORDER — PREDNISONE 20 MG PO TABS: 60.0000 mg | ORAL_TABLET | Freq: Every day | ORAL | 0 refills | Status: AC

## 2019-02-05 MED ORDER — ERYTHROMYCIN 5 MG/GM OP OINT: 1.0000 "application " | TOPICAL_OINTMENT | Freq: Four times a day (QID) | OPHTHALMIC | 0 refills | Status: AC

## 2019-02-05 MED ORDER — ERYTHROMYCIN 5 MG/GM OP OINT
TOPICAL_OINTMENT | Freq: Once | OPHTHALMIC | Status: AC
  Administered 2019-02-05: 1 via OPHTHALMIC
  Filled 2019-02-05: qty 1

## 2019-02-05 MED ORDER — AZITHROMYCIN 250 MG PO TABS: ORAL_TABLET | ORAL | 0 refills | Status: DC

## 2019-02-05 MED ORDER — AMOXICILLIN-POT CLAVULANATE 875-125 MG PO TABS: 1.0000 | ORAL_TABLET | Freq: Two times a day (BID) | ORAL | 0 refills | Status: AC

## 2019-02-05 MED ORDER — ERYTHROMYCIN 5 MG/GM OP OINT: TOPICAL_OINTMENT | Freq: Four times a day (QID) | OPHTHALMIC | Status: DC

## 2019-02-05 MED ORDER — ACETAMINOPHEN 500 MG PO TABS
1000.0000 mg | ORAL_TABLET | Freq: Once | ORAL | Status: AC
  Administered 2019-02-05: 1000 mg via ORAL
  Filled 2019-02-05: qty 2

## 2019-02-05 MED ORDER — SODIUM CHLORIDE 0.9% FLUSH: 3.0000 mL | Freq: Once | INTRAVENOUS | Status: DC

## 2019-02-05 MED ORDER — METHYLPREDNISOLONE SODIUM SUCC 125 MG IJ SOLR
125.0000 mg | Freq: Once | INTRAMUSCULAR | Status: AC
  Administered 2019-02-05: 125 mg via INTRAVENOUS
  Filled 2019-02-05: qty 2

## 2019-02-05 MED ORDER — SODIUM CHLORIDE 0.9 % IV SOLN
1.0000 g | Freq: Once | INTRAVENOUS | Status: AC
  Administered 2019-02-05: 1 g via INTRAVENOUS
  Filled 2019-02-05: qty 10

## 2019-02-05 MED ORDER — SODIUM CHLORIDE 0.9 % IV BOLUS
1000.0000 mL | Freq: Once | INTRAVENOUS | Status: AC
Start: 2019-02-05 — End: 2019-02-05
  Administered 2019-02-05: 1000 mL via INTRAVENOUS

## 2019-02-05 NOTE — ED Notes (Signed)
Patient's spouse reports that patient's eye drainage is new - patient denies and attributes instead to allergies. Patient's spouse reports that patient coughed up blood-tinged sputum post breathing treatment. MD Alfred Levins informed.

## 2019-02-05 NOTE — ED Triage Notes (Signed)
Pt to ED via [facility] from home after near syncope episode. Pt went to the restroom and had a BM, when she was walking out she got dizzy and fell. Pt states that she hit the right side of her face when she fell. Pt denies use of blood thinners. Pt is in NAD at this time. Pt is on PRN O2 at home.

## 2019-02-05 NOTE — ED Notes (Signed)
Patient returned from xray.

## 2019-02-05 NOTE — ED Provider Notes (Signed)
[facility] Emergency Department Provider Note  ____________________________________________  Time seen: Approximately 5:25 PM  I have reviewed the triage vital signs and the nursing notes.   HISTORY  Chief Complaint Near Syncope   HPI Denise Case is a 63 y.o. female with a history of COPD, asthma, smoking, diabetes, fibromyalgia, hypertension, hyperlipidemia, morbidly obesity, OSA on CPAP who presents for evaluation of near syncope.  Patient reports 3 days of generalized weakness, cough productive of brown sputum and shortness of breath.  Today she was walking out of the bathroom after having a bowel movement when she felt dizzy.  She fell and hit her head on the ground.  She thinks she passed out.  She is not on blood thinners.  She is complaining of sharp pain located in the right side of her head and face since the fall.  She reports that the pain is severe.  She denies fever chills, nausea, vomiting, diarrhea.  She does report chest tightness which has been present for the last 3 days.  No personal family history of blood clots, recent or immobilization, leg pain or swelling, hemoptysis, or exogenous hormones.  Past Medical History:  Diagnosis Date  . Anxiety   . Asthma   . Constipation due to pain medication   . COPD (chronic obstructive pulmonary disease) ([place])   . Depression   . Dermatitis   . Diabetes mellitus without complication ([place])   . Diverticulitis   . Fatty liver 12/02/2014  . Fibromyalgia   . GERD (gastroesophageal reflux disease)   . History of kidney stones   . History of panic attacks   . Hyperlipidemia   . Hypothyroidism   . Lumbar sprain   . Migraine   . Morbid obesity ([place])   . Neuropathy    feet and legs  . Opiate dependence ([place]) 12/03/2014  . Pneumonia 2015  . Requires supplemental oxygen    PRN uses 1.5 Liters  . SBO (small bowel obstruction) ([place]) 12/01/2014  . Thyroid disease   . Tingling sensation    hands     Patient Active Problem List   Diagnosis Date Noted  . Urinary retention 12/03/2014  . Opiate dependence ([place]) 12/03/2014  . Benzodiazepine dependence ([place]) 12/03/2014  . Acute upper respiratory infection 12/03/2014  . Hypothyroidism 12/02/2014  . Chronic pain syndrome 12/02/2014  . History of asthma 12/02/2014  . Fatty liver 12/02/2014  . SBO (small bowel obstruction) ([place]) 12/01/2014  . Chronic cholecystitis s/p lap chole with [facility] 05/25/14 05/25/2014  . CAP (community acquired pneumonia) 05/04/2014  . RUQ abdominal pain 06/20/2013    Past Surgical History:  Procedure Laterality Date  . ABDOMINAL HYSTERECTOMY    . bladder tack     X 2  . CHOLECYSTECTOMY N/A 05/25/2014   Procedure: LAPAROSCOPIC CHOLECYSTECTOMY WITH INTRAOPERATIVE CHOLANGIOGRAM;  Surgeon: Odis Hollingshead, MD;  Location: [facility] ORS;  Service: General;  Laterality: N/A;  . COLONOSCOPY WITH PROPOFOL N/A 04/05/2015   Procedure: COLONOSCOPY WITH PROPOFOL;  Surgeon: Juanita Craver, MD;  Location: [facility] ENDOSCOPY;  Service: Endoscopy;  Laterality: N/A;    Prior to Admission medications   Medication Sig Start Date End Date Taking? Authorizing Provider  allopurinol (ZYLOPRIM) 300 MG tablet Take 300 mg by mouth daily. 01/28/19  Yes [provider]  ALPRAZolam Duanne Moron) 1 MG tablet Take 1 mg by mouth 3 (three) times daily as needed for anxiety.   Yes [provider]  amitriptyline (ELAVIL) 25 MG tablet Take 25 mg by  mouth at bedtime.   Yes [provider]  calcium carbonate (OS-CAL) 600 MG TABS tablet Take 600 mg by mouth 2 (two) times daily with a meal.   Yes [provider]  CALCIUM-VITAMIN D PO Take 1 tablet by mouth every morning.    Yes [provider]  Cinnamon 500 MG capsule Take 500 mg by mouth daily.   Yes [provider]  cyanocobalamin 1000 MCG tablet Take 1,000 mcg by mouth every morning.    Yes [provider]  cyclobenzaprine (FLEXERIL) 10 MG tablet Take 10 mg  by mouth 3 (three) times daily as needed for muscle spasms.   Yes [provider]  diclofenac sodium (VOLTAREN) 1 % GEL Apply 2-3 g topically 4 (four) times daily.   Yes [provider]  ergocalciferol (VITAMIN D2) 50000 UNITS capsule Take 50,000 Units by mouth once a week. Friday   Yes [provider]  [place] Take 3 each by mouth 2 (two) times daily.   Yes [provider]  fish oil-omega-3 fatty acids 1000 MG capsule Take 1 g by mouth 3 (three) times daily with meals.    Yes [provider]  FLUoxetine (PROZAC) 10 MG capsule Take 10 mg by mouth daily.    Yes [provider]  furosemide (LASIX) 20 MG tablet Take 30 mg by mouth daily.   Yes [provider]  gabapentin (NEURONTIN) 800 MG tablet Take 400-800 mg by mouth 3 (three) times daily. 0.5 tablet (400 mg) in the morning and then 0.5 tablet (400 mg) 8 hours later and 1 tablet (800mg ) at bedtime.   Yes [provider]  GARCINIA CAMBOGIA-CHROMIUM PO Take by mouth.   Yes [provider]  glipiZIDE (GLUCOTROL) 5 MG tablet Take 5 mg by mouth daily before breakfast.   Yes [provider]  levothyroxine (SYNTHROID, LEVOTHROID) 75 MCG tablet Take 3 tablets (225 mcg total) by mouth daily before breakfast. 12/06/14  Yes Orvan Falconer, MD  Multiple Vitamins-Minerals (MULTIVITAMIN GUMMIES ADULT PO) Take 3 each by mouth 2 (two) times daily.   Yes [provider]  oxybutynin (DITROPAN) 5 MG tablet Take 5-10 mg by mouth 2 (two) times daily.   Yes [provider]  pantoprazole (PROTONIX) 40 MG tablet Take 40 mg by mouth daily. 01/25/19  Yes [provider]  Semaglutide (OZEMPIC, 0.25 OR 0.5 MG/DOSE, [place]) Inject 0.5 mg into the skin once a week.   Yes [provider]  temazepam (RESTORIL) 30 MG capsule Take 30 mg by mouth at bedtime as needed for sleep.   Yes [provider]  theophylline (THEO-24) 100 MG 24 hr capsule  Take 300 mg by mouth daily.   Yes [provider]  traMADol (ULTRAM) 50 MG tablet Take 50-100 mg by mouth every 6 (six) hours as needed.   Yes [provider]  vitamin C (ASCORBIC ACID) 500 MG tablet Take 500 mg by mouth at bedtime.    Yes [provider]  amoxicillin-clavulanate (AUGMENTIN) 875-125 MG tablet Take 1 tablet by mouth 2 (two) times daily for 10 days. 02/05/19 02/15/19  Rudene Re, MD  azithromycin [facility]) 250 MG tablet Take 1 a day for 4 days 02/05/19   Alfred Levins, [place], MD  colchicine (COLCRYS) 0.6 MG tablet Take 0.6 mg by mouth as needed.    [provider]  mineral oil liquid Take 15-30 mLs by mouth daily as needed for mild constipation (constipation). Constipation    [provider]  ondansetron (ZOFRAN ODT) 4 MG disintegrating tablet Take 1 tablet (4 mg total) by mouth every 8 (eight) hours as needed. 02/05/19   Rudene Re, MD  OVER THE COUNTER MEDICATION Take 2 each by mouth at bedtime. [place] Activity Yogurt.    [provider]  predniSONE (DELTASONE) 20 MG tablet Take 3 tablets (60 mg total) by mouth daily for 4 days. 02/05/19 02/09/19  Rudene Re, MD  pseudoephedrine-acetaminophen (TYLENOL SINUS) 30-500 MG TABS Take 2 tablets by mouth 2 (two) times daily.    [provider]  SUMAtriptan (IMITREX) 100 MG tablet Take 100 mg by mouth every 2 (two) hours as needed for migraine or headache. May repeat in 2 hours if headache persists or recurs.    [provider]    Allergies Montelukast sodium; Doxycycline; and Hydrocodone-acetaminophen  No family history on file.  Social History Social History   Tobacco Use  . Smoking status: Current Every Day Smoker    Packs/day: 1.00    Years: 37.00    Pack years: 37.00    Types: Cigarettes  . Smokeless tobacco: Never Used  Substance Use Topics  . Alcohol use: No  . Drug use: No    Review of Systems  Constitutional: Negative for fever.  + near syncope Eyes: Negative for visual changes. ENT: Negative for sore throat. Neck: No neck pain  Cardiovascular: + chest tightness Respiratory: + shortness of breath and cough Gastrointestinal: Negative for abdominal pain, vomiting or diarrhea. Genitourinary: Negative for dysuria. Musculoskeletal: Negative for back pain. Skin: Negative for rash. Neurological: Negative for headaches, weakness or numbness. Psych: No SI or HI  ____________________________________________   PHYSICAL EXAM:  VITAL SIGNS: ED Triage Vitals  Enc Vitals Group     BP 02/05/19 1704 (!) 147/82     Pulse Rate 02/05/19 1704 97     Resp 02/05/19 1704 16     Temp 02/05/19 1704 99.2 F (37.3 C)     Temp Source 02/05/19 1704 Oral     SpO2 02/05/19 1704 94 %     Weight --      Height --      Head Circumference --      Peak Flow --      Pain Score 02/05/19 1705 10     Pain Loc --      Pain Edu? --      Excl. in [place]? --    Full spinal precautions maintained throughout the trauma exam. Constitutional: Alert and oriented. No acute distress. Does not appear intoxicated. HEENT Head: Normocephalic and atraumatic. Face: No facial bony tenderness. Stable midface Ears: No hemotympanum bilaterally. No Battle sign Eyes: No eye injury. PERRL. No raccoon eyes Nose: Nontender. No epistaxis. No rhinorrhea Mouth/Throat: Mucous membranes are moist. No oropharyngeal blood. No dental injury. Airway patent without stridor. Normal voice. Neck: no C-collar in place. No midline c-spine tenderness.  Cardiovascular: Normal rate, regular rhythm. Normal and symmetric distal pulses are present in all extremities. Pulmonary/Chest: Chest wall is stable, tenderness to palpation over the posterior right rib cage. Normal respiratory effort. Breath sounds are diminished bilaterally with no wheezing or crackles. No crepitus.  Abdominal: Soft, nontender, non distended. Musculoskeletal: Nontender with normal full range of motion in all  extremities. No deformities. No thoracic or lumbar midline spinal tenderness. Pelvis is stable. Skin: Skin is warm, dry and intact. No abrasions or contutions. Psychiatric: Speech and behavior are appropriate. Neurological: Normal speech and language. Moves all extremities to command. No gross focal neurologic deficits  are appreciated.  Glascow Coma Score: 4 - Opens eyes on own 6 - Follows simple motor commands 5 - Alert and oriented GCS: 15  ____________________________________________   LABS (all labs ordered are listed, but only abnormal results are displayed)  Labs Reviewed  BASIC METABOLIC PANEL - Abnormal; Notable for the following components:      Result Value   Glucose, Bld 136 (*)    All other components within normal limits  CBC - Abnormal; Notable for the following components:   WBC 19.6 (*)    All other components within normal limits  URINALYSIS, COMPLETE (UACMP) WITH MICROSCOPIC - Abnormal; Notable for the following components:   Color, Urine YELLOW (*)    APPearance HAZY (*)    Leukocytes,Ua MODERATE (*)    All other components within normal limits  TROPONIN I  INFLUENZA PANEL BY PCR (TYPE A & B)  TROPONIN I  CBG MONITORING, ED  POC URINE PREG, ED   ____________________________________________  EKG  ED ECG REPORT I, Rudene Re, the attending physician, personally viewed and interpreted this ECG.  Normal sinus rhythm, rate of 95, normal intervals, normal axis, no ST elevations or depressions, diffuse T wave flattening which is new when compared to prior. ____________________________________________  RADIOLOGY  I have personally reviewed the images performed during this visit and I agree with the Radiologist's read.   Interpretation by Radiologist:  Dg Chest 2 View  Result Date: 02/05/2019 CLINICAL DATA:  Near syncopal episode. EXAM: CHEST - 2 VIEW COMPARISON:  February 05, 2019 FINDINGS: The heart size borderline. The hila and mediastinum are  normal. No pneumothorax. No nodules or masses. Mild bibasilar opacities. Increased interstitial markings diffusely. No other acute abnormalities. IMPRESSION: 1. Mild bibasilar opacities are favored to represent atelectasis. Early infiltrate is considered less likely. 2. Diffuse interstitial opacities may represent pulmonary venous congestion/mild edema versus atypical infection. 3. No other acute abnormalities. Electronically Signed   By: Dorise Bullion III M.D   On: 02/05/2019 19:56   Dg Ribs Unilateral W/chest Right  Result Date: 02/05/2019 CLINICAL DATA:  Near syncopal episode. EXAM: RIGHT RIBS AND CHEST - 3+ VIEW COMPARISON:  January 14, 2019 FINDINGS: The heart size borderline. The hila and mediastinum are unremarkable. Diffuse interstitial opacities. Mild bibasilar atelectasis. No other acute abnormalities. No rib fractures noted. IMPRESSION: 1. No rib fractures noted. 2. Increased interstitial markings on the PA view of the chest. However, the interstitial markings on the right do not persist on the dedicated images of the right ribs suggesting the finding could be artifactual. Recommend a PA and lateral chest x-ray for better evaluation of the lungs. Electronically Signed   By: Dorise Bullion III M.D   On: 02/05/2019 18:24   Ct Head Wo Contrast  Result Date: 02/05/2019 CLINICAL DATA:  Maxillofacial trauma.  Fall, near syncope. EXAM: CT HEAD WITHOUT CONTRAST CT MAXILLOFACIAL WITHOUT CONTRAST TECHNIQUE: Multidetector CT imaging of the head and maxillofacial structures were performed using the standard protocol without intravenous contrast. Multiplanar CT image reconstructions of the maxillofacial structures were also generated. COMPARISON:  05/29/2016 FINDINGS: CT HEAD FINDINGS Brain: No acute intracranial abnormality. Specifically, no hemorrhage, hydrocephalus, mass lesion, acute infarction, or significant intracranial injury. Vascular: No hyperdense vessel or unexpected calcification. Skull: No  acute calvarial abnormality. Other: None CT MAXILLOFACIAL FINDINGS Osseous: No fracture or mandibular dislocation. No destructive process. Orbits: Negative. No traumatic or inflammatory finding. Sinuses: Clear Soft tissues: Negative IMPRESSION: No acute intracranial abnormality. No facial or orbital fracture. Electronically Signed  By: Rolm Baptise M.D.   On: 02/05/2019 18:30   Ct Maxillofacial Wo Contrast  Result Date: 02/05/2019 CLINICAL DATA:  Maxillofacial trauma.  Fall, near syncope. EXAM: CT HEAD WITHOUT CONTRAST CT MAXILLOFACIAL WITHOUT CONTRAST TECHNIQUE: Multidetector CT imaging of the head and maxillofacial structures were performed using the standard protocol without intravenous contrast. Multiplanar CT image reconstructions of the maxillofacial structures were also generated. COMPARISON:  05/29/2016 FINDINGS: CT HEAD FINDINGS Brain: No acute intracranial abnormality. Specifically, no hemorrhage, hydrocephalus, mass lesion, acute infarction, or significant intracranial injury. Vascular: No hyperdense vessel or unexpected calcification. Skull: No acute calvarial abnormality. Other: None CT MAXILLOFACIAL FINDINGS Osseous: No fracture or mandibular dislocation. No destructive process. Orbits: Negative. No traumatic or inflammatory finding. Sinuses: Clear Soft tissues: Negative IMPRESSION: No acute intracranial abnormality. No facial or orbital fracture. Electronically Signed   By: Rolm Baptise M.D.   On: 02/05/2019 18:30      ____________________________________________   PROCEDURES  Procedure(s) performed: None Procedures Critical Care performed:  None ____________________________________________   INITIAL IMPRESSION / ASSESSMENT AND PLAN / ED COURSE   63 y.o. female with a history of COPD intermittently on and off O2 at home, asthma, smoking, diabetes, fibromyalgia, hypertension, hyperlipidemia, morbidly obesity, OSA on CPAP who presents for evaluation of near syncope event leading to  fall and head trauma in the setting of 3 days of productive cough, chest tightness and SOB.  Patient with normal work of breathing, normal sats, decreased air movement bilaterally with no wheezing or crackles.  Low-grade fever of 99.15F.  EKG showing no evidence of acute ischemia or dysrhythmia. Ddx for cough and SOB: PNA, flu, viral URI, bronchitis, pulmonary edema. Ddx for near syncope: dehydration, orthostatics, vasovagal, anemia, sepsis, arrhythmia. Will monitor on telemetry, will give IV fluids, DuoNeb and Solu-Medrol, will swab for the flu.  Will get chest x-ray, head CT and labs.  Clinical Course as of Feb 05 2023  Sat Feb 05, 2019  2017 Patient sating well on 2 L [place]. Has O2 at home. CXR positive for PNA, elevated WBC of 19.6K, no fever, tachycardia, tachypnea. No signs of sepsis here. Patient given rocephin and azithromycin. Discussed admission for IV abx but patient prefers to go home which I think it is reasonable at this time since she does not live alone, has easy f/u with PCP, has oxygen at home. 2nd troponin is pending. If that is negative will dc on azithromycin, augmentin, zofran, prednisone and close f/u with PCP. Patient now complaining of L eye discharge. Mild purulent discharge seen concerning for acute conjunctivitis. Will start patient on erythromycin ointment. Discussed strict return precautions with patient. Care transferred to Dr. Archie Balboa.   [CV]    Clinical Course User Index [CV] Alfred Levins [place], MD     As part of my medical decision making, I reviewed the following data within the [place] notes reviewed and incorporated, Labs reviewed , EKG interpreted , Old EKG reviewed, Old chart reviewed, Radiograph reviewed , Notes from prior ED visits and  Controlled Substance Database    Pertinent labs & imaging results that were available during my care of the patient were reviewed by me and considered in my medical decision making (see chart for  details).    ____________________________________________   FINAL CLINICAL IMPRESSION(S) / ED DIAGNOSES  Final diagnoses:  Near syncope  Dehydration  Community acquired pneumonia, unspecified laterality  Acute bacterial conjunctivitis of left eye      NEW MEDICATIONS STARTED DURING THIS VISIT:  ED Discharge Orders         Ordered    azithromycin (ZITHROMAX) 250 MG tablet     02/05/19 2024    amoxicillin-clavulanate (AUGMENTIN) 875-125 MG tablet  2 times daily     02/05/19 2024    ondansetron (ZOFRAN ODT) 4 MG disintegrating tablet  Every 8 hours PRN     02/05/19 2024    predniSONE (DELTASONE) 20 MG tablet  Daily     02/05/19 2024           Note:  This document was prepared using Dragon voice recognition software and may include unintentional dictation errors.    Rudene Re, MD 02/05/19 2024

## 2019-02-05 NOTE — ED Notes (Signed)
Patient transported to X-ray 

## 2019-02-05 NOTE — ED Notes (Signed)
Pt husband reports that pt has blood tinged sputum and green drainage out of her left eye. Pt reports that the drainage from eye is new since her fall.

## 2019-02-05 NOTE — ED Notes (Signed)
Reviewed discharge instructions, follow-up care, and prescriptions with patient. Patient verbalized understanding of all information reviewed. Patient stable, with no distress noted at this time.    

## 2019-02-05 NOTE — ED Notes (Signed)
ED Provider at bedside. 

## 2019-06-14 ENCOUNTER — Encounter: Payer: Self-pay | Admitting: Cardiology

## 2019-06-14 DIAGNOSIS — I10 Essential (primary) hypertension: Secondary | ICD-10-CM

## 2019-06-14 HISTORY — DX: Essential (primary) hypertension: I10

## 2019-06-16 ENCOUNTER — Ambulatory Visit (INDEPENDENT_AMBULATORY_CARE_PROVIDER_SITE_OTHER): Payer: 59 | Admitting: Cardiology

## 2019-06-16 ENCOUNTER — Other Ambulatory Visit: Payer: Self-pay

## 2019-06-16 ENCOUNTER — Encounter: Payer: Self-pay | Admitting: Cardiology

## 2019-06-16 VITALS — BP 120/70 | HR 93 | Ht 66.0 in | Wt 329.0 lb

## 2019-06-16 DIAGNOSIS — Z716 Tobacco abuse counseling: Secondary | ICD-10-CM | POA: Diagnosis not present

## 2019-06-16 DIAGNOSIS — E119 Type 2 diabetes mellitus without complications: Secondary | ICD-10-CM | POA: Diagnosis not present

## 2019-06-16 DIAGNOSIS — F329 Major depressive disorder, single episode, unspecified: Secondary | ICD-10-CM | POA: Insufficient documentation

## 2019-06-16 DIAGNOSIS — F32A Depression, unspecified: Secondary | ICD-10-CM | POA: Insufficient documentation

## 2019-06-16 DIAGNOSIS — I10 Essential (primary) hypertension: Secondary | ICD-10-CM | POA: Insufficient documentation

## 2019-06-16 DIAGNOSIS — E782 Mixed hyperlipidemia: Secondary | ICD-10-CM | POA: Insufficient documentation

## 2019-06-16 DIAGNOSIS — R079 Chest pain, unspecified: Secondary | ICD-10-CM | POA: Insufficient documentation

## 2019-06-16 NOTE — Progress Notes (Signed)
Patient referred by Tamsen Roers, MD for exertional chest pain  Subjective:   Denise Case, female    DOB: 1955/12/13, 63 y.o.   MRN: [id number]   Chief Complaint  Patient presents with  . Hypertension  . Chest Pain  . New Patient (Initial Visit)    HPI  63 y.o. Caucasian female with hyperlipidemia, NIDDM, morbid obesity, tobacco abuse, referred for evaluation of exertional chest pain  Patient lives with her husband and looks after grandchildren. In May 2020, she had an episode of left sided chest pain that lasted for about 45 min. Episode occurred at rest. She denies any pain with exertion-which is limited in her case. She does endorse chest pain with emotional stress. Patient continues to smoker 1/2 pack a day. She has quit twice in the past, only to relapse.   Past Medical History:  Diagnosis Date  . Anxiety   . Asthma   . Constipation due to pain medication   . COPD (chronic obstructive pulmonary disease) ([place])   . Depression   . Dermatitis   . Diabetes mellitus without complication ([place])   . Diverticulitis   . Fatty liver 12/02/2014  . Fibromyalgia   . GERD (gastroesophageal reflux disease)   . History of kidney stones   . History of panic attacks   . HTN (hypertension) 06/14/2019  . Hyperlipidemia   . Hypothyroidism   . Lumbar sprain   . Migraine   . Morbid obesity ([place])   . Neuropathy    feet and legs  . Opiate dependence ([place]) 12/03/2014  . Pneumonia 2015  . Requires supplemental oxygen    PRN uses 1.5 Liters  . SBO (small bowel obstruction) ([place]) 12/01/2014  . Thyroid disease   . Tingling sensation    hands     Past Surgical History:  Procedure Laterality Date  . ABDOMINAL HYSTERECTOMY    . bladder tack     X 2  . CHOLECYSTECTOMY N/A 05/25/2014   Procedure: LAPAROSCOPIC CHOLECYSTECTOMY WITH INTRAOPERATIVE CHOLANGIOGRAM;  Surgeon: Odis Hollingshead, MD;  Location: [facility];  Service: General;  Laterality: N/A;  . COLONOSCOPY WITH PROPOFOL N/A 04/05/2015    Procedure: COLONOSCOPY WITH PROPOFOL;  Surgeon: Juanita Craver, MD;  Location: [facility];  Service: Endoscopy;  Laterality: N/A;     Social History   Socioeconomic History  . Marital status: Married    Spouse name: Not on file  . Number of children: 3  . Years of education: Not on file  . Highest education level: Not on file  Occupational History  . Not on file  Social Needs  . Financial resource strain: Not on file  . Food insecurity    Worry: Not on file    Inability: Not on file  . Transportation needs    Medical: Not on file    Non-medical: Not on file  Tobacco Use  . Smoking status: Current Every Day Smoker    Packs/day: 1.00    Years: 37.00    Pack years: 37.00    Types: Cigarettes  . Smokeless tobacco: Never Used  Substance and Sexual Activity  . Alcohol use: No  . Drug use: No  . Sexual activity: Not on file  Lifestyle  . Physical activity    Days per week: Not on file    Minutes per session: Not on file  . Stress: Not on file  Relationships  . Social connections    Talks on phone: Not on file  Gets together: Not on file    Attends religious service: Not on file    Active member of club or organization: Not on file    Attends meetings of clubs or organizations: Not on file    Relationship status: Not on file  . Intimate partner violence    Fear of current or ex partner: Not on file    Emotionally abused: Not on file    Physically abused: Not on file    Forced sexual activity: Not on file  Other Topics Concern  . Not on file  Social History Narrative   Denies caffeine use      Family History  Problem Relation Age of Onset  . Diabetes Mother      Current Outpatient Medications on File Prior to Visit  Medication Sig Dispense Refill  . Turmeric 500 MG CAPS Take by mouth daily.    Marland Kitchen allopurinol (ZYLOPRIM) 300 MG tablet Take 300 mg by mouth daily.    Marland Kitchen ALPRAZolam (XANAX) 1 MG tablet Take 1 mg by mouth 3 (three) times daily as needed for  anxiety.    Marland Kitchen amitriptyline (ELAVIL) 25 MG tablet Take 25 mg by mouth at bedtime.    . Cinnamon 500 MG capsule Take 500 mg by mouth daily.    . cyanocobalamin 1000 MCG tablet Take 1,000 mcg by mouth every morning.     . cyclobenzaprine (FLEXERIL) 10 MG tablet Take 10 mg by mouth 3 (three) times daily as needed for muscle spasms.    . diclofenac sodium (VOLTAREN) 1 % GEL Apply 2-3 g topically 4 (four) times daily.    . ergocalciferol (VITAMIN D2) 50000 UNITS capsule Take 50,000 Units by mouth once a week. Friday    . fish oil-omega-3 fatty acids 1000 MG capsule Take 1 g by mouth 3 (three) times daily with meals.     Marland Kitchen FLUoxetine (PROZAC) 10 MG capsule Take 10 mg by mouth daily.     . furosemide (LASIX) 20 MG tablet Take 30 mg by mouth daily.    Marland Kitchen gabapentin (NEURONTIN) 800 MG tablet Take 400-800 mg by mouth 3 (three) times daily. 0.5 tablet (400 mg) in the morning and then 0.5 tablet (400 mg) 8 hours later and 1 tablet ('800mg'$ ) at bedtime.    Marland Kitchen glipiZIDE (GLUCOTROL) 5 MG tablet Take 5 mg by mouth daily before breakfast.    . levothyroxine (SYNTHROID, LEVOTHROID) 75 MCG tablet Take 3 tablets (225 mcg total) by mouth daily before breakfast. 90 tablet 1  . mineral oil liquid Take 15-30 mLs by mouth daily as needed for mild constipation (constipation). Constipation    . Multiple Vitamins-Minerals (MULTIVITAMIN GUMMIES ADULT PO) Take 3 each by mouth 2 (two) times daily.    Marland Kitchen oxybutynin (DITROPAN) 5 MG tablet Take 5-10 mg by mouth 2 (two) times daily.    . pantoprazole (PROTONIX) 40 MG tablet Take 40 mg by mouth daily.    . pseudoephedrine-acetaminophen (TYLENOL SINUS) 30-500 MG TABS Take 2 tablets by mouth as needed.    . Semaglutide (OZEMPIC, 0.25 OR 0.5 MG/DOSE, ) Inject 0.5 mg into the skin once a week.    . temazepam (RESTORIL) 30 MG capsule Take 30 mg by mouth at bedtime as needed for sleep.    . traMADol (ULTRAM) 50 MG tablet Take 50-100 mg by mouth every 6 (six) hours as needed.    . vitamin C  (ASCORBIC ACID) 500 MG tablet Take 500 mg by mouth at bedtime.  No current facility-administered medications on file prior to visit.     Cardiovascular studies:  EKG 06/16/2019: Sinus rhythm 91 bpm. Baseline wander. Otherwise normal EKG.  Recent labs: 01/12/2019: Glucose 123, BUN/Cr 10.0.69. eGFR 95, Na/K 139/3.9. AST/ALT 38/39 (mildly elevated) H/H 14/40. MCV 90. Platelets 303.  Chol 180, TG 275, HDL 36, LDL 105.  TSH 1.3. Total T4 12.5 (mildly elevated) Iro sat 12% (low)   Review of Systems  Constitution: Negative for decreased appetite, malaise/fatigue, weight gain and weight loss.  HENT: Negative for congestion.   Eyes: Negative for visual disturbance.  Cardiovascular: Positive for chest pain. Negative for dyspnea on exertion, leg swelling, palpitations and syncope.  Respiratory: Negative for cough.   Endocrine: Negative for cold intolerance.  Hematologic/Lymphatic: Does not bruise/bleed easily.  Skin: Negative for itching and rash.  Musculoskeletal: Negative for myalgias.  Gastrointestinal: Negative for abdominal pain, nausea and vomiting.  Genitourinary: Negative for dysuria.  Neurological: Negative for dizziness and weakness.  Psychiatric/Behavioral: The patient is not nervous/anxious.   All other systems reviewed and are negative.        Vitals:   06/16/19 1242  BP: 120/70  Pulse: 93  SpO2: 91%     Body mass index is 53.1 kg/m. Filed Weights   06/16/19 1242  Weight: (!) 329 lb (149.2 kg)     Objective:   Physical Exam  Constitutional: She is oriented to person, place, and time. She appears well-developed and well-nourished. No distress.  Morbid obesity  HENT:  Head: Normocephalic and atraumatic.  Eyes: Pupils are equal, round, and reactive to light. Conjunctivae are normal.  Neck: No JVD present.  Cardiovascular: Normal rate, regular rhythm and intact distal pulses.  Pulmonary/Chest: Effort normal and breath sounds normal. She has no  wheezes. She has no rales.  Abdominal: Soft. Bowel sounds are normal. There is no rebound.  Musculoskeletal:        General: No edema.  Lymphadenopathy:    She has no cervical adenopathy.  Neurological: She is alert and oriented to person, place, and time. No cranial nerve deficit.  Skin: Skin is warm and dry.  Psychiatric: She has a normal mood and affect.  Nursing note and vitals reviewed.         Assessment & Recommendations:   63 y.o. Caucasian female with hyperlipidemia, NIDDM, morbid obesity, tobacco abuse, referred for evaluation of exertional chest pain  Chest pain: Typical-worsens with emotional stress, relieves with rest. She has significant risk factors for CAD with diabetes and tobacco abuse. Recommend nuclear stress test- will likely need 2 day protocol due to her body habitus.   Tobacco abuse: Tobacco cessation counseling (CPT [phone]):  - Currently smoking 1/2 packs/day   - Patient was informed of the dangers of tobacco abuse including stroke, cancer, and MI, as well as benefits of tobacco cessation. - Patient is willing to quit at this time. - Approximately 5 mins were spent counseling patient cessation techniques. We discussed various methods to help quit smoking, including deciding on a date to quit, joining a support group, pharmacological agents- nicotine gum/patch/lozenges, chantix. Patient would like to use nicotine patch. She would like to follow up with her PCP regarding this. Marland Kitchen - I will reassess her progress at the next follow-up visit   Thank you for referring the patient to [place]. Please feel free to contact with any questions.  Nigel Mormon, MD [facility] [place] Pager: [phone] Office: [phone] If no answer Cell [phone]

## 2019-06-17 ENCOUNTER — Encounter: Payer: Self-pay | Admitting: Cardiology

## 2019-06-17 DIAGNOSIS — Z716 Tobacco abuse counseling: Secondary | ICD-10-CM | POA: Insufficient documentation

## 2019-06-20 ENCOUNTER — Ambulatory Visit (INDEPENDENT_AMBULATORY_CARE_PROVIDER_SITE_OTHER): Payer: 59

## 2019-06-20 ENCOUNTER — Other Ambulatory Visit: Payer: Self-pay

## 2019-06-20 DIAGNOSIS — R079 Chest pain, unspecified: Secondary | ICD-10-CM

## 2019-06-24 ENCOUNTER — Other Ambulatory Visit: Payer: Self-pay

## 2019-06-24 ENCOUNTER — Other Ambulatory Visit: Payer: 59

## 2019-06-25 IMAGING — CR DG CHEST 2V
2 series · 2 of 2 positions shown · non-contrast
Comparison: February 05, 2019

CLINICAL DATA: Near syncopal episode.

EXAM:
CHEST - 2 VIEW

[chest pa]
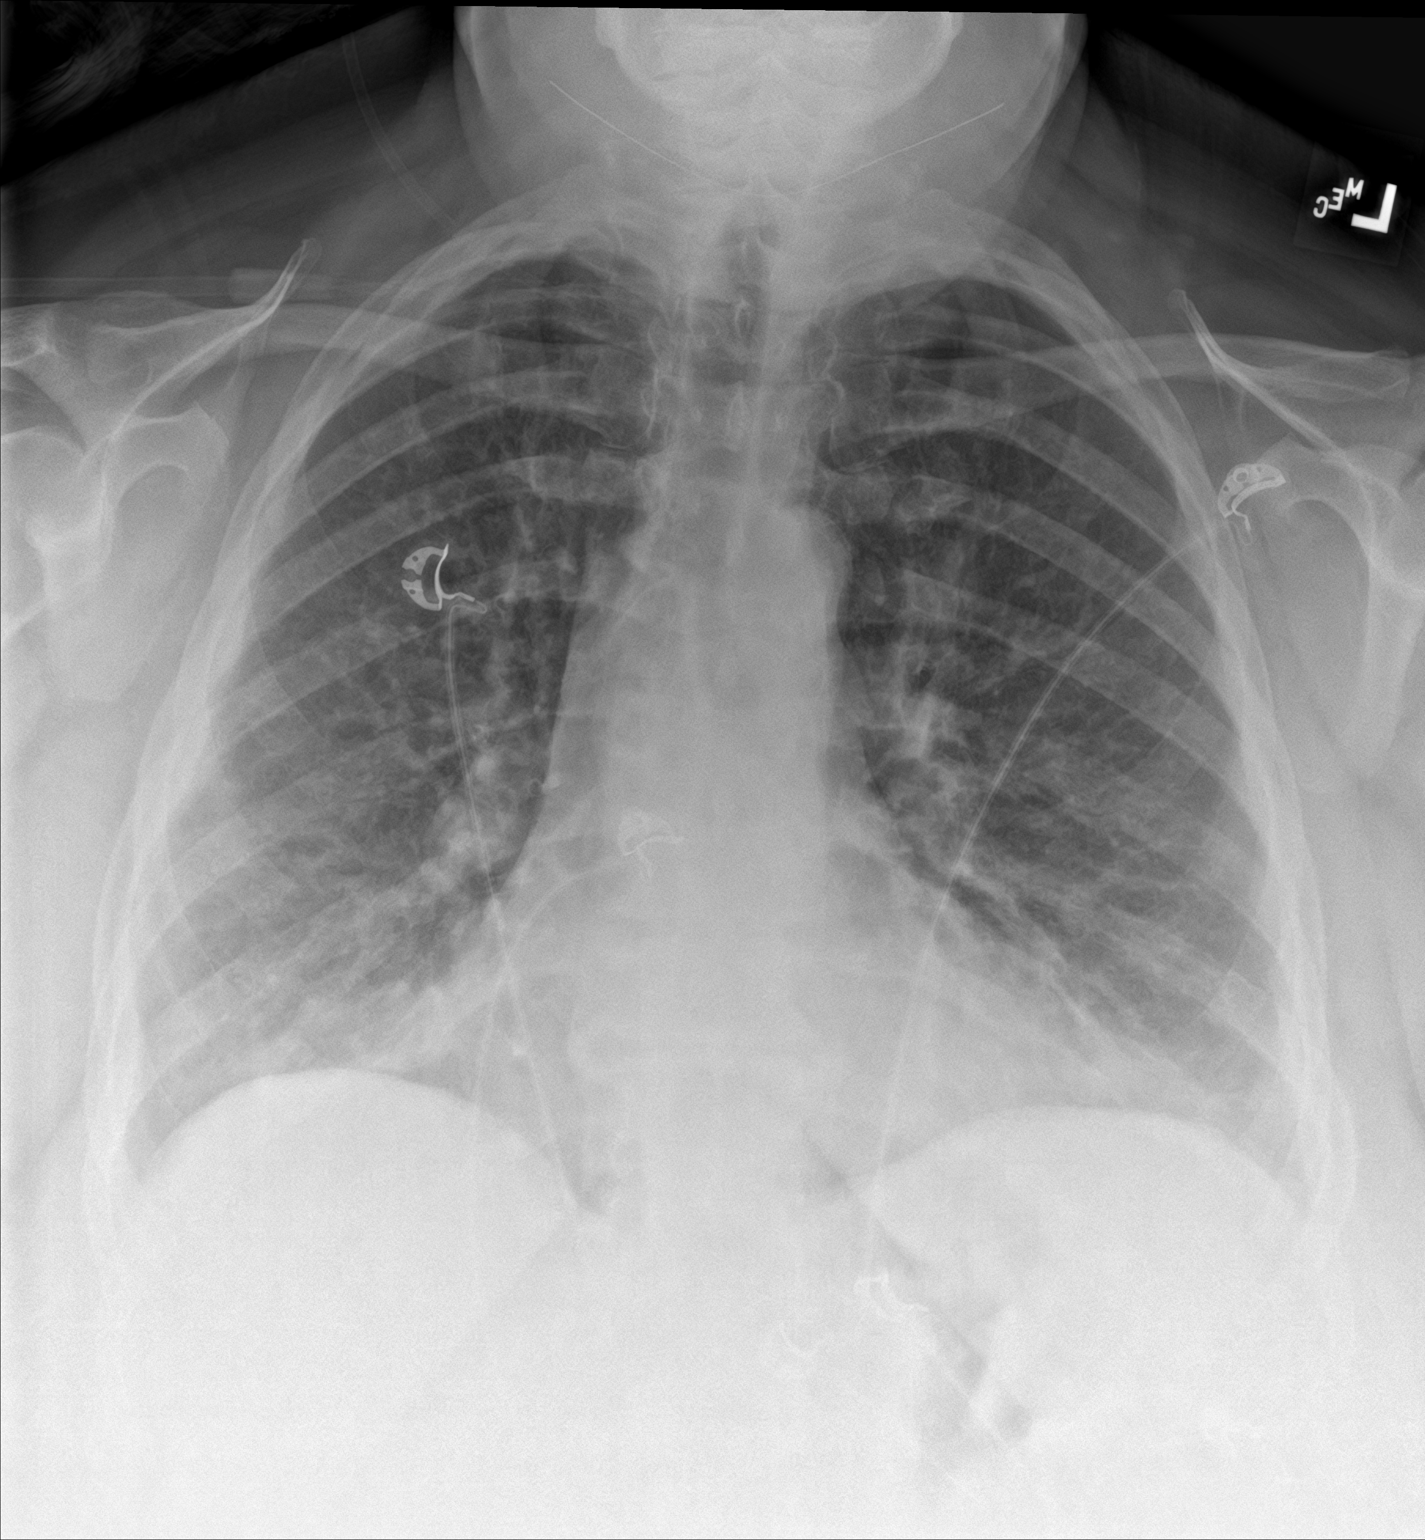

[chest lat]
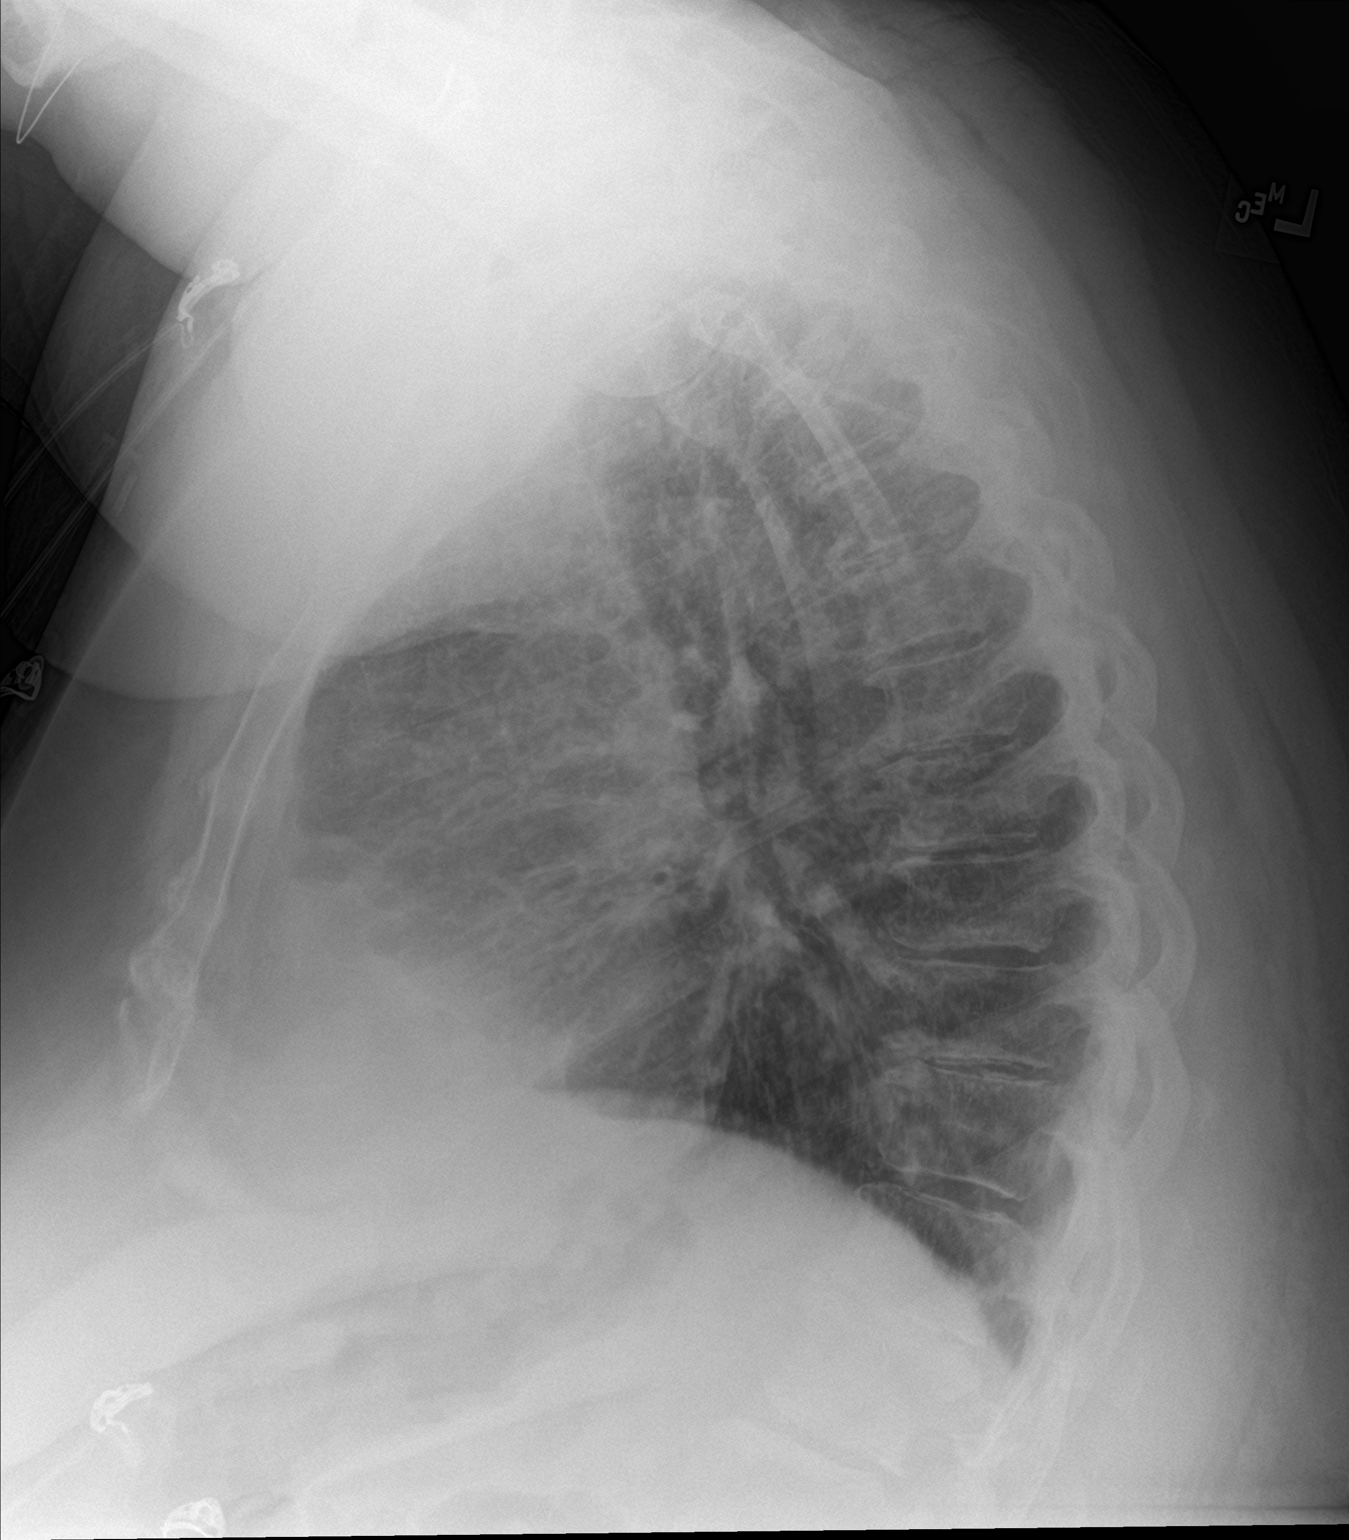

[2 of 2 positions shown; findings below may reference images not displayed]

FINDINGS: The heart size borderline. The hila and mediastinum are normal. No
pneumothorax. No nodules or masses. Mild bibasilar opacities.
Increased interstitial markings diffusely. No other acute
abnormalities.
IMPRESSION: 1. Mild bibasilar opacities are favored to represent atelectasis.
Early infiltrate is considered less likely.
2. Diffuse interstitial opacities may represent pulmonary venous
congestion/mild edema versus atypical infection.
3. No other acute abnormalities.

## 2019-06-27 NOTE — Progress Notes (Signed)
Pt aware , sent to front staff for appointment

## 2019-07-07 ENCOUNTER — Ambulatory Visit (INDEPENDENT_AMBULATORY_CARE_PROVIDER_SITE_OTHER): Payer: 59 | Admitting: Cardiology

## 2019-07-07 ENCOUNTER — Encounter: Payer: Self-pay | Admitting: Cardiology

## 2019-07-07 ENCOUNTER — Other Ambulatory Visit: Payer: Self-pay

## 2019-07-07 DIAGNOSIS — R9439 Abnormal result of other cardiovascular function study: Secondary | ICD-10-CM | POA: Diagnosis not present

## 2019-07-07 MED ORDER — ASPIRIN EC 81 MG PO TBEC: 81.0000 mg | DELAYED_RELEASE_TABLET | Freq: Every day | ORAL | 3 refills | Status: DC

## 2019-07-07 NOTE — Progress Notes (Signed)
Patient referred by Tamsen Roers, MD for exertional chest pain  Subjective:   Denise Case, female    DOB: 06/16/1956, 63 y.o.   MRN: [id number]   I connected with the patient on 07/07/2019 by a telephone call and verified that I am speaking with the correct person using two identifiers.     I offered the patient a video enabled application for a virtual visit. Unfortunately, this could not be accomplished due to technical difficulties/lack of video enabled phone/computer. I discussed the limitations of evaluation and management by telemedicine and the availability of in person appointments. The patient expressed understanding and agreed to proceed.   This visit type was conducted due to national recommendations for restrictions regarding the COVID-19 Pandemic (e.g. social distancing).  This format is felt to be most appropriate for this patient at this time.  All issues noted in this document were discussed and addressed.  No physical exam was performed (except for noted visual exam findings with Tele health visits).  The patient has consented to conduct a Tele health visit and understands insurance will be billed.   Chief Complaint  Patient presents with  . Chest Pain    HPI  63 y.o. Caucasian female with hyperlipidemia, NIDDM, morbid obesity, tobacco abuse, referred for evaluation of exertional chest pain.  Given patient's large body habitus, she underwent a two day stress protocol. Stress test showed abnormalities, as listed below. Patient has not had any recurrence of chest pain. She continues to smoke 1 PPD. She is following up with her PCP soon regarding tobacco cessation. She is currently on fenofibrate for lipid lowering, with plans for lipid panel coming up soon through PCP.   Past Medical History:  Diagnosis Date  . Anxiety   . Asthma   . Constipation due to pain medication   . COPD (chronic obstructive pulmonary disease) ([place])   . Depression   . Dermatitis   .  Diabetes mellitus without complication ([place])   . Diverticulitis   . Fatty liver 12/02/2014  . Fibromyalgia   . GERD (gastroesophageal reflux disease)   . History of kidney stones   . History of panic attacks   . HTN (hypertension) 06/14/2019  . Hyperlipidemia   . Hypothyroidism   . Lumbar sprain   . Migraine   . Morbid obesity ([place])   . Neuropathy    feet and legs  . Opiate dependence ([place]) 12/03/2014  . Pneumonia 2015  . Requires supplemental oxygen    PRN uses 1.5 Liters  . SBO (small bowel obstruction) ([place]) 12/01/2014  . Thyroid disease   . Tingling sensation    hands     Past Surgical History:  Procedure Laterality Date  . ABDOMINAL HYSTERECTOMY    . bladder tack     X 2  . CHOLECYSTECTOMY N/A 05/25/2014   Procedure: LAPAROSCOPIC CHOLECYSTECTOMY WITH INTRAOPERATIVE CHOLANGIOGRAM;  Surgeon: Odis Hollingshead, MD;  [facility];  Service: General;  Laterality: N/A;  . COLONOSCOPY WITH PROPOFOL N/A 04/05/2015   Procedure: COLONOSCOPY WITH PROPOFOL;  Surgeon: Juanita Craver, MD;  Location[facility];  Service: Endoscopy;  Laterality: N/A;     Social History   Socioeconomic History  . Marital status: Married    Spouse name: Not on file  . Number of children: 3  . Years of education: Not on file  . Highest education level: Not on file  Occupational History  . Not on file  Social Needs  . Financial resource strain: Not  on file  . Food insecurity    Worry: Not on file    Inability: Not on file  . Transportation needs    Medical: Not on file    Non-medical: Not on file  Tobacco Use  . Smoking status: Current Every Day Smoker    Packs/day: 1.00    Years: 37.00    Pack years: 37.00    Types: Cigarettes  . Smokeless tobacco: Never Used  Substance and Sexual Activity  . Alcohol use: No  . Drug use: No  . Sexual activity: Not on file  Lifestyle  . Physical activity    Days per week: Not on file    Minutes per session: Not on file  . Stress: Not on file   Relationships  . Social Herbalist on phone: Not on file    Gets together: Not on file    Attends religious service: Not on file    Active member of club or organization: Not on file    Attends meetings of clubs or organizations: Not on file    Relationship status: Not on file  . Intimate partner violence    Fear of current or ex partner: Not on file    Emotionally abused: Not on file    Physically abused: Not on file    Forced sexual activity: Not on file  Other Topics Concern  . Not on file  Social History Narrative   Denies caffeine use      Family History  Problem Relation Age of Onset  . Diabetes Mother      Current Outpatient Medications on File Prior to Visit  Medication Sig Dispense Refill  . allopurinol (ZYLOPRIM) 300 MG tablet Take 300 mg by mouth daily.    Marland Kitchen ALPRAZolam (XANAX) 1 MG tablet Take 1 mg by mouth 3 (three) times daily as needed for anxiety.    Marland Kitchen amitriptyline (ELAVIL) 25 MG tablet Take 25 mg by mouth at bedtime.    . Cinnamon 500 MG capsule Take 500 mg by mouth daily.    . cyanocobalamin 1000 MCG tablet Take 1,000 mcg by mouth every morning.     . cyclobenzaprine (FLEXERIL) 10 MG tablet Take 10 mg by mouth 3 (three) times daily as needed for muscle spasms.    . diclofenac sodium (VOLTAREN) 1 % GEL Apply 2-3 g topically 4 (four) times daily.    . ergocalciferol (VITAMIN D2) 50000 UNITS capsule Take 50,000 Units by mouth once a week. Friday    . Fenofibrate 40 MG TABS Take by mouth daily.    . fish oil-omega-3 fatty acids 1000 MG capsule Take 1 g by mouth 3 (three) times daily with meals.     Marland Kitchen FLUoxetine (PROZAC) 10 MG capsule Take 10 mg by mouth daily.     . furosemide (LASIX) 20 MG tablet Take 30 mg by mouth daily.    Marland Kitchen gabapentin (NEURONTIN) 800 MG tablet Take 800 mg by mouth 3 (three) times daily.     Marland Kitchen glipiZIDE (GLUCOTROL) 5 MG tablet Take 5 mg by mouth daily before breakfast.    . levothyroxine (SYNTHROID, LEVOTHROID) 75 MCG tablet Take  3 tablets (225 mcg total) by mouth daily before breakfast. 90 tablet 1  . mineral oil liquid Take 15-30 mLs by mouth daily as needed for mild constipation (constipation). Constipation    . Multiple Vitamins-Minerals (MULTIVITAMIN GUMMIES ADULT PO) Take 3 each by mouth 2 (two) times daily.    Marland Kitchen oxybutynin (DITROPAN) 5  MG tablet Take 5-10 mg by mouth 2 (two) times daily. 1 at 6 am, 2 at 10 am    . pantoprazole (PROTONIX) 40 MG tablet Take 40 mg by mouth daily.    . pseudoephedrine-acetaminophen (TYLENOL SINUS) 30-500 MG TABS Take 2 tablets by mouth as needed.    . Semaglutide (OZEMPIC, 0.25 OR 0.5 MG/DOSE, [place]) Inject 0.5 mg into the skin once a week.    . temazepam (RESTORIL) 30 MG capsule Take 30 mg by mouth daily.     . theophylline (Denise-24) 300 MG 24 hr capsule Take 300 mg by mouth daily.    . traMADol (ULTRAM) 50 MG tablet Take 50-100 mg by mouth every 6 (six) hours as needed.    . Turmeric 500 MG CAPS Take by mouth daily.    . vitamin C (ASCORBIC ACID) 500 MG tablet Take 500 mg by mouth at bedtime.      No current facility-administered medications on file prior to visit.     Cardiovascular studies:  Lexiscan Sestamibi stress test 06/24/2019: Lexiscan study performed. Stress EKG is non-diagnostic, as this is pharmacological stress test. Poor quality study due to large body habitus-BMI 53.1 Breast attenuation throughout rest study. SPECT images show small size, medium intensity, reduced tracer uptake in inferior apical myocardium. seen only in stress images. In addition, there is large area of medium intensity, decreased tracer uptake in inferior, inferolateral myocardium, which is likely due to breast attenuation. All segments of left ventricle demonstrated mild reduction in global wall motion and thickening. Stress LV EF is mildly reduced at 47%.  Normal myocardial perfusion. TID is 1.10 High risk study due to reduced LVEF and regional perfusion defects. Recommend clinical correlation.    EKG 06/16/2019: Sinus rhythm 91 bpm. Baseline wander. Otherwise normal EKG.  Recent labs: 01/12/2019: Glucose 123, BUN/Cr 10.0.69. eGFR 95, Na/K 139/3.9. AST/ALT 38/39 (mildly elevated) H/H 14/40. MCV 90. Platelets 303.  Chol 180, TG 275, HDL 36, LDL 105.  TSH 1.3. Total T4 12.5 (mildly elevated) Iro sat 12% (low)   Review of Systems  Constitution: Negative for decreased appetite, malaise/fatigue, weight gain and weight loss.  HENT: Negative for congestion.   Eyes: Negative for visual disturbance.  Cardiovascular: Positive for chest pain. Negative for dyspnea on exertion, leg swelling, palpitations and syncope.  Respiratory: Negative for cough.   Endocrine: Negative for cold intolerance.  Hematologic/Lymphatic: Does not bruise/bleed easily.  Skin: Negative for itching and rash.  Musculoskeletal: Negative for myalgias.  Gastrointestinal: Negative for abdominal pain, nausea and vomiting.  Genitourinary: Negative for dysuria.  Neurological: Negative for dizziness and weakness.  Psychiatric/Behavioral: The patient is not nervous/anxious.   All other systems reviewed and are negative.        No vitals available.  Objective:   Physical Exam  Constitutional: She is oriented to person, place, and time. She appears well-developed and well-nourished. No distress.  Morbid obesity  HENT:  Head: Normocephalic and atraumatic.  Eyes: Pupils are equal, round, and reactive to light. Conjunctivae are normal.  Neck: No JVD present.  Cardiovascular: Normal rate, regular rhythm and intact distal pulses.  Pulmonary/Chest: Effort normal and breath sounds normal. She has no wheezes. She has no rales.  Abdominal: Soft. Bowel sounds are normal. There is no rebound.  Musculoskeletal:        General: No edema.  Lymphadenopathy:    She has no cervical adenopathy.  Neurological: She is alert and oriented to person, place, and time. No cranial nerve deficit.  Skin: Skin  is warm and dry.   Psychiatric: She has a normal mood and affect.  Nursing note and vitals reviewed.         Assessment & Recommendations:   63 y.o. Caucasian female with hyperlipidemia, NIDDM, morbid obesity, tobacco abuse, referred for evaluation of exertional chest pain  Chest pain: Typical-worsens with emotional stress, relieves with rest. No recent recurrence. Stress test does show abnormalities, as listed above. Given her body habitus, artifact is high possibility. Given that she has no ongoing symptoms, recommend conservative management. In absence of bleeding issues, and presence of CAD risk factors, recommend Aspirin EC 81 mg daily. Strongly recommend statin, She would like to follow up with PCP regarding this.   Tobacco abuse: Re-emphasiezd smoking cessation.  I will see her back in 3 months to reassess her symptoms and need for coronary angiography.  Nigel Mormon, MD [facility] [place] Pager: [phone] Office: [phone] If no answer Cell [phone]

## 2019-07-13 ENCOUNTER — Ambulatory Visit: Payer: 59 | Admitting: Cardiology

## 2019-10-07 ENCOUNTER — Ambulatory Visit: Payer: 59 | Admitting: Cardiology

## 2022-07-24 ENCOUNTER — Emergency Department (HOSPITAL_COMMUNITY): Payer: 59

## 2022-07-24 ENCOUNTER — Encounter (HOSPITAL_COMMUNITY): Payer: Self-pay | Admitting: Emergency Medicine

## 2022-07-24 ENCOUNTER — Emergency Department (HOSPITAL_COMMUNITY)
Admission: EM | Admit: 2022-07-24 | Discharge: 2022-07-24 | Disposition: A | Discharge status: Home / Self Care | Payer: 59 | Attending: Emergency Medicine | Admitting: Emergency Medicine

## 2022-07-24 ENCOUNTER — Other Ambulatory Visit: Payer: Self-pay

## 2022-07-24 DIAGNOSIS — Z7984 Long term (current) use of oral hypoglycemic drugs: Secondary | ICD-10-CM | POA: Diagnosis not present

## 2022-07-24 DIAGNOSIS — Z7982 Long term (current) use of aspirin: Secondary | ICD-10-CM | POA: Diagnosis not present

## 2022-07-24 DIAGNOSIS — U071 COVID-19: Secondary | ICD-10-CM | POA: Insufficient documentation

## 2022-07-24 DIAGNOSIS — R059 Cough, unspecified: Secondary | ICD-10-CM | POA: Diagnosis present

## 2022-07-24 LAB — BASIC METABOLIC PANEL
Anion gap: 10 (ref 5–15)
BUN: 8 mg/dL (ref 8–23)
CO2: 23 mmol/L (ref 22–32)
Calcium: 9 mg/dL (ref 8.9–10.3)
Chloride: 104 mmol/L (ref 98–111)
Creatinine, Ser: 0.68 mg/dL (ref 0.44–1.00)
GFR, Estimated: >60 mL/min (ref >60)
Glucose, Bld: 186 mg/dL — ABNORMAL HIGH (ref 70–99)
Potassium: 3.6 mmol/L (ref 3.5–5.1)
Sodium: 137 mmol/L (ref 135–145)

## 2022-07-24 LAB — CBC WITH DIFFERENTIAL/PLATELET
Abs Immature Granulocytes: 0.03 10*3/uL (ref 0.00–0.07)
Basophils Absolute: 0.1 10*3/uL (ref 0.0–0.1)
Basophils Relative: 1 %
Eosinophils Absolute: 0.1 10*3/uL (ref 0.0–0.5)
Eosinophils Relative: 1 %
HCT: 42.1 % (ref 36.0–46.0)
Hemoglobin: 13.9 g/dL (ref 12.0–15.0)
Immature Granulocytes: 0 %
Lymphocytes Relative: 25 %
Lymphs Abs: 2.3 10*3/uL (ref 0.7–4.0)
MCH: 31.7 pg (ref 26.0–34.0)
MCHC: 33 g/dL (ref 30.0–36.0)
MCV: 96.1 fL (ref 80.0–100.0)
Monocytes Absolute: 0.8 10*3/uL (ref 0.1–1.0)
Monocytes Relative: 9 %
Neutro Abs: 6 10*3/uL (ref 1.7–7.7)
Neutrophils Relative %: 64 %
Platelets: 209 10*3/uL (ref 150–400)
RBC: 4.38 MIL/uL (ref 3.87–5.11)
RDW: 13.9 % (ref 11.5–15.5)
WBC: 9.3 10*3/uL (ref 4.0–10.5)
nRBC: 0 % (ref 0.0–0.2)

## 2022-07-24 LAB — SARS CORONAVIRUS 2 BY RT PCR: SARS Coronavirus 2 by RT PCR: POSITIVE — AB

## 2022-07-24 MED ORDER — NIRMATRELVIR/RITONAVIR (PAXLOVID)TABLET: 3.0000 | ORAL_TABLET | Freq: Two times a day (BID) | ORAL | 0 refills | Status: AC

## 2022-07-24 MED ORDER — ALBUTEROL SULFATE HFA 108 (90 BASE) MCG/ACT IN AERS: 2.0000 | INHALATION_SPRAY | Freq: Four times a day (QID) | RESPIRATORY_TRACT | 1 refills | Status: AC | PRN

## 2022-07-24 NOTE — ED Provider Notes (Signed)
[facility] EMERGENCY DEPARTMENT Provider Note   CSN: [id number] Arrival date & time: 07/24/22  7106     History  Chief Complaint  Patient presents with   URI    Denise Case is a 66 y.o. female.  Pt complains of a cough and congestion.  Pt exposed to covid and husband has covid   The history is provided by the patient. No language interpreter was used.  URI Presenting symptoms: congestion, cough and fever   Progression:  Worsening Chronicity:  New Relieved by:  Nothing Worsened by:  Nothing Ineffective treatments:  None tried Associated symptoms: headaches   Risk factors: sick contacts        Home Medications Prior to Admission medications   Medication Sig Start Date End Date Taking? Authorizing Provider  allopurinol (ZYLOPRIM) 300 MG tablet Take 300 mg by mouth daily. 01/28/19   [provider]  ALPRAZolam Duanne Moron) 1 MG tablet Take 1 mg by mouth 3 (three) times daily as needed for anxiety.    [provider]  amitriptyline (ELAVIL) 25 MG tablet Take 25 mg by mouth at bedtime.    [provider]  aspirin EC 81 MG tablet Take 1 tablet (81 mg total) by mouth daily. 07/07/19   Patwardhan, Reynold Bowen, MD  Cinnamon 500 MG capsule Take 500 mg by mouth daily.    [provider]  cyanocobalamin 1000 MCG tablet Take 1,000 mcg by mouth every morning.     [provider]  cyclobenzaprine (FLEXERIL) 10 MG tablet Take 10 mg by mouth 3 (three) times daily as needed for muscle spasms.    [provider]  diclofenac sodium (VOLTAREN) 1 % GEL Apply 2-3 g topically 4 (four) times daily.    [provider]  ergocalciferol (VITAMIN D2) 50000 UNITS capsule Take 50,000 Units by mouth once a week. Friday    [provider]  Fenofibrate 40 MG TABS Take by mouth daily.    [provider]  fish oil-omega-3 fatty acids 1000 MG capsule Take 1 g by mouth 3 (three) times daily with meals.     [provider]  FLUoxetine (PROZAC) 10 MG capsule Take 10 mg by mouth daily.     [provider]  furosemide (LASIX) 20 MG tablet Take 30 mg by mouth daily.    [provider]  gabapentin (NEURONTIN) 800 MG tablet Take 800 mg by mouth 3 (three) times daily.     [provider]  glipiZIDE (GLUCOTROL) 5 MG tablet Take 5 mg by mouth daily before breakfast.    [provider]  levothyroxine (SYNTHROID, LEVOTHROID) 75 MCG tablet Take 3 tablets (225 mcg total) by mouth daily before breakfast. 12/06/14   Orvan Falconer, MD  mineral oil liquid Take 15-30 mLs by mouth daily as needed for mild constipation (constipation). Constipation    [provider]  Multiple Vitamins-Minerals (MULTIVITAMIN GUMMIES ADULT PO) Take 3 each by mouth 2 (two) times daily.    [provider]  oxybutynin (DITROPAN) 5 MG tablet Take 5-10 mg by mouth 2 (two) times daily. 1 at 6 am, 2 at 10 am    [provider]  pantoprazole (PROTONIX) 40 MG tablet Take 40 mg by mouth daily. 01/25/19   [provider]  pseudoephedrine-acetaminophen (TYLENOL SINUS) 30-500 MG TABS Take 2 tablets by mouth as needed.    [provider]  Semaglutide (OZEMPIC, 0.25 OR 0.5 MG/DOSE, [place]) Inject 0.5 mg into the skin once a  week.    [provider]  temazepam (RESTORIL) 30 MG capsule Take 30 mg by mouth daily.     [provider]  theophylline (THEO-24) 300 MG 24 hr capsule Take 300 mg by mouth daily.    [provider]  traMADol (ULTRAM) 50 MG tablet Take 50-100 mg by mouth every 6 (six) hours as needed.    [provider]  Turmeric 500 MG CAPS Take by mouth daily.    [provider]  vitamin C (ASCORBIC ACID) 500 MG tablet Take 500 mg by mouth at bedtime.     [provider]      Allergies    Montelukast sodium, Doxycycline, and Hydrocodone-acetaminophen    Review of Systems   Review of Systems  Constitutional:  Positive for  fever.  HENT:  Positive for congestion.   Respiratory:  Positive for cough.   Neurological:  Positive for headaches.  All other systems reviewed and are negative.   Physical Exam Updated Vital Signs BP 117/61 (BP Location: Right Arm)   Pulse 90   Temp 99 F (37.2 C) (Oral)   Resp 18   SpO2 94%  Physical Exam Vitals and nursing note reviewed.  Constitutional:      Appearance: She is well-developed.  HENT:     Head: Normocephalic.     Mouth/Throat:     Mouth: Mucous membranes are moist.  Eyes:     Pupils: Pupils are equal, round, and reactive to light.  Pulmonary:     Effort: Pulmonary effort is normal.  Abdominal:     General: Abdomen is flat. There is no distension.  Musculoskeletal:        General: Normal range of motion.     Cervical back: Normal range of motion.  Neurological:     General: No focal deficit present.     Mental Status: She is alert and oriented to person, place, and time.     ED Results / Procedures / Treatments   Labs (all labs ordered are listed, but only abnormal results are displayed) Labs Reviewed  SARS CORONAVIRUS 2 BY RT PCR - Abnormal; Notable for the following components:      Result Value   SARS Coronavirus 2 by RT PCR POSITIVE (*)    All other components within normal limits  BASIC METABOLIC PANEL - Abnormal; Notable for the following components:   Glucose, Bld 186 (*)    All other components within normal limits  CBC WITH DIFFERENTIAL/PLATELET    EKG None  Radiology DG Chest 2 View  Result Date: 07/24/2022 CLINICAL DATA:  Cough EXAM: CHEST - 2 VIEW COMPARISON:  02/05/2019 FINDINGS: Cardiac size is within normal limits. Lung fields are clear of any infiltrates or pulmonary edema. Haziness in the lower lung fields may be due to chest wall attenuation. Peribronchial thickening is seen. There is no pleural effusion or pneumothorax. IMPRESSION: Peribronchial thickening suggests possible bronchitis. There is no focal pulmonary  consolidation. There is no pleural effusion. Electronically Signed   By: Elmer Picker M.D.   On: 07/24/2022 09:14    Procedures Procedures    Medications Ordered in ED Medications - No data to display  ED Course/ Medical Decision Making/ A&P                           Medical Decision Making Pt thinks she has covid.  Pt and husband were exposed.  Pt's husband testd positive at urgent care  Amount and/or Complexity of Data Reviewed Independent Historian: spouse External Data Reviewed: notes.    Details: Primary care notes reviewed  Labs: ordered. Decision-making details documented in ED Course.    Details: Labs ordered reviewed an interpreted,  Covid is positive  GFR is normal  Radiology: ordered and independent interpretation performed. Decision-making details documented in ED Course.    Details: Chest xray  no pneumonia   Risk OTC drugs. Prescription drug management. Risk Details: Pt given rx for paxlovid and albuterol.  Pt advised follow up with primary care           Final Clinical Impression(s) / ED Diagnoses Final diagnoses:  COVID    Rx / DC Orders ED Discharge Orders          Ordered    nirmatrelvir/ritonavir EUA (PAXLOVID) 20 x 150 MG & 10 x '100MG'$  TABS  2 times daily        07/24/22 1026    albuterol (VENTOLIN HFA) 108 (90 Base) MCG/ACT inhaler  Every 6 hours PRN        07/24/22 1036          An After Visit Summary was printed and given to the patient.     Fransico Meadow, PA-C 07/24/22 1050    Malvin Johns, MD 07/24/22 1447

## 2022-07-24 NOTE — Discharge Instructions (Signed)
Follow up with your Physician for recheck.  Return if any problems.  

## 2022-07-24 NOTE — ED Triage Notes (Signed)
Pt states she is here for a covid test.  States she was exposed this past weekend upon the passing of her mother in this facility and her mother's roommate at her care facility being diagnosed with covid.  Pt has had cough and nasal congestion, no fever.  And a "burning" in her throat that began last night.

## 2022-08-28 ENCOUNTER — Other Ambulatory Visit: Payer: Self-pay | Admitting: Gastroenterology

## 2022-11-28 ENCOUNTER — Encounter (HOSPITAL_COMMUNITY): Payer: Self-pay | Admitting: Gastroenterology

## 2022-12-05 ENCOUNTER — Encounter (HOSPITAL_COMMUNITY)
Admission: RE | Disposition: A | Discharge status: Home / Self Care | Payer: Self-pay | Source: Home / Self Care | Attending: Gastroenterology

## 2022-12-05 ENCOUNTER — Encounter (HOSPITAL_COMMUNITY): Payer: Self-pay | Admitting: Gastroenterology

## 2022-12-05 ENCOUNTER — Ambulatory Visit (HOSPITAL_BASED_OUTPATIENT_CLINIC_OR_DEPARTMENT_OTHER): Payer: 59 | Admitting: Anesthesiology

## 2022-12-05 ENCOUNTER — Ambulatory Visit (HOSPITAL_COMMUNITY)
Admission: RE | Admit: 2022-12-05 | Discharge: 2022-12-05 | Disposition: A | Discharge status: Home / Self Care | Payer: 59 | Attending: Gastroenterology | Admitting: Gastroenterology

## 2022-12-05 ENCOUNTER — Ambulatory Visit (HOSPITAL_COMMUNITY): Payer: 59 | Admitting: Anesthesiology

## 2022-12-05 ENCOUNTER — Other Ambulatory Visit: Payer: Self-pay

## 2022-12-05 DIAGNOSIS — D122 Benign neoplasm of ascending colon: Secondary | ICD-10-CM | POA: Diagnosis not present

## 2022-12-05 DIAGNOSIS — K219 Gastro-esophageal reflux disease without esophagitis: Secondary | ICD-10-CM | POA: Insufficient documentation

## 2022-12-05 DIAGNOSIS — F17210 Nicotine dependence, cigarettes, uncomplicated: Secondary | ICD-10-CM

## 2022-12-05 DIAGNOSIS — J449 Chronic obstructive pulmonary disease, unspecified: Secondary | ICD-10-CM | POA: Insufficient documentation

## 2022-12-05 DIAGNOSIS — Z79899 Other long term (current) drug therapy: Secondary | ICD-10-CM | POA: Insufficient documentation

## 2022-12-05 DIAGNOSIS — Z7984 Long term (current) use of oral hypoglycemic drugs: Secondary | ICD-10-CM | POA: Diagnosis not present

## 2022-12-05 DIAGNOSIS — Z1211 Encounter for screening for malignant neoplasm of colon: Secondary | ICD-10-CM | POA: Insufficient documentation

## 2022-12-05 DIAGNOSIS — K6389 Other specified diseases of intestine: Secondary | ICD-10-CM

## 2022-12-05 DIAGNOSIS — D128 Benign neoplasm of rectum: Secondary | ICD-10-CM

## 2022-12-05 DIAGNOSIS — I10 Essential (primary) hypertension: Secondary | ICD-10-CM | POA: Insufficient documentation

## 2022-12-05 DIAGNOSIS — Z9981 Dependence on supplemental oxygen: Secondary | ICD-10-CM | POA: Insufficient documentation

## 2022-12-05 DIAGNOSIS — K621 Rectal polyp: Secondary | ICD-10-CM | POA: Diagnosis not present

## 2022-12-05 DIAGNOSIS — Z6841 Body Mass Index (BMI) 40.0 and over, adult: Secondary | ICD-10-CM | POA: Diagnosis not present

## 2022-12-05 DIAGNOSIS — E119 Type 2 diabetes mellitus without complications: Secondary | ICD-10-CM | POA: Diagnosis not present

## 2022-12-05 HISTORY — PX: COLONOSCOPY WITH PROPOFOL: SHX5780

## 2022-12-05 HISTORY — PX: POLYPECTOMY: SHX5525

## 2022-12-05 LAB — GLUCOSE, CAPILLARY: Glucose-Capillary: 144 mg/dL — ABNORMAL HIGH (ref 70–99)

## 2022-12-05 SURGERY — COLONOSCOPY WITH PROPOFOL
Anesthesia: Monitor Anesthesia Care

## 2022-12-05 MED ORDER — ACETAMINOPHEN 500 MG PO TABS
1000.0000 mg | ORAL_TABLET | Freq: Once | ORAL | Status: AC
  Administered 2022-12-05: 1000 mg via ORAL

## 2022-12-05 MED ORDER — PROPOFOL 10 MG/ML IV BOLUS
INTRAVENOUS | Status: DC | PRN
  Administered 2022-12-05: 20 mg via INTRAVENOUS
  Administered 2022-12-05: 30 mg via INTRAVENOUS

## 2022-12-05 MED ORDER — SODIUM CHLORIDE 0.9 % IV SOLN: INTRAVENOUS | Status: DC

## 2022-12-05 MED ORDER — LACTATED RINGERS IV SOLN: INTRAVENOUS | Status: DC

## 2022-12-05 MED ORDER — LIDOCAINE 2% (20 MG/ML) 5 ML SYRINGE
INTRAMUSCULAR | Status: DC | PRN
  Administered 2022-12-05: 50 mg via INTRAVENOUS

## 2022-12-05 MED ORDER — PHENYLEPHRINE 80 MCG/ML (10ML) SYRINGE FOR IV PUSH (FOR BLOOD PRESSURE SUPPORT)
PREFILLED_SYRINGE | INTRAVENOUS | Status: DC | PRN
  Administered 2022-12-05: 160 ug via INTRAVENOUS

## 2022-12-05 MED ORDER — LACTATED RINGERS IV SOLN: INTRAVENOUS | Status: DC | PRN

## 2022-12-05 MED ORDER — ACETAMINOPHEN 500 MG PO TABS
ORAL_TABLET | ORAL | Status: AC
  Filled 2022-12-05: qty 2

## 2022-12-05 MED ORDER — PROPOFOL 500 MG/50ML IV EMUL
INTRAVENOUS | Status: DC | PRN
  Administered 2022-12-05: 100 ug/kg/min via INTRAVENOUS

## 2022-12-05 SURGICAL SUPPLY — 22 items

## 2022-12-05 NOTE — Op Note (Signed)
[facility] Patient Name: Denise Case Procedure Date: 12/05/2022 MRN: [id number] Attending MD: Carol Ada , MD, [id number] Date of Birth: [birth year] CSN: [id number] Age: 67 Admit Type: Outpatient Procedure:                Colonoscopy Indications:              Screening for colorectal malignant neoplasm Providers:                Carol Ada, MD, Helane Rima, RN, Carlyn Reichert, RN, William Dalton, Technician Referring MD:              Medicines:                Propofol per Anesthesia Complications:            No immediate complications. Estimated Blood Loss:     Estimated blood loss: none. Procedure:                Pre-Anesthesia Assessment:                           - Prior to the procedure, a History and Physical                            was performed, and patient medications and                            allergies were reviewed. The patient's tolerance of                            previous anesthesia was also reviewed. The risks                            and benefits of the procedure and the sedation                            options and risks were discussed with the patient.                            All questions were answered, and informed consent                            was obtained. Prior Anticoagulants: The patient has                            taken no anticoagulant or antiplatelet agents. ASA                            Grade Assessment: III - A patient with severe                            systemic disease. After reviewing the risks and  benefits, the patient was deemed in satisfactory                            condition to undergo the procedure.                           - Sedation was administered by an anesthesia                            professional. Deep sedation was attained.                           After obtaining informed consent, the colonoscope                             was passed under direct vision. Throughout the                            procedure, the patient's blood pressure, pulse, and                            oxygen saturations were monitored continuously. The                            CF-HQ190L ([id number]) Olympus colonoscope was                            introduced through the anus and advanced to the the                            cecum, identified by appendiceal orifice and                            ileocecal valve. The colonoscopy was performed with                            difficulty due to poor bowel prep. The patient                            tolerated the procedure well. The quality of the                            bowel preparation was evaluated using the BBPS                            [facility] Bowel Preparation Scale) with scores of:                            Right Colon = 1 (portion of mucosa seen, but other                            areas not well seen due to staining, residual stool  and/or opaque liquid), Transverse Colon = 1                            (portion of mucosa seen, but other areas not well                            seen due to staining, residual stool and/or opaque                            liquid) and Left Colon = 1 (portion of mucosa seen,                            but other areas not well seen due to staining,                            residual stool and/or opaque liquid). The total                            BBPS score equals 3. The quality of the bowel                            preparation was inadequate. The ileocecal valve,                            appendiceal orifice, and rectum were photographed. Scope In: 8:29:32 AM Scope Out: 8:40:27 AM Scope Withdrawal Time: 0 hours 2 minutes 40 seconds  Total Procedure Duration: 0 hours 10 minutes 55 seconds  Findings:      Two sessile polyps were found in the rectum and ascending colon. The       polyps were 3 to 10 mm in size.  These polyps were removed with a cold       snare. Resection and retrieval were complete.      A diffuse area of moderate melanosis was found in the entire colon.      The prep was poor. There was a significant amount of vegetable matter       that was not able to be cleared. Impression:               - Preparation of the colon was inadequate.                           - Two 3 to 10 mm polyps in the rectum and in the                            ascending colon, removed with a cold snare.                            Resected and retrieved.                           - Melanosis in the colon. Moderate Sedation:      Not Applicable - Patient had care per Anesthesia. Recommendation:           - Patient has a  contact number available for                            emergencies. The signs and symptoms of potential                            delayed complications were discussed with the                            patient. Return to normal activities tomorrow.                            Written discharge instructions were provided to the                            patient.                           - Resume previous diet.                           - Continue present medications.                           - Await pathology results.                           - Repeat colonoscopy within 3-6 months because the                            bowel preparation was poor. Two day prep. Procedure Code(s):        --- Professional ---                           [phone], Colonoscopy, flexible; with removal of                            tumor(s), polyp(s), or other lesion(s) by snare                            technique Diagnosis Code(s):        --- Professional ---                           Z12.11, Encounter for screening for malignant                            neoplasm of colon                           D12.8, Benign neoplasm of rectum                           D12.2, Benign neoplasm of ascending colon                            K63.89, Other specified diseases of intestine  CPT copyright 2022 American Medical Association. All rights reserved. The codes documented in this report are preliminary and upon coder review may  be revised to meet current compliance requirements. Carol Ada, MD Carol Ada, MD 12/05/2022 8:50:36 AM This report has been signed electronically. Number of Addenda: 0

## 2022-12-05 NOTE — Anesthesia Postprocedure Evaluation (Signed)
Anesthesia Post Note  Patient: EMERA BUSSIE  Procedure(s) Performed: COLONOSCOPY WITH PROPOFOL POLYPECTOMY     Patient location during evaluation: Endoscopy Anesthesia Type: MAC Level of consciousness: oriented, awake and alert and awake Pain management: pain level controlled Vital Signs Assessment: post-procedure vital signs reviewed and stable Respiratory status: spontaneous breathing, nonlabored ventilation, respiratory function stable and patient connected to nasal cannula oxygen Cardiovascular status: blood pressure returned to baseline and stable Postop Assessment: no headache, no backache and no apparent nausea or vomiting Anesthetic complications: no   No notable events documented.  Last Vitals:  Vitals:   12/05/22 0900 12/05/22 0910  BP: 135/71 116/63  Pulse: 88 87  Resp: 19 17  Temp:    SpO2: 93% 93%    Last Pain:  Vitals:   12/05/22 0910  TempSrc:   PainSc: [place]

## 2022-12-05 NOTE — Discharge Instructions (Signed)

## 2022-12-05 NOTE — H&P (Signed)
Denise Case HPI:  This 67 year old white female presents to the office for a 1 year follow up. She is taking Pantoprazole for acid reflux with good control. She is also taking Ondansetron 8 mg for occasional nausea.  She has 3-4 BM's per day with no obvious blood or mucus in the stool. She has good appetite and her weight is stable. She denies any complaints of abdominal pain, nausea, vomiting, dysphagia or odynophagia. She denies having a family history of colon cancer, celiac sprue, or IBD. Her last colonoscopy was done on 04/05/2015 which revealed internal hemorrhoids, a tubular adenoma and hyperplastic polyp were removed from the sigmoid colon and a sessile serrated adenoma was removed from the transverse colon. She was diagnosed with fatty liver in 2003 when an abdominal ultrasound was done.   Past Medical History:  Diagnosis Date   Anxiety    Asthma    Constipation due to pain medication    COPD (chronic obstructive pulmonary disease) (HCC)    Depression    Dermatitis    Diabetes mellitus without complication ([place])    Diverticulitis    Fatty liver 12/02/2014   Fibromyalgia    GERD (gastroesophageal reflux disease)    History of kidney stones    History of panic attacks    HTN (hypertension) 06/14/2019   Hyperlipidemia    Hypothyroidism    Lumbar sprain    Migraine    Morbid obesity (HCC)    Neuropathy    feet and legs   Opiate dependence ([place]) 12/03/2014   Pneumonia 2015   Requires supplemental oxygen    PRN uses 1.5 Liters   SBO (small bowel obstruction) ([place]) 12/01/2014   Thyroid disease    Tingling sensation    hands    Past Surgical History:  Procedure Laterality Date   ABDOMINAL HYSTERECTOMY     bladder tack     X 2   CHOLECYSTECTOMY N/A 05/25/2014   Procedure: LAPAROSCOPIC CHOLECYSTECTOMY WITH INTRAOPERATIVE CHOLANGIOGRAM;  Surgeon: Odis Hollingshead, MD;  Location: [facility] ORS;  Service: General;  Laterality: N/A;   COLONOSCOPY WITH PROPOFOL N/A 04/05/2015   Procedure:  COLONOSCOPY WITH PROPOFOL;  Surgeon: Juanita Craver, MD;  Location: [facility] ENDOSCOPY;  Service: Endoscopy;  Laterality: N/A;    Family History  Problem Relation Age of Onset   Diabetes Mother     Social History:  reports that she has been smoking cigarettes. She has a 37.00 pack-year smoking history. She has never used smokeless tobacco. She reports that she does not drink alcohol and does not use drugs.  Allergies:  Allergies  Allergen Reactions   Montelukast Sodium Anaphylaxis and Swelling   Doxycycline Other (See Comments)    Strips lining off of top of tongue, like thrush   Hydrocodone-Acetaminophen Other (See Comments)    Migraines (high doses)    Medications: Scheduled: Continuous:  sodium chloride     lactated ringers 10 mL/hr at 12/05/22 3235    Results for orders placed or performed during the hospital encounter of 12/05/22 (from the past 24 hour(s))  Glucose, capillary     Status: Abnormal   Collection Time: 12/05/22  8:05 AM  Result Value Ref Range   Glucose-Capillary 144 (H) 70 - 99 mg/dL     No results found.  ROS:  As stated above in the HPI otherwise negative.  Blood pressure (!) 166/78, pulse 96, temperature (!) 97.4 F (36.3 C), temperature source Temporal, resp. rate 16, height '5\' 6"'$  (1.676 m), weight 136.1  kg, SpO2 94 %.    PE: Gen: NAD, Alert and Oriented HEENT:  [place]/AT, EOMI Neck: Supple, no LAD Lungs: CTA Bilaterally CV: RRR without M/G/R ABD: Soft, NTND, +BS Ext: No C/C/E  Assessment/Plan: 1) Screening colonoscopy.  Denise Case D 12/05/2022, 8:20 AM

## 2022-12-05 NOTE — Anesthesia Procedure Notes (Signed)
Procedure Name: MAC Date/Time: 12/05/2022 8:20 AM  Performed by: Cynda Familia, CRNAPre-anesthesia Checklist: Patient identified, Emergency Drugs available, Suction available, Patient being monitored and Timeout performed Oxygen Delivery Method: Simple face mask Placement Confirmation: positive ETCO2 and breath sounds checked- equal and bilateral Dental Injury: Teeth and Oropharynx as per pre-operative assessment

## 2022-12-05 NOTE — Anesthesia Preprocedure Evaluation (Addendum)
Anesthesia Evaluation  Patient identified by MRN, date of birth, ID band Patient awake    Reviewed: Allergy & Precautions, NPO status , Patient's Chart, lab work & pertinent test results  Airway Mallampati: III  TM Distance: >3 FB Neck ROM: Full    Dental  (+) Dental Advisory Given, Edentulous Upper, Edentulous Lower   Pulmonary asthma , COPD,  oxygen dependent, Current Smoker and Patient abstained from smoking.   Pulmonary exam normal breath sounds clear to auscultation       Cardiovascular hypertension, Normal cardiovascular exam Rhythm:Regular Rate:Normal     Neuro/Psych  Headaches PSYCHIATRIC DISORDERS Anxiety Depression       GI/Hepatic Neg liver ROS,GERD  Medicated,,  Endo/Other  diabetes, Type 2, Oral Hypoglycemic AgentsHypothyroidism  Morbid obesity  Renal/GU negative Renal ROS     Musculoskeletal  (+)  Fibromyalgia -  Abdominal   Peds  Hematology negative hematology ROS (+)   Anesthesia Other Findings Day of surgery medications reviewed with the patient.  Reproductive/Obstetrics                             Anesthesia Physical Anesthesia Plan  ASA: 4  Anesthesia Plan: MAC   Post-op Pain Management:    Induction: Intravenous  PONV Risk Score and Plan: 1 and TIVA and Treatment may vary due to age or medical condition  Airway Management Planned: Nasal Cannula and Natural Airway  Additional Equipment:   Intra-op Plan:   Post-operative Plan:   Informed Consent: I have reviewed the patients History and Physical, chart, labs and discussed the procedure including the risks, benefits and alternatives for the proposed anesthesia with the patient or authorized representative who has indicated his/her understanding and acceptance.     Dental advisory given  Plan Discussed with: CRNA and Anesthesiologist  Anesthesia Plan Comments:        Anesthesia Quick Evaluation

## 2022-12-05 NOTE — Transfer of Care (Signed)
Immediate Anesthesia Transfer of Care Note  Patient: Denise Case  Procedure(s) Performed: COLONOSCOPY WITH PROPOFOL  Patient Location: PACU and Endoscopy Unit  Anesthesia Type:MAC  Level of Consciousness: awake  Airway & Oxygen Therapy: Patient Spontanous Breathing and Patient connected to face mask oxygen  Post-op Assessment: Report given to RN and Post -op Vital signs reviewed and stable  Post vital signs: Reviewed and stable  Last Vitals:  Vitals Value Taken Time  BP 118/58 12/05/22 0850  Temp 36.5 C 12/05/22 0849  Pulse 91 12/05/22 0851  Resp 21 12/05/22 0851  SpO2 91 % 12/05/22 0851  Vitals shown include unvalidated device data.  Last Pain:  Vitals:   12/05/22 0849  TempSrc: Temporal  PainSc: 0-No pain      Patients Stated Pain Goal: 3 (07/15/47 1856)  Complications: No notable events documented.

## 2022-12-08 ENCOUNTER — Encounter (HOSPITAL_COMMUNITY): Payer: Self-pay | Admitting: Gastroenterology

## 2022-12-08 LAB — SURGICAL PATHOLOGY

## 2023-04-06 ENCOUNTER — Other Ambulatory Visit: Payer: Self-pay | Admitting: Family Medicine

## 2023-04-06 DIAGNOSIS — Z13820 Encounter for screening for osteoporosis: Secondary | ICD-10-CM

## 2023-05-12 ENCOUNTER — Encounter: Payer: Self-pay | Admitting: *Deleted

## 2023-05-12 ENCOUNTER — Emergency Department: Payer: 59

## 2023-05-12 ENCOUNTER — Other Ambulatory Visit: Payer: Self-pay

## 2023-05-12 ENCOUNTER — Inpatient Hospital Stay
Admission: EM | Admit: 2023-05-12 | Discharge: 2023-05-15 | DRG: 327 | Disposition: A | Discharge status: Home Health | Payer: 59 | Attending: Internal Medicine | Admitting: Internal Medicine

## 2023-05-12 DIAGNOSIS — E119 Type 2 diabetes mellitus without complications: Principal | ICD-10-CM

## 2023-05-12 DIAGNOSIS — Z79890 Hormone replacement therapy (postmenopausal): Secondary | ICD-10-CM

## 2023-05-12 DIAGNOSIS — K219 Gastro-esophageal reflux disease without esophagitis: Secondary | ICD-10-CM | POA: Diagnosis present

## 2023-05-12 DIAGNOSIS — F17210 Nicotine dependence, cigarettes, uncomplicated: Secondary | ICD-10-CM | POA: Diagnosis present

## 2023-05-12 DIAGNOSIS — K56609 Unspecified intestinal obstruction, unspecified as to partial versus complete obstruction: Secondary | ICD-10-CM

## 2023-05-12 DIAGNOSIS — Z881 Allergy status to other antibiotic agents status: Secondary | ICD-10-CM

## 2023-05-12 DIAGNOSIS — E039 Hypothyroidism, unspecified: Secondary | ICD-10-CM | POA: Diagnosis present

## 2023-05-12 DIAGNOSIS — Z885 Allergy status to narcotic agent status: Secondary | ICD-10-CM

## 2023-05-12 DIAGNOSIS — E785 Hyperlipidemia, unspecified: Secondary | ICD-10-CM | POA: Diagnosis present

## 2023-05-12 DIAGNOSIS — J4489 Other specified chronic obstructive pulmonary disease: Secondary | ICD-10-CM | POA: Diagnosis present

## 2023-05-12 DIAGNOSIS — E876 Hypokalemia: Secondary | ICD-10-CM | POA: Diagnosis present

## 2023-05-12 DIAGNOSIS — R1084 Generalized abdominal pain: Secondary | ICD-10-CM

## 2023-05-12 DIAGNOSIS — Z79899 Other long term (current) drug therapy: Secondary | ICD-10-CM

## 2023-05-12 DIAGNOSIS — M797 Fibromyalgia: Secondary | ICD-10-CM | POA: Diagnosis present

## 2023-05-12 DIAGNOSIS — K5651 Intestinal adhesions [bands], with partial obstruction: Principal | ICD-10-CM | POA: Diagnosis present

## 2023-05-12 DIAGNOSIS — Z833 Family history of diabetes mellitus: Secondary | ICD-10-CM

## 2023-05-12 DIAGNOSIS — Z87442 Personal history of urinary calculi: Secondary | ICD-10-CM

## 2023-05-12 DIAGNOSIS — K760 Fatty (change of) liver, not elsewhere classified: Secondary | ICD-10-CM | POA: Diagnosis present

## 2023-05-12 DIAGNOSIS — R109 Unspecified abdominal pain: Secondary | ICD-10-CM | POA: Diagnosis not present

## 2023-05-12 DIAGNOSIS — Z8719 Personal history of other diseases of the digestive system: Secondary | ICD-10-CM

## 2023-05-12 DIAGNOSIS — Z6841 Body Mass Index (BMI) 40.0 and over, adult: Secondary | ICD-10-CM

## 2023-05-12 DIAGNOSIS — Z7984 Long term (current) use of oral hypoglycemic drugs: Secondary | ICD-10-CM

## 2023-05-12 DIAGNOSIS — I10 Essential (primary) hypertension: Secondary | ICD-10-CM | POA: Diagnosis present

## 2023-05-12 DIAGNOSIS — Z9049 Acquired absence of other specified parts of digestive tract: Secondary | ICD-10-CM

## 2023-05-12 DIAGNOSIS — E1140 Type 2 diabetes mellitus with diabetic neuropathy, unspecified: Secondary | ICD-10-CM | POA: Diagnosis present

## 2023-05-12 DIAGNOSIS — R062 Wheezing: Secondary | ICD-10-CM | POA: Diagnosis present

## 2023-05-12 LAB — COMPREHENSIVE METABOLIC PANEL
ALT: 32 U/L (ref 0–44)
AST: 46 U/L — ABNORMAL HIGH (ref 15–41)
Albumin: 3.9 g/dL (ref 3.5–5.0)
Alkaline Phosphatase: 65 U/L (ref 38–126)
Anion gap: 12 (ref 5–15)
BUN: 17 mg/dL (ref 8–23)
CO2: 27 mmol/L (ref 22–32)
Calcium: 9.8 mg/dL (ref 8.9–10.3)
Chloride: 101 mmol/L (ref 98–111)
Creatinine, Ser: 0.76 mg/dL (ref 0.44–1.00)
GFR, Estimated: >60 mL/min (ref >60)
Glucose, Bld: 192 mg/dL — ABNORMAL HIGH (ref 70–99)
Potassium: 3.8 mmol/L (ref 3.5–5.1)
Sodium: 140 mmol/L (ref 135–145)
Total Bilirubin: 0.4 mg/dL (ref 0.3–1.2)
Total Protein: 8.1 g/dL (ref 6.5–8.1)

## 2023-05-12 LAB — CBC
HCT: 43.6 % (ref 36.0–46.0)
Hemoglobin: 14.3 g/dL (ref 12.0–15.0)
MCH: 30.7 pg (ref 26.0–34.0)
MCHC: 32.8 g/dL (ref 30.0–36.0)
MCV: 93.6 fL (ref 80.0–100.0)
Platelets: 299 10*3/uL (ref 150–400)
RBC: 4.66 MIL/uL (ref 3.87–5.11)
RDW: 13.6 % (ref 11.5–15.5)
WBC: 14.2 10*3/uL — ABNORMAL HIGH (ref 4.0–10.5)
nRBC: 0 % (ref 0.0–0.2)

## 2023-05-12 LAB — GLUCOSE, CAPILLARY
Glucose-Capillary: 118 mg/dL — ABNORMAL HIGH (ref 70–99)
Glucose-Capillary: 155 mg/dL — ABNORMAL HIGH (ref 70–99)

## 2023-05-12 LAB — LIPASE, BLOOD: Lipase: 40 U/L (ref 11–51)

## 2023-05-12 MED ORDER — INSULIN ASPART 100 UNIT/ML IJ SOLN
0.0000 [IU] | Freq: Three times a day (TID) | INTRAMUSCULAR | Status: DC
  Administered 2023-05-15: 1 [IU] via SUBCUTANEOUS
  Filled 2023-05-12: qty 1

## 2023-05-12 MED ORDER — ONDANSETRON HCL 4 MG/2ML IJ SOLN
4.0000 mg | Freq: Once | INTRAMUSCULAR | Status: AC
  Administered 2023-05-12: 4 mg via INTRAVENOUS
  Filled 2023-05-12: qty 2

## 2023-05-12 MED ORDER — HEPARIN SODIUM (PORCINE) 5000 UNIT/ML IJ SOLN
5000.0000 [IU] | Freq: Three times a day (TID) | INTRAMUSCULAR | Status: DC
  Administered 2023-05-12 – 2023-05-15 (×8): 5000 [IU] via SUBCUTANEOUS
  Filled 2023-05-12 (×8): qty 1

## 2023-05-12 MED ORDER — MORPHINE SULFATE (PF) 2 MG/ML IV SOLN
2.0000 mg | INTRAVENOUS | Status: DC | PRN
  Administered 2023-05-12 – 2023-05-13 (×3): 2 mg via INTRAVENOUS
  Filled 2023-05-12 (×3): qty 1

## 2023-05-12 MED ORDER — LACTATED RINGERS IV SOLN: INTRAVENOUS | Status: DC

## 2023-05-12 MED ORDER — OXYMETAZOLINE HCL 0.05 % NA SOLN
1.0000 | Freq: Once | NASAL | Status: AC
  Administered 2023-05-12: 1 via NASAL
  Filled 2023-05-12: qty 30

## 2023-05-12 MED ORDER — IOHEXOL 300 MG/ML  SOLN
100.0000 mL | Freq: Once | INTRAMUSCULAR | Status: AC | PRN
  Administered 2023-05-12: 100 mL via INTRAVENOUS

## 2023-05-12 MED ORDER — MORPHINE SULFATE (PF) 4 MG/ML IV SOLN
4.0000 mg | Freq: Once | INTRAVENOUS | Status: AC
  Administered 2023-05-12: 4 mg via INTRAVENOUS
  Filled 2023-05-12: qty 1

## 2023-05-12 MED ORDER — SODIUM CHLORIDE 0.9% FLUSH
3.0000 mL | Freq: Two times a day (BID) | INTRAVENOUS | Status: DC
  Administered 2023-05-12 – 2023-05-15 (×6): 3 mL via INTRAVENOUS

## 2023-05-12 MED ORDER — PANTOPRAZOLE SODIUM 40 MG IV SOLR
40.0000 mg | Freq: Two times a day (BID) | INTRAVENOUS | Status: DC
  Administered 2023-05-12 – 2023-05-15 (×6): 40 mg via INTRAVENOUS
  Filled 2023-05-12 (×6): qty 10

## 2023-05-12 MED ORDER — DIATRIZOATE MEGLUMINE & SODIUM 66-10 % PO SOLN
90.0000 mL | Freq: Once | ORAL | Status: AC
  Administered 2023-05-12: 90 mL via NASOGASTRIC

## 2023-05-12 MED ORDER — SODIUM CHLORIDE 0.9 % IV BOLUS
1000.0000 mL | Freq: Once | INTRAVENOUS | Status: AC
  Administered 2023-05-12: 1000 mL via INTRAVENOUS

## 2023-05-12 MED ORDER — LIDOCAINE HCL URETHRAL/MUCOSAL 2 % EX GEL
1.0000 | Freq: Once | CUTANEOUS | Status: AC
  Administered 2023-05-12: 1
  Filled 2023-05-12: qty 6

## 2023-05-12 MED ORDER — BENZOCAINE 20 % MT SOLN
Freq: Once | OROMUCOSAL | Status: AC
  Administered 2023-05-12: 1 via OROMUCOSAL
  Filled 2023-05-12 (×2): qty 10

## 2023-05-12 NOTE — ED Notes (Signed)
IV meds given for pain and nausea  Family at bedside

## 2023-05-12 NOTE — Assessment & Plan Note (Signed)
-  NPO hold levothyroxine for 1-2 days.  - will change to IV if pt needs to be NPO for longer than 24-48 hours.

## 2023-05-12 NOTE — ED Notes (Signed)
Xray called for confirmation of tube.

## 2023-05-12 NOTE — Assessment & Plan Note (Signed)
-  pt is not SOB. -o2 sats are wnl. - PRN albuterol.

## 2023-05-12 NOTE — ED Triage Notes (Addendum)
Pt brought in via ems from home.  Pt has abd pain since yesterday.  Pt reports vomiting and diarrhea.  Denies urinary sx   pt reports cramping type pain in abdomen.  pt alert  speech clear.  Iv in place.

## 2023-05-12 NOTE — ED Notes (Signed)
See triage note  Presents with some abd pain with n/v/d  Afebrile on arrival

## 2023-05-12 NOTE — Consult Note (Signed)
Subjective:   CC: SBO  HPI:  Denise Case is a 67 y.o. female who was consulted by Ray for issue above.  Symptoms were first noted several days ago. N/V/D initially, then started developing abdominal pain. Pain is sharp, confined to the periumbilical area, without radiation.  Associated with nausea, exacerbated by nothing specific.  Last BM yesterday, no flatus or n/v today.     Past Medical History:  has a past medical history of Anxiety, Asthma, Constipation due to pain medication, COPD (chronic obstructive pulmonary disease) (HCC), Depression, Dermatitis, Diabetes mellitus without complication (HCC), Diverticulitis, Fatty liver (12/02/2014), Fibromyalgia, GERD (gastroesophageal reflux disease), History of kidney stones, History of panic attacks, HTN (hypertension) (06/14/2019), Hyperlipidemia, Hypothyroidism, Lumbar sprain, Migraine, Morbid obesity (HCC), Neuropathy, Opiate dependence (HCC) (12/03/2014), Pneumonia (2015), Requires supplemental oxygen, SBO (small bowel obstruction) (HCC) (12/01/2014), Thyroid disease, and Tingling sensation.  Past Surgical History:  Past Surgical History:  Procedure Laterality Date   ABDOMINAL HYSTERECTOMY     bladder tack     X 2   CHOLECYSTECTOMY N/A 05/25/2014   Procedure: LAPAROSCOPIC CHOLECYSTECTOMY WITH INTRAOPERATIVE CHOLANGIOGRAM;  Surgeon: Adolph Pollack, MD;  Location: [facility];  Service: General;  Laterality: N/A;   COLONOSCOPY WITH PROPOFOL N/A 04/05/2015   Procedure: COLONOSCOPY WITH PROPOFOL;  Surgeon: Charna Elizabeth, MD;  Location: [facility];  Service: Endoscopy;  Laterality: N/A;   COLONOSCOPY WITH PROPOFOL N/A 12/05/2022   Procedure: COLONOSCOPY WITH PROPOFOL;  Surgeon: Jeani Hawking, MD;  Location: [facility];  Service: Gastroenterology;  Laterality: N/A;   POLYPECTOMY  12/05/2022   Procedure: POLYPECTOMY;  Surgeon: Jeani Hawking, MD;  Location: [facility];  Service: Gastroenterology;;    Family History: family history includes Diabetes in  her mother.  Social History:  reports that she has been smoking cigarettes. She has a 37.00 pack-year smoking history. She has never used smokeless tobacco. She reports that she does not drink alcohol and does not use drugs.  Current Medications:  Prior to Admission medications   Medication Sig Start Date End Date Taking? Authorizing Provider  acetaminophen (TYLENOL) 500 MG tablet Take 1,000 mg by mouth every 6 (six) hours as needed for moderate pain.    [provider]  albuterol (VENTOLIN HFA) 108 (90 Base) MCG/ACT inhaler Inhale 2 puffs into the lungs every 6 (six) hours as needed for wheezing or shortness of breath. 07/24/22   Elson Areas, PA-C  allopurinol (ZYLOPRIM) 300 MG tablet Take 300 mg by mouth daily. 01/28/19   [provider]  ALPRAZolam Prudy Feeler) 1 MG tablet Take 1 mg by mouth 3 (three) times daily.    [provider]  Ascorbic Acid (VITAMIN C) 1000 MG tablet Take 1,000 mg by mouth 3 (three) times daily.    [provider]  atorvastatin (LIPITOR) 10 MG tablet Take 10 mg by mouth daily.    [provider]  CINNAMON PO Take 2,000 mg by mouth daily.    [provider]  Cyanocobalamin (B-12 PO) Take 1 tablet by mouth daily.    [provider]  cyclobenzaprine (FLEXERIL) 10 MG tablet Take 10 mg by mouth 3 (three) times daily.    [provider]  ergocalciferol (VITAMIN D2) 50000 UNITS capsule Take 50,000 Units by mouth every Friday.    [provider]  fish oil-omega-3 fatty acids 1000 MG capsule Take 1 g by mouth 2 (two) times daily.    [provider]  FLUoxetine (PROZAC) 20 MG capsule Take 20 mg by mouth daily.  [provider]  furosemide (LASIX) 20 MG tablet Take 50 mg by mouth daily.    [provider]  gabapentin (NEURONTIN) 800 MG tablet Take 800 mg by mouth 3 (three) times daily.     [provider]  glipiZIDE (GLUCOTROL) 5 MG tablet Take 5 mg by mouth daily  before breakfast.    [provider]  levothyroxine (SYNTHROID, LEVOTHROID) 75 MCG tablet Take 3 tablets (225 mcg total) by mouth daily before breakfast. 12/06/14   Houston Siren, MD  Melatonin 10 MG CAPS Take 20 mg by mouth at bedtime.    [provider]  Menthol, Topical Analgesic, (MINERAL ICE EX) Apply 1 Application topically daily as needed (pain).    [provider]  Multiple Vitamins-Minerals (MULTIVITAMIN GUMMIES ADULT PO) Take 1 each by mouth daily.    [provider]  ondansetron (ZOFRAN-ODT) 8 MG disintegrating tablet Take 8 mg by mouth every 8 (eight) hours as needed for vomiting or nausea. 07/16/21   [provider]  OVER THE COUNTER MEDICATION Take 1 tablet by mouth 2 (two) times daily. Striction D supplement    [provider]  OVER THE COUNTER MEDICATION Take 2 capsules by mouth daily. Natural Cleanser otc supplement    [provider]  pantoprazole (PROTONIX) 40 MG tablet Take 40 mg by mouth daily. 01/25/19   [provider]  pseudoephedrine-acetaminophen (TYLENOL SINUS) 30-500 MG TABS Take 2 tablets by mouth as needed (congestion).    [provider]  Semaglutide, 1 MG/DOSE, (OZEMPIC, 1 MG/DOSE,) 2 MG/1.5ML SOPN Inject 1 mg into the skin every Sunday.    [provider]  theophylline (THEO-24) 300 MG 24 hr capsule Take 300 mg by mouth daily.    [provider]  traMADol (ULTRAM) 50 MG tablet Take 50 mg by mouth in the morning, at noon, and at bedtime.    [provider]  TURMERIC PO Take 1 tablet by mouth daily.    [provider]    Allergies:  Allergies as of 05/12/2023 - Review Complete 05/12/2023  Allergen Reaction Noted   Montelukast sodium Anaphylaxis and Swelling 03/13/2011   Doxycycline Other (See Comments) 04/19/2014   Hydrocodone-acetaminophen Other (See Comments) 08/20/2012    ROS:  General: Denies weight loss, weight gain, fatigue, fevers, chills, and  night sweats. Eyes: Denies blurry vision, double vision, eye pain, itchy eyes, and tearing. Ears: Denies hearing loss, earache, and ringing in ears. Nose: Denies sinus pain, congestion, infections, runny nose, and nosebleeds. Mouth/throat: Denies hoarseness, sore throat, bleeding gums, and difficulty swallowing. Heart: Denies chest pain, palpitations, racing heart, irregular heartbeat, leg pain or swelling, and decreased activity tolerance. Respiratory: Denies breathing difficulty, shortness of breath, wheezing, cough, and sputum. GI: Denies change in appetite, heartburn, and blood in stool. GU: Denies difficulty urinating, pain with urinating, urgency, frequency, blood in urine. Musculoskeletal: Denies joint stiffness, pain, swelling, muscle weakness. Skin: Denies rash, itching, mass, tumors, sores, and boils Neurologic: Denies headache, fainting, dizziness, seizures, numbness, and tingling. Psychiatric: Denies depression, anxiety, difficulty sleeping, and memory loss. Endocrine: Denies heat or cold intolerance, and increased thirst or urination. Blood/lymph: Denies easy bruising, easy bruising, and swollen glands     Objective:     BP 101/68 (BP Location: Right Arm)   Pulse 90   Temp 98 F (36.7 C) (Oral)   Resp 20   Ht 5\' 4"  (1.626 m)   Wt 132.9 kg   SpO2 91%   BMI 50.29 kg/m   Constitutional :  alert, cooperative, appears stated age, and no distress  Lymphatics/Throat:  no asymmetry, masses, or scars  Respiratory:  clear to auscultation bilaterally  Cardiovascular:  regular rate and rhythm  Gastrointestinal: Soft, no guarding, TTP periumbilical area .   Musculoskeletal: Steady movement  Skin: Cool and moist   Psychiatric: Normal affect, non-agitated, not confused       LABS:     Latest Ref Rng & Units 05/12/2023    3:11 PM 07/24/2022    9:13 AM 02/05/2019    5:09 PM  CMP  Glucose 70 - 99 mg/dL 440  102  725   BUN 8 - 23 mg/dL 17  8  13    Creatinine 0.44 - 1.00 mg/dL  3.66  4.40  3.47   Sodium 135 - 145 mmol/L 140  137  138   Potassium 3.5 - 5.1 mmol/L 3.8  3.6  3.8   Chloride 98 - 111 mmol/L 101  104  101   CO2 22 - 32 mmol/L 27  23  25    Calcium 8.9 - 10.3 mg/dL 9.8  9.0  9.1   Total Protein 6.5 - 8.1 g/dL 8.1     Total Bilirubin 0.3 - 1.2 mg/dL 0.4     Alkaline Phos 38 - 126 U/L 65     AST 15 - 41 U/L 46     ALT 0 - 44 U/L 32         Latest Ref Rng & Units 05/12/2023    3:11 PM 07/24/2022    9:13 AM 02/05/2019    5:09 PM  CBC  WBC 4.0 - 10.5 K/uL 14.2  9.3  19.6   Hemoglobin 12.0 - 15.0 g/dL 42.5  95.6  38.7   Hematocrit 36.0 - 46.0 % 43.6  42.1  41.7   Platelets 150 - 400 K/uL 299  209  302     RADS: CLINICAL DATA:  Upper abdominal pain.   EXAM: CT ABDOMEN AND PELVIS WITH CONTRAST   TECHNIQUE: Multidetector CT imaging of the abdomen and pelvis was performed using the standard protocol following bolus administration of intravenous contrast.   RADIATION DOSE REDUCTION: This exam was performed according to the departmental dose-optimization program which includes automated exposure control, adjustment of the mA and/or kV according to patient size and/or use of iterative reconstruction technique.   CONTRAST:  OMNIPAQUE IOHEXOL 300 MG/ML  SOLN   COMPARISON:  December 01, 2014   FINDINGS: Lower chest: No acute abnormality.   Hepatobiliary: No focal liver abnormality is seen. Status post cholecystectomy. No biliary dilatation.   Pancreas: Unremarkable. No pancreatic ductal dilatation or surrounding inflammatory changes.   Spleen: Subcentimeter calcified granulomas are seen within the spleen.   Adrenals/Urinary Tract: Adrenal glands are unremarkable. Kidneys are normal, without renal calculi, focal lesion, or hydronephrosis. A small amount of air is seen within the lumen of a moderately distended urinary bladder.   Stomach/Bowel: Stomach is within normal limits. Appendix appears normal. Multiple dilated small bowel loops  are seen throughout the abdomen and pelvis (maximum small bowel diameter of approximately 3.2 cm). A transition zone is seen along the midline of the posterior pelvis (axial CT images 68 through 77, CT series 2).   Vascular/Lymphatic: Aortic atherosclerosis. No enlarged abdominal or pelvic lymph nodes.   Reproductive: Status post hysterectomy. No adnexal masses.   Other: No abdominal wall hernia or abnormality. No abdominopelvic ascites.   Musculoskeletal: Multilevel degenerative changes seen throughout the thoracolumbar spine.   IMPRESSION: 1. Small-bowel obstruction  with a transition zone along the midline of the posterior pelvis. 2. Evidence of prior cholecystectomy and prior hysterectomy. 3. Aortic atherosclerosis.   Aortic Atherosclerosis (ICD10-I70.0).     Electronically Signed   By: Aram Candela M.D.   On: 05/12/2023 17:21 Assessment:   sbo  Plan:   N/v/d preceded abdominal pain, CT reviewed above.  Hopefully will resolve after NG decompression, and avoid high risk surgery due to her comorbidities.  SBFT ordered.  Monitoring of chronic disease per hospitalist team. Will continue serial abdominal exam.    labs/images/medications/previous chart entries reviewed personally and relevant changes/updates noted above.

## 2023-05-12 NOTE — Assessment & Plan Note (Signed)
Pt is on ozempic. We will start glycemic protocol.

## 2023-05-12 NOTE — Assessment & Plan Note (Signed)
Vitals and BP are stable. We will monitor and USE PRN IV hydralazine

## 2023-05-12 NOTE — ED Triage Notes (Signed)
Arrives via [facility].   C/O abd pain since this morning.  C/O N/V/D x 2 days.  VS wnl.  4 mg zofran. 500 ml NS and 50 mcg fentanyl.   20 g right hand.

## 2023-05-12 NOTE — H&P (Addendum)
History and Physical    Patient: Denise Case Denise Case:[id number] DOB: 30-Mar-1956 DOA: 05/12/2023 DOS: the patient was seen and examined on 05/12/2023 PCP: Irven Coe, MD  Patient coming from: Home  Chief Complaint:  Chief Complaint  Patient presents with   Abdominal Pain    HPI: Denise Case is a 67 y.o. female with medical history significant for Abdominal pain.  Patient reports That her symptoms started acutely this morning nausea vomiting and abdominal pain she has been doing well for the past few weeks few days except for single episode of diarrhea the day before. No chest pain no fevers no headache palpitations.  No bleeding.  No other signs or complaints.  Patient states that she is on a hysterectomy bladder surgeries cholecystectomy.  All of her pregnancies -Vaginal delivery.  Patient also has a medical history of diabetes mellitus type 2, asthma, diverticulitis, GERD, hypothyroidism history of small bowel obstruction in 2016. In ed pt is A/O afebrile / NGT + pt feels better after NGT. General surgery has been consulted.  Ct shows :Small-bowel obstruction with a transition zone along the midline of the posterior pelvis. Review of Systems: Review of Systems  Gastrointestinal:  Positive for abdominal pain, diarrhea, nausea and vomiting.  All other systems reviewed and are negative.  Past Medical History:  Diagnosis Date   Anxiety    Asthma    Constipation due to pain medication    COPD (chronic obstructive pulmonary disease) (HCC)    Depression    Dermatitis    Diabetes mellitus without complication (HCC)    Diverticulitis    Fatty liver 12/02/2014   Fibromyalgia    GERD (gastroesophageal reflux disease)    History of kidney stones    History of panic attacks    HTN (hypertension) 06/14/2019   Hyperlipidemia    Hypothyroidism    Lumbar sprain    Migraine    Morbid obesity (HCC)    Neuropathy    feet and legs   Opiate dependence (HCC) 12/03/2014   Pneumonia 2015    Requires supplemental oxygen    PRN uses 1.5 Liters   SBO (small bowel obstruction) (HCC) 12/01/2014   Thyroid disease    Tingling sensation    hands   Past Surgical History:  Procedure Laterality Date   ABDOMINAL HYSTERECTOMY     bladder tack     X 2   CHOLECYSTECTOMY N/A 05/25/2014   Procedure: LAPAROSCOPIC CHOLECYSTECTOMY WITH INTRAOPERATIVE CHOLANGIOGRAM;  Surgeon: Adolph Pollack, MD;  Location: [facility];  Service: General;  Laterality: N/A;   COLONOSCOPY WITH PROPOFOL N/A 04/05/2015   Procedure: COLONOSCOPY WITH PROPOFOL;  Surgeon: Charna Elizabeth, MD;  Location: [facility];  Service: Endoscopy;  Laterality: N/A;   COLONOSCOPY WITH PROPOFOL N/A 12/05/2022   Procedure: COLONOSCOPY WITH PROPOFOL;  Surgeon: Jeani Hawking, MD;  Location: [facility];  Service: Gastroenterology;  Laterality: N/A;   POLYPECTOMY  12/05/2022   Procedure: POLYPECTOMY;  Surgeon: Jeani Hawking, MD;  Location: [facility];  Service: Gastroenterology;;   Social History:  reports that she has been smoking cigarettes. She has a 37.00 pack-year smoking history. She has never used smokeless tobacco. She reports that she does not drink alcohol and does not use drugs.  Allergies  Allergen Reactions   Montelukast Sodium Anaphylaxis and Swelling   Doxycycline Other (See Comments)    Strips lining off of top of tongue, like thrush   Hydrocodone-Acetaminophen Other (See Comments)    Migraines (high doses)    Family History  Problem Relation Age of Onset   Diabetes Mother     Prior to Admission medications   Medication Sig Start Date End Date Taking? Authorizing Provider  acetaminophen (TYLENOL) 500 MG tablet Take 1,000 mg by mouth every 6 (six) hours as needed for moderate pain.    [provider]  albuterol (VENTOLIN HFA) 108 (90 Base) MCG/ACT inhaler Inhale 2 puffs into the lungs every 6 (six) hours as needed for wheezing or shortness of breath. 07/24/22   Elson Areas, PA-C  allopurinol (ZYLOPRIM) 300 MG  tablet Take 300 mg by mouth daily. 01/28/19   [provider]  ALPRAZolam Prudy Feeler) 1 MG tablet Take 1 mg by mouth 3 (three) times daily.    [provider]  Ascorbic Acid (VITAMIN C) 1000 MG tablet Take 1,000 mg by mouth 3 (three) times daily.    [provider]  atorvastatin (LIPITOR) 10 MG tablet Take 10 mg by mouth daily.    [provider]  CINNAMON PO Take 2,000 mg by mouth daily.    [provider]  Cyanocobalamin (B-12 PO) Take 1 tablet by mouth daily.    [provider]  cyclobenzaprine (FLEXERIL) 10 MG tablet Take 10 mg by mouth 3 (three) times daily.    [provider]  ergocalciferol (VITAMIN D2) 50000 UNITS capsule Take 50,000 Units by mouth every Friday.    [provider]  fish oil-omega-3 fatty acids 1000 MG capsule Take 1 g by mouth 2 (two) times daily.    [provider]  FLUoxetine (PROZAC) 20 MG capsule Take 20 mg by mouth daily.    [provider]  furosemide (LASIX) 20 MG tablet Take 50 mg by mouth daily.    [provider]  gabapentin (NEURONTIN) 800 MG tablet Take 800 mg by mouth 3 (three) times daily.     [provider]  glipiZIDE (GLUCOTROL) 5 MG tablet Take 5 mg by mouth daily before breakfast.    [provider]  levothyroxine (SYNTHROID, LEVOTHROID) 75 MCG tablet Take 3 tablets (225 mcg total) by mouth daily before breakfast. 12/06/14   Houston Siren, MD  Melatonin 10 MG CAPS Take 20 mg by mouth at bedtime.    [provider]  Menthol, Topical Analgesic, (MINERAL ICE EX) Apply 1 Application topically daily as needed (pain).    [provider]  Multiple Vitamins-Minerals (MULTIVITAMIN GUMMIES ADULT PO) Take 1 each by mouth daily.    [provider]  ondansetron (ZOFRAN-ODT) 8 MG disintegrating tablet Take 8 mg by mouth every 8 (eight) hours as needed for vomiting or nausea. 07/16/21   [provider]  OVER THE COUNTER MEDICATION  Take 1 tablet by mouth 2 (two) times daily. Striction D supplement    [provider]  OVER THE COUNTER MEDICATION Take 2 capsules by mouth daily. Natural Cleanser otc supplement    [provider]  pantoprazole (PROTONIX) 40 MG tablet Take 40 mg by mouth daily. 01/25/19   [provider]  pseudoephedrine-acetaminophen (TYLENOL SINUS) 30-500 MG TABS Take 2 tablets by mouth as needed (congestion).    [provider]  Semaglutide, 1 MG/DOSE, (OZEMPIC, 1 MG/DOSE,) 2 MG/1.5ML SOPN Inject 1 mg into the skin every Sunday.    [provider]  theophylline (THEO-24) 300 MG 24 hr capsule Take 300 mg by mouth daily.    [provider]  traMADol (ULTRAM) 50 MG tablet Take 50 mg by mouth in the morning, at noon, and at bedtime.  [provider]  TURMERIC PO Take 1 tablet by mouth daily.    [provider]     Vitals:   05/12/23 1507 05/12/23 1508 05/12/23 1509 05/12/23 1510  BP: 101/68     Pulse:   90   Resp: 20     Temp:    98 F (36.7 C)  TempSrc: Oral   Oral  SpO2:   91%   Weight:  132.9 kg    Height:  5\' 4"  (1.626 m)     Physical Exam Vitals and nursing note reviewed.  Constitutional:      General: She is not in acute distress.    Comments: NGT +  HENT:     Head: Normocephalic and atraumatic.     Right Ear: Hearing normal.     Left Ear: Hearing normal.     Nose: Nose normal. No nasal deformity.     Mouth/Throat:     Lips: Pink.     Tongue: No lesions.     Pharynx: Oropharynx is clear.  Eyes:     General: Lids are normal.     Extraocular Movements: Extraocular movements intact.  Cardiovascular:     Rate and Rhythm: Normal rate and regular rhythm.     Heart sounds: Normal heart sounds.  Pulmonary:     Effort: Pulmonary effort is normal.     Breath sounds: Examination of the right-middle field reveals wheezing. Examination of the left-middle field reveals wheezing. Examination of the right-lower field reveals  wheezing. Examination of the left-lower field reveals wheezing. Wheezing present.  Abdominal:     General: Bowel sounds are normal. There is distension.     Palpations: Abdomen is soft. There is no mass.     Tenderness: There is no abdominal tenderness. There is no guarding.     Hernia: No hernia is present.  Musculoskeletal:     Right lower leg: No edema.     Left lower leg: No edema.  Skin:    General: Skin is warm.  Neurological:     General: No focal deficit present.     Mental Status: She is alert and oriented to person, place, and time.     Cranial Nerves: Cranial nerves 2-12 are intact.  Psychiatric:        Attention and Perception: Attention normal.        Mood and Affect: Mood normal.        Speech: Speech normal.        Behavior: Behavior normal. Behavior is cooperative.      Labs on Admission: I have personally reviewed following labs and imaging studies  CBC: Recent Labs  Lab 05/12/23 1511  WBC 14.2*  HGB 14.3  HCT 43.6  MCV 93.6  PLT 299   Basic Metabolic Panel: Recent Labs  Lab 05/12/23 1511  NA 140  K 3.8  CL 101  CO2 27  GLUCOSE 192*  BUN 17  CREATININE 0.76  CALCIUM 9.8   GFR: Estimated Creatinine Clearance: 96.5 mL/min (by C-G formula based on SCr of 0.76 mg/dL). Liver Function Tests: Recent Labs  Lab 05/12/23 1511  AST 46*  ALT 32  ALKPHOS 65  BILITOT 0.4  PROT 8.1  ALBUMIN 3.9   Recent Labs  Lab 05/12/23 1511  LIPASE 40   No results for input(s): "AMMONIA" in the last 168 hours. Coagulation Profile: No results for input(s): "INR", "PROTIME" in the last 168 hours. Cardiac Enzymes: No results for input(s): "CKTOTAL", "CKMB", "CKMBINDEX", "TROPONINI" in  the last 168 hours. BNP (last 3 results) No results for input(s): "PROBNP" in the last 8760 hours. HbA1C: No results for input(s): "HGBA1C" in the last 72 hours. CBG: No results for input(s): "GLUCAP" in the last 168 hours. Lipid Profile: No results for input(s): "CHOL",  "HDL", "LDLCALC", "TRIG", "CHOLHDL", "LDLDIRECT" in the last 72 hours. Thyroid Function Tests: No results for input(s): "TSH", "T4TOTAL", "FREET4", "T3FREE", "THYROIDAB" in the last 72 hours. Anemia Panel: No results for input(s): "VITAMINB12", "FOLATE", "FERRITIN", "TIBC", "IRON", "RETICCTPCT" in the last 72 hours. Urine analysis:    Component Value Date/Time   COLORURINE YELLOW (A) 02/05/2019 1831   APPEARANCEUR HAZY (A) 02/05/2019 1831   LABSPEC 1.006 02/05/2019 1831   PHURINE 7.0 02/05/2019 1831   GLUCOSEU NEGATIVE 02/05/2019 1831   HGBUR NEGATIVE 02/05/2019 1831   BILIRUBINUR NEGATIVE 02/05/2019 1831   KETONESUR NEGATIVE 02/05/2019 1831   PROTEINUR NEGATIVE 02/05/2019 1831   UROBILINOGEN 1.0 12/03/2014 0320   NITRITE NEGATIVE 02/05/2019 1831   LEUKOCYTESUR MODERATE (A) 02/05/2019 1831    Radiological Exams on Admission: DG Abd Portable 1V-Small Bowel Protocol-Position Verification  Result Date: 05/12/2023 CLINICAL DATA:  Nasogastric tube placement. EXAM: PORTABLE ABDOMEN - 1 VIEW COMPARISON:  CT earlier today FINDINGS: Tip and side port of the enteric tube below the diaphragm in the stomach. Dilated small bowel on CT is fluid-filled and not well seen by radiograph. IMPRESSION: Tip and side port of the enteric tube below the diaphragm in the stomach. Electronically Signed   By: Narda Rutherford M.D.   On: 05/12/2023 19:56   CT ABDOMEN PELVIS W CONTRAST  Result Date: 05/12/2023 CLINICAL DATA:  Upper abdominal pain. EXAM: CT ABDOMEN AND PELVIS WITH CONTRAST TECHNIQUE: Multidetector CT imaging of the abdomen and pelvis was performed using the standard protocol following bolus administration of intravenous contrast. RADIATION DOSE REDUCTION: This exam was performed according to the departmental dose-optimization program which includes automated exposure control, adjustment of the mA and/or kV according to patient size and/or use of iterative reconstruction technique. CONTRAST:   OMNIPAQUE IOHEXOL 300 MG/ML  SOLN COMPARISON:  December 01, 2014 FINDINGS: Lower chest: No acute abnormality. Hepatobiliary: No focal liver abnormality is seen. Status post cholecystectomy. No biliary dilatation. Pancreas: Unremarkable. No pancreatic ductal dilatation or surrounding inflammatory changes. Spleen: Subcentimeter calcified granulomas are seen within the spleen. Adrenals/Urinary Tract: Adrenal glands are unremarkable. Kidneys are normal, without renal calculi, focal lesion, or hydronephrosis. A small amount of air is seen within the lumen of a moderately distended urinary bladder. Stomach/Bowel: Stomach is within normal limits. Appendix appears normal. Multiple dilated small bowel loops are seen throughout the abdomen and pelvis (maximum small bowel diameter of approximately 3.2 cm). A transition zone is seen along the midline of the posterior pelvis (axial CT images 68 through 77, CT series 2). Vascular/Lymphatic: Aortic atherosclerosis. No enlarged abdominal or pelvic lymph nodes. Reproductive: Status post hysterectomy. No adnexal masses. Other: No abdominal wall hernia or abnormality. No abdominopelvic ascites. Musculoskeletal: Multilevel degenerative changes seen throughout the thoracolumbar spine. IMPRESSION: 1. Small-bowel obstruction with a transition zone along the midline of the posterior pelvis. 2. Evidence of prior cholecystectomy and prior hysterectomy. 3. Aortic atherosclerosis. Aortic Atherosclerosis (ICD10-I70.0). Electronically Signed   By: Aram Candela M.D.   On: 05/12/2023 17:21     Data Reviewed: Relevant notes from primary care and specialist visits, past discharge summaries as available in EHR, including Care Everywhere. Prior diagnostic testing as pertinent to current admission diagnoses Updated medications and problem lists for reconciliation  ED course, including vitals, labs, imaging, treatment and response to treatment Triage notes, nursing and pharmacy notes and ED  provider's notes Notable results as noted in HPI Assessment and Plan: * Abdominal pain / SBO -Patient with prior h/o abdominal surgeries presenting with acute onset of abdominal pain with n/v, abdominal distention, and CT findings c/w SBO -Will place in observation status on Med Surg -Gen Surg consulted by ER and will see in AM; currently no indication for surgical intervention -NPO for bowel rest -NG tube in place -IVF hydration is LR. -Pain control with morphine -Will observe on Med Surg for now in the hopes that the patient will improve rapidly. -IV PPI - Antiemetics. -ICE chips PRN. -monitor electrolytes.    Hypothyroidism -NPO hold levothyroxine for 1-2 days.  - will change to IV if pt needs to be NPO for longer than 24-48 hours.    Diabetes mellitus type II, non insulin dependent (HCC) Pt is on ozempic. We will start glycemic protocol.    Essential hypertension Vitals and BP are stable. We will monitor and USE PRN IV hydralazine     DVT prophylaxis:  Heparin   Consults:  General surgery : Dr.Sakai  Advance Care Planning:    Code Status: Full Code   Family Communication:  None.   Disposition Plan:  Back to previous home environment  Severity of Illness: The appropriate patient status for this patient is OBSERVATION. Observation status is judged to be reasonable and necessary in order to provide the required intensity of service to ensure the patient's safety. The patient's presenting symptoms, physical exam findings, and initial radiographic and laboratory data in the context of their medical condition is felt to place them at decreased risk for further clinical deterioration. Furthermore, it is anticipated that the patient will be medically stable for discharge from the hospital within 2 midnights of admission.   Author: Gertha Calkin, MD 05/12/2023 8:09 PM  For on call review [email or web address].

## 2023-05-12 NOTE — ED Notes (Signed)
States is returning  Pt is more uncomfortable  Additional meds given

## 2023-05-12 NOTE — ED Provider Notes (Signed)
[facility] Provider Note    Event Date/Time   First MD Initiated Contact with Patient 05/12/23 1544     (approximate)   History   Abdominal Pain   HPI  Denise Case is a 67 y.o. female with history of COPD, diabetes, SBO presenting to the emergency department for evaluation of abdominal pain.  This morning, patient had onset of upper abdominal pain with associated nausea with small-volume emesis.  Did have nonbloody diarrhea last night.  No bowel movement or flatus today.  Reports that the pain is moderate to severe, nonradiating.  Feels similar to when she previously had cholecystitis, but is status post a cholecystectomy.  No fevers.      Physical Exam   Triage Vital Signs: ED Triage Vitals  Enc Vitals Group     BP 05/12/23 1507 101/68     Pulse Rate 05/12/23 1509 90     Resp 05/12/23 1507 20     Temp 05/12/23 1510 98 F (36.7 C)     Temp Source 05/12/23 1507 Oral     SpO2 05/12/23 1509 91 %     Weight 05/12/23 1508 293 lb (132.9 kg)     Height 05/12/23 1508 5\' 4"  (1.626 m)     Head Circumference --      Peak Flow --      Pain Score 05/12/23 1507 4     Pain Loc --      Pain Edu? --      Excl. in GC? --     Most recent vital signs: Vitals:   05/12/23 1510 05/12/23 2023  BP:  (!) 142/64  Pulse:  91  Resp:  16  Temp: 98 F (36.7 C) 98 F (36.7 C)  SpO2:  100%     General: Awake, interactive  CV:  Regular rate, good peripheral perfusion.  Resp:  Lungs clear, unlabored respirations.  Abd:  Soft, nondistended, tenderness throughout the upper abdomen with voluntary guarding. Neuro:  Symmetric facial movement, fluid speech   ED Results / Procedures / Treatments   Labs (all labs ordered are listed, but only abnormal results are displayed) Labs Reviewed  COMPREHENSIVE METABOLIC PANEL - Abnormal; Notable for the following components:      Result Value   Glucose, Bld 192 (*)    AST 46 (*)    All other components within normal  limits  CBC - Abnormal; Notable for the following components:   WBC 14.2 (*)    All other components within normal limits  LIPASE, BLOOD  URINALYSIS, ROUTINE W REFLEX MICROSCOPIC  HIV ANTIBODY (ROUTINE TESTING W REFLEX)  COMPREHENSIVE METABOLIC PANEL  CBC     EKG EKG independently reviewed interpreted by myself (ER attending) demonstrates:    RADIOLOGY Imaging independently reviewed and interpreted by myself demonstrates:  CT demonstrates dilated bowel loops concerning for bowel obstruction  PROCEDURES:  Critical Care performed: No  Procedures   MEDICATIONS ORDERED IN ED: Medications  diatrizoate meglumine-sodium (GASTROGRAFIN) 66-10 % solution 90 mL (has no administration in time range)  heparin injection 5,000 Units (has no administration in time range)  sodium chloride flush (NS) 0.9 % injection 3 mL (has no administration in time range)  morphine (PF) 2 MG/ML injection 2 mg (2 mg Intravenous Given 05/12/23 1957)  lactated ringers infusion (has no administration in time range)  pantoprazole (PROTONIX) injection 40 mg (has no administration in time range)  insulin aspart (novoLOG) injection 0-9 Units (has no administration in time range)  ondansetron (ZOFRAN) injection 4 mg (4 mg Intravenous Given 05/12/23 1631)  sodium chloride 0.9 % bolus 1,000 mL (0 mLs Intravenous Stopped 05/12/23 1730)  morphine (PF) 4 MG/ML injection 4 mg (4 mg Intravenous Given 05/12/23 1631)  iohexol (OMNIPAQUE) 300 MG/ML solution 100 mL (100 mLs Intravenous Contrast Given 05/12/23 1615)  morphine (PF) 4 MG/ML injection 4 mg (4 mg Intravenous Given 05/12/23 1750)  oxymetazoline (AFRIN) 0.05 % nasal spray 1 spray (1 spray Each Nare Given 05/12/23 1815)  lidocaine (XYLOCAINE) 2 % jelly 1 Application (1 Application Other Given 05/12/23 1815)  benzocaine (HURRICAINE) 20 % mouth spray (1 Application Mouth/Throat Given 05/12/23 1957)     IMPRESSION / MDM / ASSESSMENT AND PLAN / ED COURSE  I reviewed the  triage vital signs and the nursing notes.  Differential diagnosis includes, but is not limited to, pancreatitis, diverticulitis, other acute intra-abdominal process  Patient's presentation is most consistent with acute presentation with potential threat to life or bodily function.  67 year old female presenting to the department for evaluation of abdominal pain with associated vomiting and diarrhea.  Does have leukocytosis on lab work with WC of 14.2, tender throughout her upper abdomen on exam.  Will obtain CT abdomen pelvis to further evaluate and treat symptomatically with morphine, Zofran, and IV fluids.  Notified by radiology that patient CT was concerning for a bowel obstruction.  Case was reviewed with Dr. Tonna Boehringer with surgery.  He will evaluate the patient, but does recommend admission to the hospitalist service with placement of an NG tube.  NG tube placement ordered.  Will reach out to hospitalist team.  Case discussed with hospitalist team.  They will evaluate the patient for anticipated admission.      FINAL CLINICAL IMPRESSION(S) / ED DIAGNOSES   Final diagnoses:  Small bowel obstruction (HCC)     Rx / DC Orders   ED Discharge Orders     None        Note:  This document was prepared using Dragon voice recognition software and may include unintentional dictation errors.   Trinna Post, MD 05/12/23 2024

## 2023-05-12 NOTE — ED Notes (Signed)
See triage note  Presents via EMS from home with abd pain and n/v   States sx's started last pm and  became worse  No fever  Pt was given meds per EMS PTA  states it help the pain slightly  but pain it returning

## 2023-05-12 NOTE — ED Notes (Signed)
Xray at BS 

## 2023-05-12 NOTE — Assessment & Plan Note (Addendum)
-  Patient with prior h/o abdominal surgeries presenting with acute onset of abdominal pain with n/v, abdominal distention, and CT findings c/w SBO -Will place in observation status on Med Surg -Gen Surg consulted by ER and will see in AM; currently no indication for surgical intervention -NPO for bowel rest -NG tube in place -IVF hydration is LR. -Pain control with morphine -Will observe on Med Surg for now in the hopes that the patient will improve rapidly. -IV PPI - Antiemetics. -ICE chips PRN. -monitor electrolytes.

## 2023-05-13 ENCOUNTER — Observation Stay: Payer: 59

## 2023-05-13 DIAGNOSIS — E119 Type 2 diabetes mellitus without complications: Secondary | ICD-10-CM

## 2023-05-13 DIAGNOSIS — I10 Essential (primary) hypertension: Secondary | ICD-10-CM | POA: Diagnosis present

## 2023-05-13 DIAGNOSIS — Z79890 Hormone replacement therapy (postmenopausal): Secondary | ICD-10-CM | POA: Diagnosis not present

## 2023-05-13 DIAGNOSIS — E039 Hypothyroidism, unspecified: Secondary | ICD-10-CM | POA: Diagnosis present

## 2023-05-13 DIAGNOSIS — M797 Fibromyalgia: Secondary | ICD-10-CM | POA: Diagnosis present

## 2023-05-13 DIAGNOSIS — Z885 Allergy status to narcotic agent status: Secondary | ICD-10-CM | POA: Diagnosis not present

## 2023-05-13 DIAGNOSIS — Z79899 Other long term (current) drug therapy: Secondary | ICD-10-CM | POA: Diagnosis not present

## 2023-05-13 DIAGNOSIS — Z7984 Long term (current) use of oral hypoglycemic drugs: Secondary | ICD-10-CM | POA: Diagnosis not present

## 2023-05-13 DIAGNOSIS — K56609 Unspecified intestinal obstruction, unspecified as to partial versus complete obstruction: Secondary | ICD-10-CM

## 2023-05-13 DIAGNOSIS — E1140 Type 2 diabetes mellitus with diabetic neuropathy, unspecified: Secondary | ICD-10-CM | POA: Diagnosis present

## 2023-05-13 DIAGNOSIS — K5651 Intestinal adhesions [bands], with partial obstruction: Secondary | ICD-10-CM | POA: Diagnosis present

## 2023-05-13 DIAGNOSIS — Z8719 Personal history of other diseases of the digestive system: Secondary | ICD-10-CM | POA: Diagnosis not present

## 2023-05-13 DIAGNOSIS — K760 Fatty (change of) liver, not elsewhere classified: Secondary | ICD-10-CM | POA: Diagnosis present

## 2023-05-13 DIAGNOSIS — Z87442 Personal history of urinary calculi: Secondary | ICD-10-CM | POA: Diagnosis not present

## 2023-05-13 DIAGNOSIS — J4489 Other specified chronic obstructive pulmonary disease: Secondary | ICD-10-CM | POA: Diagnosis present

## 2023-05-13 DIAGNOSIS — R109 Unspecified abdominal pain: Secondary | ICD-10-CM | POA: Diagnosis present

## 2023-05-13 DIAGNOSIS — K219 Gastro-esophageal reflux disease without esophagitis: Secondary | ICD-10-CM | POA: Diagnosis present

## 2023-05-13 DIAGNOSIS — E785 Hyperlipidemia, unspecified: Secondary | ICD-10-CM | POA: Diagnosis present

## 2023-05-13 DIAGNOSIS — Z6841 Body Mass Index (BMI) 40.0 and over, adult: Secondary | ICD-10-CM | POA: Diagnosis not present

## 2023-05-13 DIAGNOSIS — Z9049 Acquired absence of other specified parts of digestive tract: Secondary | ICD-10-CM | POA: Diagnosis not present

## 2023-05-13 DIAGNOSIS — Z881 Allergy status to other antibiotic agents status: Secondary | ICD-10-CM | POA: Diagnosis not present

## 2023-05-13 DIAGNOSIS — E876 Hypokalemia: Secondary | ICD-10-CM | POA: Diagnosis present

## 2023-05-13 DIAGNOSIS — F17210 Nicotine dependence, cigarettes, uncomplicated: Secondary | ICD-10-CM | POA: Diagnosis present

## 2023-05-13 DIAGNOSIS — Z833 Family history of diabetes mellitus: Secondary | ICD-10-CM | POA: Diagnosis not present

## 2023-05-13 LAB — CBC
HCT: 41 % (ref 36.0–46.0)
Hemoglobin: 13.4 g/dL (ref 12.0–15.0)
MCH: 30.7 pg (ref 26.0–34.0)
MCHC: 32.7 g/dL (ref 30.0–36.0)
MCV: 94 fL (ref 80.0–100.0)
Platelets: 274 10*3/uL (ref 150–400)
RBC: 4.36 MIL/uL (ref 3.87–5.11)
RDW: 13.4 % (ref 11.5–15.5)
WBC: 11.8 10*3/uL — ABNORMAL HIGH (ref 4.0–10.5)
nRBC: 0 % (ref 0.0–0.2)

## 2023-05-13 LAB — GLUCOSE, CAPILLARY
Glucose-Capillary: 175 mg/dL — ABNORMAL HIGH (ref 70–99)
Glucose-Capillary: 156 mg/dL — ABNORMAL HIGH (ref 70–99)
Glucose-Capillary: 134 mg/dL — ABNORMAL HIGH (ref 70–99)
Glucose-Capillary: 137 mg/dL — ABNORMAL HIGH (ref 70–99)
Glucose-Capillary: 138 mg/dL — ABNORMAL HIGH (ref 70–99)

## 2023-05-13 LAB — COMPREHENSIVE METABOLIC PANEL
ALT: 34 U/L (ref 0–44)
AST: 37 U/L (ref 15–41)
Albumin: 3.4 g/dL — ABNORMAL LOW (ref 3.5–5.0)
Alkaline Phosphatase: 61 U/L (ref 38–126)
Anion gap: 10 (ref 5–15)
BUN: 14 mg/dL (ref 8–23)
CO2: 29 mmol/L (ref 22–32)
Calcium: 8.2 mg/dL — ABNORMAL LOW (ref 8.9–10.3)
Chloride: 104 mmol/L (ref 98–111)
Creatinine, Ser: 0.61 mg/dL (ref 0.44–1.00)
GFR, Estimated: >60 mL/min (ref >60)
Glucose, Bld: 160 mg/dL — ABNORMAL HIGH (ref 70–99)
Potassium: 3.6 mmol/L (ref 3.5–5.1)
Sodium: 143 mmol/L (ref 135–145)
Total Bilirubin: 0.5 mg/dL (ref 0.3–1.2)
Total Protein: 7.1 g/dL (ref 6.5–8.1)

## 2023-05-13 LAB — HIV ANTIBODY (ROUTINE TESTING W REFLEX): HIV Screen 4th Generation wRfx: NONREACTIVE

## 2023-05-13 MED ORDER — HYDROMORPHONE HCL 1 MG/ML IJ SOLN
0.5000 mg | INTRAMUSCULAR | Status: DC | PRN
  Administered 2023-05-13 – 2023-05-14 (×5): 0.5 mg via INTRAVENOUS
  Filled 2023-05-13 (×5): qty 0.5

## 2023-05-13 MED ORDER — LORAZEPAM 2 MG/ML IJ SOLN
0.5000 mg | Freq: Four times a day (QID) | INTRAMUSCULAR | Status: DC | PRN
  Administered 2023-05-13 – 2023-05-14 (×5): 0.5 mg via INTRAVENOUS
  Filled 2023-05-13 (×6): qty 1

## 2023-05-13 NOTE — Progress Notes (Signed)
  Progress Note   Patient: Denise Case ZOX:[id number] DOB: [birth year] DOA: 05/12/2023     0 DOS: the patient was seen and examined on 05/13/2023   Brief hospital course: Denise Case is a 67 year old female with history of COPD, depression, type 2 diabetes, NASH, essential hypertension, dyslipidemia, hypothyroidism, morbid obesity, prior abdominal surgery with prior SBO who present to the hospital with worsening abdominal pain and nausea vomiting.  Abdominal film and a CT scan were performed, showed significant small bowel obstruction.  He is seen by general surgery, was placed on NG suction, IV fluids.   Principal Problem:   Abdominal pain / SBO Active Problems:   SBO (small bowel obstruction) (HCC)   Hypothyroidism   Essential hypertension   Diabetes mellitus type II, non insulin dependent (HCC)   Wheezing on auscultation / COPD / Tobacco abuse   Assessment and Plan: * Abdominal pain / SBO SBO appears to be secondary to patient from prior surgery. She is seen by general surgery, will continue n.p.o., IV fluids and NG suction   Hypothyroidism Medication on hold.   Wheezing on auscultation / COPD / Tobacco abuse As needed bronchodilator.  Diabetes mellitus type II, non insulin dependent (HCC) Continue sliding scale insulin.   Essential hypertension Continue to monitor.  Morbid obesity with BMI 50.71. Diet and exercise advised.      Subjective:  Patient still complaining significant abdominal cramping pain, nausea, no vomiting.  No bowel movement.  Physical Exam: Vitals:   05/12/23 1510 05/12/23 2023 05/13/23 0400 05/13/23 0820  BP:  (!) 142/64 138/61 (!) 88/67  Pulse:  91 84 85  Resp:  16 16 18   Temp: 98 F (36.7 C) 98 F (36.7 C) 97.9 F (36.6 C) 98 F (36.7 C)  TempSrc: Oral Oral Oral   SpO2:  100% 99% 95%  Weight:  134 kg    Height:  5\' 4"  (1.626 m)     General exam: Appears calm and comfortable  Respiratory system: Decreased breath sounds.  Respiratory effort normal. Cardiovascular system: S1 & S2 heard, RRR. No JVD, murmurs, rubs, gallops or clicks. No pedal edema. Gastrointestinal system: Abdomen is nondistended, soft and diffuse tender. No organomegaly or masses felt. Normal bowel sounds heard. Central nervous system: Alert and oriented. No focal neurological deficits. Extremities: Symmetric 5 x 5 power. Skin: No rashes, lesions or ulcers Psychiatry: Judgement and insight appear normal. Mood & affect appropriate.    Data Reviewed:  X-ray, CT scan lab results reviewed.  Family Communication: None  Disposition: Status is: Inpatient Remains inpatient appropriate because: Severity of disease, IV treatment.     Time spent: 35 minutes  Author: Marrion Coy, MD 05/13/2023 3:37 PM  For on call review [email or web address].

## 2023-05-13 NOTE — Progress Notes (Signed)
  Chaplain On-Call responded to Spiritual Care Consult Order from [place]. Denise Katz, MD. The request was to provide Advance Directives information to the patient.  Chaplain visited the patient, gave the documents to her, and described the process for completion if she chooses to do so in the hospital. Patient stated her understanding.  Chaplain Evelena Peat M.Div., [facility]

## 2023-05-13 NOTE — Hospital Course (Addendum)
Denise Case is a 67 year old female with history of COPD, depression, type 2 diabetes, NASH, essential hypertension, dyslipidemia, hypothyroidism, morbid obesity, prior abdominal surgery with prior SBO who present to the hospital with worsening abdominal pain and nausea vomiting.  Abdominal film and a CT scan were performed, showed significant small bowel obstruction.  He is seen by general surgery, was placed on NG suction, IV fluids. Patient had a bowel movement with improved symptoms, start liquid diet on 6/13.

## 2023-05-13 NOTE — Progress Notes (Signed)
Subjective:  CC: Denise Case is a 67 y.o. female  Hospital stay day 0,   SBO  HPI: No acute issues overnight.  Denies flatus or BM still has pain in epigastric region.  ROS:  General: Denies weight loss, weight gain, fatigue, fevers, chills, and night sweats. Heart: Denies chest pain, palpitations, racing heart, irregular heartbeat, leg pain or swelling, and decreased activity tolerance. Respiratory: Denies breathing difficulty, shortness of breath, wheezing, cough, and sputum. GI: Denies change in appetite, heartburn, nausea, vomiting, constipation, diarrhea, and blood in stool. GU: Denies difficulty urinating, pain with urinating, urgency, frequency, blood in urine.   Objective:   Temp:  [97.9 F (36.6 C)-98 F (36.7 C)] 98 F (36.7 C) (06/12 0820) Pulse Rate:  [84-91] 85 (06/12 0820) Resp:  [16-20] 18 (06/12 0820) BP: (88-142)/(61-68) 88/67 (06/12 0820) SpO2:  [91 %-100 %] 95 % (06/12 0820) Weight:  [132.9 kg-134 kg] 134 kg (06/11 2023)     Height: 5\' 4"  (162.6 cm) Weight: 134 kg BMI (Calculated): 50.68   Intake/Output this shift:   Intake/Output Summary (Last 24 hours) at 05/13/2023 1040 Last data filed at 05/13/2023 0900 Gross per 24 hour  Intake 350 ml  Output 1000 ml  Net -650 ml    Constitutional :  alert, cooperative, appears stated age, and no distress  Respiratory:  clear to auscultation bilaterally  Cardiovascular:  regular rate and rhythm  Gastrointestinal: Soft, no guarding, tenderness to palpation more prominent in epigastric region today, still has periumbilical pain as well .   Skin: Cool and moist  Psychiatric: Normal affect, non-agitated, not confused       LABS:     Latest Ref Rng & Units 05/13/2023    6:30 AM 05/12/2023    3:11 PM 07/24/2022    9:13 AM  CMP  Glucose 70 - 99 mg/dL 161  096  045   BUN 8 - 23 mg/dL 14  17  8    Creatinine 0.44 - 1.00 mg/dL 4.09  8.11  9.14   Sodium 135 - 145 mmol/L 143  140  137   Potassium 3.5 - 5.1 mmol/L 3.6   3.8  3.6   Chloride 98 - 111 mmol/L 104  101  104   CO2 22 - 32 mmol/L 29  27  23    Calcium 8.9 - 10.3 mg/dL 8.2  9.8  9.0   Total Protein 6.5 - 8.1 g/dL 7.1  8.1    Total Bilirubin 0.3 - 1.2 mg/dL 0.5  0.4    Alkaline Phos 38 - 126 U/L 61  65    AST 15 - 41 U/L 37  46    ALT 0 - 44 U/L 34  32        Latest Ref Rng & Units 05/13/2023    6:30 AM 05/12/2023    3:11 PM 07/24/2022    9:13 AM  CBC  WBC 4.0 - 10.5 K/uL 11.8  14.2  9.3   Hemoglobin 12.0 - 15.0 g/dL 78.2  95.6  21.3   Hematocrit 36.0 - 46.0 % 41.0  43.6  42.1   Platelets 150 - 400 K/uL 274  299  209     RADS: CLINICAL DATA:  67 year old female with history of small-bowel obstruction.   EXAM: PORTABLE ABDOMEN - 1 VIEW   COMPARISON:  05/12/2023.   FINDINGS: Nasogastric tube noted in the stomach with tip in the antral pre-pyloric region. Contrast material is noted in the proximal stomach. Numerous dilated loops of small  bowel are noted throughout the central abdomen, measuring up to 4.5 cm in the right-side of the mid abdomen. Paucity of colonic gas and stool. Iodinated contrast material noted in the lumen of the urinary bladder.   IMPRESSION: 1. Support apparatus, as above. 2. Oral contrast material noted predominantly in the stomach. 3. Bowel-gas pattern remains compatible with small bowel obstruction, as above.     Electronically Signed   By: Trudie Reed M.D.   On: 05/13/2023 06:09   Assessment:   SBO.  Still has output from NG and no return of bowel function.  Encourage ice chips ambulation.  Repeat x-ray at 24-hour mark to reassess.  Will consider surgical lysis of adhesions if no improvement in the next day or so.  labs/images/medications/previous chart entries reviewed personally and relevant changes/updates noted above.

## 2023-05-14 ENCOUNTER — Inpatient Hospital Stay: Payer: 59

## 2023-05-14 DIAGNOSIS — E876 Hypokalemia: Secondary | ICD-10-CM | POA: Insufficient documentation

## 2023-05-14 DIAGNOSIS — I10 Essential (primary) hypertension: Secondary | ICD-10-CM | POA: Diagnosis not present

## 2023-05-14 DIAGNOSIS — K56609 Unspecified intestinal obstruction, unspecified as to partial versus complete obstruction: Secondary | ICD-10-CM | POA: Diagnosis not present

## 2023-05-14 DIAGNOSIS — E119 Type 2 diabetes mellitus without complications: Secondary | ICD-10-CM | POA: Diagnosis not present

## 2023-05-14 LAB — BASIC METABOLIC PANEL
Anion gap: 11 (ref 5–15)
BUN: 13 mg/dL (ref 8–23)
CO2: 28 mmol/L (ref 22–32)
Calcium: 9.1 mg/dL (ref 8.9–10.3)
Chloride: 103 mmol/L (ref 98–111)
Creatinine, Ser: 0.64 mg/dL (ref 0.44–1.00)
GFR, Estimated: >60 mL/min (ref >60)
Glucose, Bld: 167 mg/dL — ABNORMAL HIGH (ref 70–99)
Potassium: 3.4 mmol/L — ABNORMAL LOW (ref 3.5–5.1)
Sodium: 142 mmol/L (ref 135–145)

## 2023-05-14 LAB — GLUCOSE, CAPILLARY
Glucose-Capillary: 139 mg/dL — ABNORMAL HIGH (ref 70–99)
Glucose-Capillary: 140 mg/dL — ABNORMAL HIGH (ref 70–99)
Glucose-Capillary: 141 mg/dL — ABNORMAL HIGH (ref 70–99)
Glucose-Capillary: 147 mg/dL — ABNORMAL HIGH (ref 70–99)
Glucose-Capillary: 151 mg/dL — ABNORMAL HIGH (ref 70–99)
Glucose-Capillary: 157 mg/dL — ABNORMAL HIGH (ref 70–99)
Glucose-Capillary: 169 mg/dL — ABNORMAL HIGH (ref 70–99)

## 2023-05-14 LAB — MAGNESIUM: Magnesium: 2.3 mg/dL (ref 1.7–2.4)

## 2023-05-14 MED ORDER — ATORVASTATIN CALCIUM 10 MG PO TABS
10.0000 mg | ORAL_TABLET | Freq: Every day | ORAL | Status: DC
  Administered 2023-05-14 – 2023-05-15 (×2): 10 mg via ORAL
  Filled 2023-05-14 (×2): qty 1

## 2023-05-14 MED ORDER — POTASSIUM CHLORIDE 2 MEQ/ML IV SOLN
INTRAVENOUS | Status: DC
  Filled 2023-05-14 (×3): qty 1000

## 2023-05-14 MED ORDER — KCL-LACTATED RINGERS 20 MEQ/L IV SOLN: INTRAVENOUS | Status: DC

## 2023-05-14 MED ORDER — THEOPHYLLINE ER 200 MG PO CP24
300.0000 mg | ORAL_CAPSULE | Freq: Every day | ORAL | Status: DC
  Filled 2023-05-14: qty 1

## 2023-05-14 MED ORDER — LEVOTHYROXINE SODIUM 50 MCG PO TABS
225.0000 ug | ORAL_TABLET | Freq: Every day | ORAL | Status: DC
  Administered 2023-05-15: 225 ug via ORAL
  Filled 2023-05-14: qty 1

## 2023-05-14 MED ORDER — TRAMADOL HCL 50 MG PO TABS
50.0000 mg | ORAL_TABLET | Freq: Four times a day (QID) | ORAL | Status: DC | PRN
  Administered 2023-05-14: 50 mg via ORAL
  Filled 2023-05-14: qty 1

## 2023-05-14 MED ORDER — GABAPENTIN 400 MG PO CAPS
800.0000 mg | ORAL_CAPSULE | Freq: Three times a day (TID) | ORAL | Status: DC
  Administered 2023-05-14 – 2023-05-15 (×3): 800 mg via ORAL
  Filled 2023-05-14 (×3): qty 2

## 2023-05-14 MED ORDER — ALPRAZOLAM 0.5 MG PO TABS
1.0000 mg | ORAL_TABLET | Freq: Three times a day (TID) | ORAL | Status: DC
  Administered 2023-05-14 – 2023-05-15 (×3): 1 mg via ORAL
  Filled 2023-05-14 (×3): qty 2

## 2023-05-14 MED ORDER — FLUOXETINE HCL 20 MG PO CAPS
20.0000 mg | ORAL_CAPSULE | Freq: Every day | ORAL | Status: DC
  Administered 2023-05-14 – 2023-05-15 (×2): 20 mg via ORAL
  Filled 2023-05-14 (×2): qty 1

## 2023-05-14 MED ORDER — VITAMIN B-12 1000 MCG PO TABS
1000.0000 ug | ORAL_TABLET | Freq: Every day | ORAL | Status: DC
  Administered 2023-05-14 – 2023-05-15 (×2): 1000 ug via ORAL
  Filled 2023-05-14 (×2): qty 1

## 2023-05-14 MED ORDER — THEOPHYLLINE ER 100 MG PO CP24
300.0000 mg | ORAL_CAPSULE | Freq: Every day | ORAL | Status: DC
  Administered 2023-05-14 – 2023-05-15 (×2): 300 mg via ORAL
  Filled 2023-05-14 (×2): qty 3

## 2023-05-14 MED ORDER — ALLOPURINOL 100 MG PO TABS
300.0000 mg | ORAL_TABLET | Freq: Every day | ORAL | Status: DC
  Administered 2023-05-14 – 2023-05-15 (×2): 300 mg via ORAL
  Filled 2023-05-14 (×2): qty 3

## 2023-05-14 MED ORDER — QUETIAPINE FUMARATE 25 MG PO TABS
25.0000 mg | ORAL_TABLET | Freq: Every day | ORAL | Status: DC
  Administered 2023-05-14: 25 mg via ORAL
  Filled 2023-05-14: qty 1

## 2023-05-14 NOTE — TOC CM/SW Note (Signed)
Transition of Care [facility]) - Inpatient Brief Assessment   Patient Details  Name: Denise Case MRN: [id number] Date of Birth: [birth year]  Transition of [facility]) CM/SW Contact:    Chapman Fitch, RN Phone Number: 05/14/2023, 2:54 PM   Clinical Narrative:    Transition of Care Asessment: Insurance and Status: Insurance coverage has been reviewed Patient has primary care physician: Yes     Prior/Current Home Services: No current home services Social Determinants of Health Reivew: SDOH reviewed no interventions necessary Readmission risk has been reviewed: Yes Transition of care needs: no transition of care needs at this time

## 2023-05-14 NOTE — Evaluation (Signed)
Physical Therapy Evaluation Patient Details Name: Denise Case MRN: [id number] DOB: [birth year] Today's Date: 05/14/2023  History of Present Illness  67 y/o female presented to ED on 05/12/23 for abdominal pain with vomiting and diarrhea. Admitted for SBO which was found on CT. PMH: DM type 2, HTN  Clinical Impression  Patient admitted with the above. PTA, patient lives with husband who has cancer and limited energy but patient reports independence with no AD in the home and RW/wheelchair in the community. Patient presents with weakness, impaired balance, and decreased activity tolerance. Required min guard for sit to stand and ambulation with RW in hallway. VSS on RA with spO2 >89%. Patient will benefit from skilled PT services during acute stay to address listed deficits. Patient will benefit from ongoing therapy at discharge to maximize functional independence and safety.        Recommendations for follow up therapy are one component of a multi-disciplinary discharge planning process, led by the attending physician.  Recommendations may be updated based on patient status, additional functional criteria and insurance authorization.  Follow Up Recommendations       Assistance Recommended at Discharge Intermittent Supervision/Assistance  Patient can return home with the following  A little help with walking and/or transfers;A little help with bathing/dressing/bathroom;Assistance with cooking/housework;Help with stairs or ramp for entrance    Equipment Recommendations None recommended by PT  Recommendations for Other Services       Functional Status Assessment Patient has had a recent decline in their functional status and demonstrates the ability to make significant improvements in function in a reasonable and predictable amount of time.     Precautions / Restrictions Precautions Precautions: Fall Restrictions Weight Bearing Restrictions: No      Mobility  Bed Mobility Overal bed  mobility: Modified Independent                  Transfers Overall transfer level: Needs assistance Equipment used: Rolling Denise Case (2 wheels) Transfers: Sit to/from Stand Sit to Stand: Min guard           General transfer comment: min guard for safety. Pulls on RW to stand. Cues for hand placement prior to standing    Ambulation/Gait Ambulation/Gait assistance: Min guard Gait Distance (Feet): 180 Feet Assistive device: Rolling Denise Case (2 wheels) Gait Pattern/deviations: Step-through pattern, Decreased stride length Gait velocity: decreased     General Gait Details: min guard for safety. General unsteadiness noted with mobility  Stairs            Wheelchair Mobility    Modified Rankin (Stroke Patients Only)       Balance Overall balance assessment: Needs assistance Sitting-balance support: No upper extremity supported, Feet supported Sitting balance-Denise Case Scale: Good     Standing balance support: Bilateral upper extremity supported, Reliant on assistive device for balance Standing balance-Denise Case Scale: Fair                               Pertinent Vitals/Pain Pain Assessment Pain Assessment: Faces Faces Pain Scale: Hurts little more Pain Location: chest 2/2 anxiety per patient Pain Descriptors / Indicators: Discomfort Pain Intervention(s): Limited activity within patient's tolerance, Monitored during session, Repositioned    Home Living Family/patient expects to be discharged to:: Private residence Living Arrangements: Spouse/significant other Available Help at Discharge: Family;Available PRN/intermittently (spouse undergoing cancer treatments, other family VERY limited assist) Type of Home: House Home Access: Ramped entrance  Home Layout: One level Home Equipment: Other (comment);BSC/3in1;Adaptive equipment;Shower seat;Wheelchair - Forensic psychologist (2 wheels);Rollator (4 wheels) Additional Comments: has tub rail, wears  CPAP at night with 2L O2    Prior Function Prior Level of Function : Independent/Modified Independent;History of Falls (last six months)             Mobility Comments: Pt ambulating with 2WW vs w/c in community, 5+ falls in past 95mo 2/2 legs giving out and shuffling ADLs Comments: Pt endorses having assist for tub transfers to improve safety     Hand Dominance        Extremity/Trunk Assessment   Upper Extremity Assessment Upper Extremity Assessment: Overall WFL for tasks assessed    Lower Extremity Assessment Lower Extremity Assessment: Generalized weakness       Communication   Communication: No difficulties  Cognition Arousal/Alertness: Awake/alert Behavior During Therapy: WFL for tasks assessed/performed Overall Cognitive Status: Within Functional Limits for tasks assessed                                          General Comments General comments (skin integrity, edema, etc.): VSS on RA, SpO2 >89% throughout    Exercises     Assessment/Plan    PT Assessment Patient needs continued PT services  PT Problem List Decreased strength;Decreased activity tolerance;Decreased balance;Decreased mobility;Decreased safety awareness;Decreased knowledge of use of DME;Cardiopulmonary status limiting activity       PT Treatment Interventions Gait training;DME instruction;Functional mobility training;Therapeutic activities;Therapeutic exercise;Balance training;Patient/family education    PT Goals (Current goals can be found in the Care Plan section)  Acute Rehab PT Goals Patient Stated Goal: to go home PT Goal Formulation: With patient Time For Goal Achievement: 05/28/23 Potential to Achieve Goals: Good    Frequency Min 2X/week     Co-evaluation PT/OT/SLP Co-Evaluation/Treatment: Yes Reason for Co-Treatment: To address functional/ADL transfers PT goals addressed during session: Mobility/safety with mobility OT goals addressed during session: ADL's  and self-care       AM-PAC PT "6 Clicks" Mobility  Outcome Measure Help needed turning from your back to your side while in a flat bed without using bedrails?: A Little Help needed moving from lying on your back to sitting on the side of a flat bed without using bedrails?: A Little Help needed moving to and from a bed to a chair (including a wheelchair)?: A Little Help needed standing up from a chair using your arms (e.g., wheelchair or bedside chair)?: A Little Help needed to walk in hospital room?: A Little Help needed climbing 3-5 steps with a railing? : A Little 6 Click Score: 18    End of Session   Activity Tolerance: Patient tolerated treatment well Patient left: in bed;with call bell/phone within reach;with family/visitor present Nurse Communication: Mobility status PT Visit Diagnosis: Unsteadiness on feet (R26.81);Muscle weakness (generalized) (M62.81)    Time: 1610-9604 PT Time Calculation (min) (ACUTE ONLY): 30 min   Charges:   PT Evaluation $PT Eval Low Complexity: 1 Low PT Treatments $Therapeutic Activity: 8-22 mins        Maylon Peppers, PT, DPT Physical Therapist - [facility]  [facility]   Amalea Ottey A Jru Pense 05/14/2023, 4:16 PM

## 2023-05-14 NOTE — Progress Notes (Signed)
  Progress Note   Patient: Denise Case UJW:[id number] DOB: February 10, 1956 DOA: 05/12/2023     1 DOS: the patient was seen and examined on 05/14/2023   Brief hospital course: Denise Case is a 67 year old female with history of COPD, depression, type 2 diabetes, NASH, essential hypertension, dyslipidemia, hypothyroidism, morbid obesity, prior abdominal surgery with prior SBO who present to the hospital with worsening abdominal pain and nausea vomiting.  Abdominal film and a CT scan were performed, showed significant small bowel obstruction.  He is seen by general surgery, was placed on NG suction, IV fluids. Patient had a bowel movement with improved symptoms, start liquid diet on 6/13.   Principal Problem:   Abdominal pain / SBO Active Problems:   SBO (small bowel obstruction) (HCC)   Hypothyroidism   Essential hypertension   Diabetes mellitus type II, non insulin dependent (HCC)   Wheezing on auscultation / COPD / Tobacco abuse   Hypokalemia   Assessment and Plan:  Abdominal pain / SBO SBO appears to be secondary to patient from prior surgery. Condition improving, diet started.  Will follow for another day, most likely will discharge home tomorrow  Hypokalemia  Added potassium into the IV fluids for another day until patient can tolerate diet.     Hypothyroidism Resume Synthroid.   Wheezing on auscultation / COPD / Tobacco abuse As needed bronchodilator.   Diabetes mellitus type II, non insulin dependent (HCC) Continue sliding scale insulin.     Essential hypertension Continue to monitor.   Morbid obesity with BMI 49.84 Diet and exercise advised.        Subjective:  Patient doing much better today, had a bowel movement.  No nausea vomiting. Could not sleep at night.  Physical Exam: Vitals:   05/13/23 0820 05/13/23 2037 05/14/23 0349 05/14/23 0756  BP: (!) 88/67 132/66 130/67 133/68  Pulse: 85 78 71 89  Resp: 18 18 18 18   Temp: 98 F (36.7 C) 98.4 F (36.9  C) 98.1 F (36.7 C) 98.3 F (36.8 C)  TempSrc:  Oral Oral Oral  SpO2: 95% 98% 99% 92%  Weight:   131.7 kg   Height:       General exam: Appears calm and comfortable, morbid obese Respiratory system: Clear to auscultation. Respiratory effort normal. Cardiovascular system: S1 & S2 heard, RRR. No JVD, murmurs, rubs, gallops or clicks. No pedal edema. Gastrointestinal system: Abdomen is nondistended, soft and nontender. No organomegaly or masses felt. Normal bowel sounds heard. Central nervous system: Alert and oriented. No focal neurological deficits. Extremities: Symmetric 5 x 5 power. Skin: No rashes, lesions or ulcers Psychiatry: Judgement and insight appear normal. Mood & affect appropriate.    Data Reviewed:  Lab results reviewed.  Family Communication: None  Disposition: Status is: Inpatient Remains inpatient appropriate because: Severity of disease, IV treatment.     Time spent: 35 minutes  Author: Marrion Coy, MD 05/14/2023 1:21 PM  For on call review [email or web address].

## 2023-05-14 NOTE — Plan of Care (Signed)

## 2023-05-14 NOTE — Progress Notes (Signed)
Subjective:  CC: Denise Case is a 67 y.o. female  Hospital stay day 1,   SBO  HPI: No acute issues overnight.  BM and flatus this am, tolerated clamp trial.  No more pain  ROS:  General: Denies weight loss, weight gain, fatigue, fevers, chills, and night sweats. Heart: Denies chest pain, palpitations, racing heart, irregular heartbeat, leg pain or swelling, and decreased activity tolerance. Respiratory: Denies breathing difficulty, shortness of breath, wheezing, cough, and sputum. GI: Denies change in appetite, heartburn, nausea, vomiting, constipation, diarrhea, and blood in stool. GU: Denies difficulty urinating, pain with urinating, urgency, frequency, blood in urine.   Objective:   Temp:  [98.1 F (36.7 C)-98.4 F (36.9 C)] 98.3 F (36.8 C) (06/13 0756) Pulse Rate:  [71-89] 89 (06/13 0756) Resp:  [18] 18 (06/13 0756) BP: (130-133)/(66-68) 133/68 (06/13 0756) SpO2:  [92 %-99 %] 92 % (06/13 0756) Weight:  [131.7 kg] 131.7 kg (06/13 0349)     Height: 5\' 4"  (162.6 cm) Weight: 131.7 kg BMI (Calculated): 49.81   Intake/Output this shift:   Intake/Output Summary (Last 24 hours) at 05/14/2023 1358 Last data filed at 05/14/2023 1239 Gross per 24 hour  Intake --  Output 2880 ml  Net -2880 ml    Constitutional :  alert, cooperative, appears stated age, and no distress  Respiratory:  clear to auscultation bilaterally  Cardiovascular:  regular rate and rhythm  Gastrointestinal: soft, non-tender; bowel sounds normal; no masses,  no organomegaly.   Skin: Cool and moist.   Psychiatric: Normal affect, non-agitated, not confused       LABS:     Latest Ref Rng & Units 05/14/2023    5:34 AM 05/13/2023    6:30 AM 05/12/2023    3:11 PM  CMP  Glucose 70 - 99 mg/dL 161  096  045   BUN 8 - 23 mg/dL 13  14  17    Creatinine 0.44 - 1.00 mg/dL 4.09  8.11  9.14   Sodium 135 - 145 mmol/L 142  143  140   Potassium 3.5 - 5.1 mmol/L 3.4  3.6  3.8   Chloride 98 - 111 mmol/L 103  104  101    CO2 22 - 32 mmol/L 28  29  27    Calcium 8.9 - 10.3 mg/dL 9.1  8.2  9.8   Total Protein 6.5 - 8.1 g/dL  7.1  8.1   Total Bilirubin 0.3 - 1.2 mg/dL  0.5  0.4   Alkaline Phos 38 - 126 U/L  61  65   AST 15 - 41 U/L  37  46   ALT 0 - 44 U/L  34  32       Latest Ref Rng & Units 05/13/2023    6:30 AM 05/12/2023    3:11 PM 07/24/2022    9:13 AM  CBC  WBC 4.0 - 10.5 K/uL 11.8  14.2  9.3   Hemoglobin 12.0 - 15.0 g/dL 78.2  95.6  21.3   Hematocrit 36.0 - 46.0 % 41.0  43.6  42.1   Platelets 150 - 400 K/uL 274  299  209     RADS: CLINICAL DATA:  27 hour small-bowel obstruction follow-up   EXAM: PORTABLE ABDOMEN - 1 VIEW   COMPARISON:  05/13/2023   FINDINGS: Gastric catheter remains in the stomach. Previously administered contrast lies within the colon although persistent small bowel dilatation is noted. These changes are consistent with a partial small bowel obstruction. No free air is noted. No  bony abnormality is seen.   IMPRESSION: Changes consistent with partial small bowel obstruction.     Electronically Signed   By: Alcide Clever M.D.   On: 05/14/2023 00:39   Assessment:   SBO, resolving.  Advance diet as tolerated  labs/images/medications/previous chart entries reviewed personally and relevant changes/updates noted above.

## 2023-05-14 NOTE — Evaluation (Signed)
Occupational Therapy Evaluation Patient Details Name: Denise Case MRN: [id number] DOB: 21-Jan-1956 Today's Date: 05/14/2023   History of Present Illness 67 y/o female presented to ED on 05/12/23 for abdominal pain with vomiting and diarrhea. Admitted for SBO which was found on CT. PMH: DM type 2, HTN   Clinical Impression   Pt was seen for OT evaluation this date and cotx with PT to optimize safety with ADL/mobility. Prior to hospital admission, pt was independent with no AD use inside the home but using 2WW vs WC for community mobility. She lives with her spouse who is currently undergoing cancer treatments. Pt notes she does not have family available for assist. Pt presents to acute OT demonstrating impaired ADL performance and functional mobility 2/2 decr activity tolerance, chronic pain, decr balance, safety, and weakness (See OT problem list for additional functional deficits). Pt currently requires CGA for ADL transfers with VC for hand placement, PRN MIN A for LB ADL. Pt SpO2 remained >89% throughout session on RA. Pt endorsing chest pain attributed to anxiety near of session. Pt instructed in activity pacing, pursed lip breathing, and falls prevention.  Pt would benefit from skilled OT services to address noted impairments and functional limitations (see below for any additional details) in order to maximize safety and independence while minimizing falls risk and caregiver burden. Anticipate the need for follow up OT services upon acute hospital DC.    Recommendations for follow up therapy are one component of a multi-disciplinary discharge planning process, led by the attending physician.  Recommendations may be updated based on patient status, additional functional criteria and insurance authorization.   Assistance Recommended at Discharge Intermittent Supervision/Assistance  Patient can return home with the following A little help with walking and/or transfers;A little help with  bathing/dressing/bathroom;Assistance with cooking/housework;Assist for transportation;Help with stairs or ramp for entrance    Functional Status Assessment  Patient has had a recent decline in their functional status and demonstrates the ability to make significant improvements in function in a reasonable and predictable amount of time.  Equipment Recommendations  None recommended by OT    Recommendations for Other Services       Precautions / Restrictions Precautions Precautions: Fall Restrictions Weight Bearing Restrictions: No      Mobility Bed Mobility Overal bed mobility: Modified Independent                  Transfers Overall transfer level: Needs assistance Equipment used: Rolling walker (2 wheels) Transfers: Sit to/from Stand Sit to Stand: Min guard           General transfer comment: min guard for safety. Pulls on RW to stand. Cues for hand placement prior to standing      Balance Overall balance assessment: Needs assistance Sitting-balance support: No upper extremity supported, Feet supported Sitting balance-Leahy Scale: Good     Standing balance support: Bilateral upper extremity supported, Reliant on assistive device for balance Standing balance-Leahy Scale: Fair                             ADL either performed or assessed with clinical judgement   ADL                                         General ADL Comments: Near baseline, requires PRN MIN A for LB ADL and CGA  for ADL transfers at this time.     Vision         Perception     Praxis      Pertinent Vitals/Pain Pain Assessment Pain Assessment: Faces Faces Pain Scale: Hurts little more Pain Location: chest 2/2 anxiety per patient Pain Descriptors / Indicators: Discomfort, Grimacing Pain Intervention(s): Limited activity within patient's tolerance, Monitored during session, Repositioned, Patient requesting pain meds-RN notified     Hand Dominance      Extremity/Trunk Assessment Upper Extremity Assessment Upper Extremity Assessment: Overall WFL for tasks assessed   Lower Extremity Assessment Lower Extremity Assessment: Generalized weakness       Communication Communication Communication: No difficulties   Cognition Arousal/Alertness: Awake/alert Behavior During Therapy: WFL for tasks assessed/performed Overall Cognitive Status: Within Functional Limits for tasks assessed                                 General Comments: endorses some mild anxiety near the end of session     General Comments  VSS on RA, SpO2 >89% throughout    Exercises Other Exercises Other Exercises: Pt educated in PLB to support breath recovery and feeling anxious, activity pacing and falls prevention   Shoulder Instructions      Home Living Family/patient expects to be discharged to:: Private residence Living Arrangements: Spouse/significant other Available Help at Discharge: Family;Available PRN/intermittently (spouse undergoing cancer treatments, other family VERY limited assist) Type of Home: House Home Access: Ramped entrance     Home Layout: One level     Bathroom Shower/Tub: Chief Strategy Officer: Handicapped height     Home Equipment: Other (comment);BSC/3in1;Adaptive equipment;Shower seat;Wheelchair - Forensic psychologist (2 wheels);Rollator (4 wheels) Adaptive Equipment: Reacher;Long-handled shoe horn;Long-handled sponge Additional Comments: has tub rail, wears CPAP at night with 2L O2      Prior Functioning/Environment Prior Level of Function : Independent/Modified Independent;History of Falls (last six months)             Mobility Comments: Pt ambulating with 2WW vs w/c in community, 5+ falls in past 57mo 2/2 legs giving out and shuffling ADLs Comments: Pt endorses having assist for tub transfers to improve safety        OT Problem List: Decreased strength;Decreased activity  tolerance;Decreased knowledge of use of DME or AE      OT Treatment/Interventions: Self-care/ADL training;Therapeutic exercise;Therapeutic activities;Energy conservation;DME and/or AE instruction;Patient/family education    OT Goals(Current goals can be found in the care plan section) Acute Rehab OT Goals Patient Stated Goal: go home OT Goal Formulation: With patient Time For Goal Achievement: 05/28/23 Potential to Achieve Goals: Good ADL Goals Pt Will Perform Lower Body Dressing: with modified independence Additional ADL Goal #1: Pt will verbalize plan to implement at least 2 learned ECS to support improved safety with ADL and IADL. Additional ADL Goal #2: Pt will verbalize plan to implement at least 2 learned falls prevention strategies to maximize safety.  OT Frequency: Min 1X/week    Co-evaluation PT/OT/SLP Co-Evaluation/Treatment: Yes Reason for Co-Treatment: To address functional/ADL transfers PT goals addressed during session: Mobility/safety with mobility OT goals addressed during session: ADL's and self-care      AM-PAC OT "6 Clicks" Daily Activity     Outcome Measure Help from another person eating meals?: None Help from another person taking care of personal grooming?: None Help from another person toileting, which includes using toliet, bedpan, or urinal?: A Little Help from  another person bathing (including washing, rinsing, drying)?: A Little Help from another person to put on and taking off regular upper body clothing?: None Help from another person to put on and taking off regular lower body clothing?: A Little 6 Click Score: 21   End of Session Equipment Utilized During Treatment: Gait belt;Rolling walker (2 wheels) Nurse Communication: Mobility status  Activity Tolerance: Patient tolerated treatment well Patient left: in bed;with call bell/phone within reach;with family/visitor present  OT Visit Diagnosis: Other abnormalities of gait and mobility  (R26.89);Repeated falls (R29.6);Muscle weakness (generalized) (M62.81)                Time: 1610-9604 OT Time Calculation (min): 30 min Charges:  OT General Charges $OT Visit: 1 Visit OT Evaluation $OT Eval Low Complexity: 1 Low OT Treatments $Self Care/Home Management : 8-22 mins  Arman Filter., MPH, MS, OTR/L ascom [phone] 05/14/23, 4:22 PM

## 2023-05-15 DIAGNOSIS — K56609 Unspecified intestinal obstruction, unspecified as to partial versus complete obstruction: Secondary | ICD-10-CM | POA: Diagnosis not present

## 2023-05-15 DIAGNOSIS — E119 Type 2 diabetes mellitus without complications: Secondary | ICD-10-CM | POA: Diagnosis not present

## 2023-05-15 DIAGNOSIS — I10 Essential (primary) hypertension: Secondary | ICD-10-CM | POA: Diagnosis not present

## 2023-05-15 LAB — BASIC METABOLIC PANEL
Anion gap: 11 (ref 5–15)
BUN: 12 mg/dL (ref 8–23)
CO2: 26 mmol/L (ref 22–32)
Calcium: 9 mg/dL (ref 8.9–10.3)
Chloride: 103 mmol/L (ref 98–111)
Creatinine, Ser: 0.67 mg/dL (ref 0.44–1.00)
GFR, Estimated: >60 mL/min (ref >60)
Glucose, Bld: 166 mg/dL — ABNORMAL HIGH (ref 70–99)
Potassium: 3.3 mmol/L — ABNORMAL LOW (ref 3.5–5.1)
Sodium: 140 mmol/L (ref 135–145)

## 2023-05-15 LAB — GLUCOSE, CAPILLARY
Glucose-Capillary: 146 mg/dL — ABNORMAL HIGH (ref 70–99)
Glucose-Capillary: 143 mg/dL — ABNORMAL HIGH (ref 70–99)
Glucose-Capillary: 130 mg/dL — ABNORMAL HIGH (ref 70–99)

## 2023-05-15 LAB — MAGNESIUM: Magnesium: 2.2 mg/dL (ref 1.7–2.4)

## 2023-05-15 MED ORDER — POTASSIUM CHLORIDE CRYS ER 20 MEQ PO TBCR
40.0000 meq | EXTENDED_RELEASE_TABLET | ORAL | Status: AC
  Administered 2023-05-15 (×2): 40 meq via ORAL
  Filled 2023-05-15 (×2): qty 2

## 2023-05-15 NOTE — Discharge Summary (Signed)
Physician Discharge Summary   Patient: Denise Case MRN: [id number] DOB: [birth year]  Admit date:     05/12/2023  Discharge date: 05/15/23  Discharge Physician: Marrion Coy   PCP: Irven Coe, MD   Recommendations at discharge:   Follow-up with PCP 1 week.  Discharge Diagnoses: Principal Problem:   Abdominal pain / SBO Active Problems:   SBO (small bowel obstruction) (HCC)   Hypothyroidism   Essential hypertension   Diabetes mellitus type II, non insulin dependent (HCC)   Wheezing on auscultation / COPD / Tobacco abuse   Hypokalemia Morbid obesity with BMI 47.15 Resolved Problems:   * No resolved hospital problems. *  Hospital Course: Denise Case is a 67 year old female with history of COPD, depression, type 2 diabetes, NASH, essential hypertension, dyslipidemia, hypothyroidism, morbid obesity, prior abdominal surgery with prior SBO who present to the hospital with worsening abdominal pain and nausea vomiting.  Abdominal film and a CT scan were performed, showed significant small bowel obstruction.  He is seen by general surgery, was placed on NG suction, IV fluids. Patient had a bowel movement with improved symptoms, start liquid diet on 6/13. Patient continue to improve, general surgery has cleared patient for discharge. Assessment and Plan:  Abdominal pain / SBO SBO appears to be secondary to patient from prior surgery. Condition improving, diet started.  Patient has been tolerating diet, medically stable to be discharged.   Hypokalemia  Potassium still low after added potassium into fluids.  Will give 80 mEq of oral KCl before discharge.     Hypothyroidism Resume Synthroid.   Wheezing on auscultation / COPD / Tobacco abuse As needed bronchodilator.   Diabetes mellitus type II, non insulin dependent (HCC) Continue sliding scale insulin.     Essential hypertension Continue to monitor.   Morbid obesity with BMI 49.84 Diet and exercise advised.       Consultants: General surgery Procedures performed: None  Disposition: Home Diet recommendation:  Discharge Diet Orders (From admission, onward)     Start     Ordered   05/15/23 0000  Diet Carb Modified        05/15/23 1002           Carb modified diet DISCHARGE MEDICATION: Allergies as of 05/15/2023       Reactions   Montelukast Sodium Anaphylaxis, Swelling   Doxycycline Other (See Comments)   Strips lining off of top of tongue, like thrush   Hydrocodone-acetaminophen Other (See Comments)   Migraines (high doses)        Medication List     TAKE these medications    acetaminophen 500 MG tablet Commonly known as: TYLENOL Take 1,000 mg by mouth every 6 (six) hours as needed for moderate pain.   albuterol 108 (90 Base) MCG/ACT inhaler Commonly known as: VENTOLIN HFA Inhale 2 puffs into the lungs every 6 (six) hours as needed for wheezing or shortness of breath.   allopurinol 300 MG tablet Commonly known as: ZYLOPRIM Take 300 mg by mouth daily.   ALPRAZolam 1 MG tablet Commonly known as: XANAX Take 1 mg by mouth 3 (three) times daily.   atorvastatin 10 MG tablet Commonly known as: LIPITOR Take 10 mg by mouth daily.   celecoxib 200 MG capsule Commonly known as: CELEBREX Take 200 mg by mouth daily as needed for mild pain.   Cinnamon 500 MG Tabs Take 2,000 mg by mouth daily.   cyanocobalamin 1000 MCG tablet Commonly known as: VITAMIN B12 Take 1,000 mcg by mouth daily.  cyclobenzaprine 10 MG tablet Commonly known as: FLEXERIL Take 10 mg by mouth 3 (three) times daily.   ergocalciferol 1.25 MG (50000 UT) capsule Commonly known as: VITAMIN D2 Take 50,000 Units by mouth every Friday.   fish oil-omega-3 fatty acids 1000 MG capsule Take 1 g by mouth 2 (two) times daily.   FLUoxetine 20 MG capsule Commonly known as: PROZAC Take 20 mg by mouth daily.   furosemide 20 MG tablet Commonly known as: LASIX Take 50 mg by mouth daily.   gabapentin 800  MG tablet Commonly known as: NEURONTIN Take 800 mg by mouth 3 (three) times daily.   glipiZIDE 5 MG tablet Commonly known as: GLUCOTROL Take 5 mg by mouth daily before breakfast.   levothyroxine 75 MCG tablet Commonly known as: Synthroid Take 3 tablets (225 mcg total) by mouth daily before breakfast.   Melatonin 10 MG Caps Take 20 mg by mouth at bedtime.   MINERAL ICE EX Apply 1 Application topically daily as needed (pain).   MULTIVITAMIN GUMMIES ADULT PO Take 1 each by mouth daily.   ondansetron 8 MG disintegrating tablet Commonly known as: ZOFRAN-ODT Take 8 mg by mouth every 8 (eight) hours as needed for vomiting or nausea.   OVER THE COUNTER MEDICATION Take 1 tablet by mouth 2 (two) times daily. Striction D supplement   OVER THE COUNTER MEDICATION Take 2 capsules by mouth daily. Natural Cleanser otc supplement   Ozempic (1 MG/DOSE) 2 MG/1.5ML Sopn Generic drug: Semaglutide (1 MG/DOSE) Inject 1 mg into the skin every Sunday.   pantoprazole 40 MG tablet Commonly known as: PROTONIX Take 40 mg by mouth daily.   pseudoephedrine-acetaminophen 30-500 MG Tabs tablet Commonly known as: TYLENOL SINUS Take 2 tablets by mouth as needed (congestion).   theophylline 300 MG 24 hr capsule Commonly known as: THEO-24 Take 300 mg by mouth daily.   traMADol 50 MG tablet Commonly known as: ULTRAM Take 50 mg by mouth in the morning, at noon, and at bedtime.   TURMERIC PO Take 1 tablet by mouth daily.   vitamin C 1000 MG tablet Take 1,000 mg by mouth 3 (three) times daily.   Vitamin D3 50 MCG (2000 UT) capsule Take 2,000 Units by mouth daily.        Follow-up Information     Irven Coe, MD Follow up in 1 week(s).   Specialty: Family Medicine Contact information: [address] [place] [address] [phone]                Discharge Exam: Ceasar Mons Weights   05/12/23 2023 05/14/23 0349 05/15/23 0414  Weight: 134 kg 131.7 kg 124.6 kg    General exam: Appears calm and comfortable  Respiratory system: Clear to auscultation. Respiratory effort normal. Cardiovascular system: S1 & S2 heard, RRR. No JVD, murmurs, rubs, gallops or clicks. No pedal edema. Gastrointestinal system: Abdomen is nondistended, soft and nontender. No organomegaly or masses felt. Normal bowel sounds heard. Central nervous system: Alert and oriented. No focal neurological deficits. Extremities: Symmetric 5 x 5 power. Skin: No rashes, lesions or ulcers Psychiatry: Judgement and insight appear normal. Mood & affect appropriate.    Condition at discharge: good  The results of significant diagnostics from this hospitalization (including imaging, microbiology, ancillary and laboratory) are listed below for reference.   Imaging Studies: DG Abd Portable 1V-Small Bowel Obstruction Protocol-24 hr delay  Result Date: 05/14/2023 CLINICAL DATA:  27 hour small-bowel obstruction follow-up EXAM: PORTABLE ABDOMEN - 1 VIEW COMPARISON:  05/13/2023 FINDINGS: Gastric catheter remains in the stomach. Previously administered contrast lies within the colon although persistent small bowel dilatation is noted. These changes are consistent with a partial small bowel obstruction. No free air is noted. No bony abnormality is seen. IMPRESSION: Changes consistent with partial small bowel obstruction. Electronically Signed   By: Alcide Clever M.D.   On: 05/14/2023 00:39   DG Abd Portable 1V-Small Bowel Obstruction Protocol-initial, 8 hr delay  Result Date: 05/13/2023 CLINICAL DATA:  67 year old female with history of small-bowel obstruction. EXAM: PORTABLE ABDOMEN - 1 VIEW COMPARISON:  05/12/2023. FINDINGS: Nasogastric tube noted in the stomach with tip in the antral pre-pyloric region. Contrast material is noted in the proximal stomach. Numerous dilated loops of small bowel are noted throughout the central abdomen, measuring up to 4.5 cm in the right-side of the mid abdomen. Paucity of  colonic gas and stool. Iodinated contrast material noted in the lumen of the urinary bladder. IMPRESSION: 1. Support apparatus, as above. 2. Oral contrast material noted predominantly in the stomach. 3. Bowel-gas pattern remains compatible with small bowel obstruction, as above. Electronically Signed   By: Trudie Reed M.D.   On: 05/13/2023 06:09   DG Abd Portable 1V-Small Bowel Protocol-Position Verification  Result Date: 05/12/2023 CLINICAL DATA:  Nasogastric tube placement. EXAM: PORTABLE ABDOMEN - 1 VIEW COMPARISON:  CT earlier today FINDINGS: Tip and side port of the enteric tube below the diaphragm in the stomach. Dilated small bowel on CT is fluid-filled and not well seen by radiograph. IMPRESSION: Tip and side port of the enteric tube below the diaphragm in the stomach. Electronically Signed   By: Narda Rutherford M.D.   On: 05/12/2023 19:56   CT ABDOMEN PELVIS W CONTRAST  Result Date: 05/12/2023 CLINICAL DATA:  Upper abdominal pain. EXAM: CT ABDOMEN AND PELVIS WITH CONTRAST TECHNIQUE: Multidetector CT imaging of the abdomen and pelvis was performed using the standard protocol following bolus administration of intravenous contrast. RADIATION DOSE REDUCTION: This exam was performed according to the departmental dose-optimization program which includes automated exposure control, adjustment of the mA and/or kV according to patient size and/or use of iterative reconstruction technique. CONTRAST:  OMNIPAQUE IOHEXOL 300 MG/ML  SOLN COMPARISON:  December 01, 2014 FINDINGS: Lower chest: No acute abnormality. Hepatobiliary: No focal liver abnormality is seen. Status post cholecystectomy. No biliary dilatation. Pancreas: Unremarkable. No pancreatic ductal dilatation or surrounding inflammatory changes. Spleen: Subcentimeter calcified granulomas are seen within the spleen. Adrenals/Urinary Tract: Adrenal glands are unremarkable. Kidneys are normal, without renal calculi, focal lesion, or  hydronephrosis. A small amount of air is seen within the lumen of a moderately distended urinary bladder. Stomach/Bowel: Stomach is within normal limits. Appendix appears normal. Multiple dilated small bowel loops are seen throughout the abdomen and pelvis (maximum small bowel diameter of approximately 3.2 cm). A transition zone is seen along the midline of the posterior pelvis (axial CT images 68 through 77, CT series 2). Vascular/Lymphatic: Aortic atherosclerosis. No enlarged abdominal or pelvic lymph nodes. Reproductive: Status post hysterectomy. No adnexal masses. Other: No abdominal wall hernia or abnormality. No abdominopelvic ascites. Musculoskeletal: Multilevel degenerative changes seen throughout the thoracolumbar spine. IMPRESSION: 1. Small-bowel obstruction with a transition zone along the midline of the posterior pelvis. 2. Evidence of prior cholecystectomy and prior hysterectomy. 3. Aortic atherosclerosis. Aortic Atherosclerosis (ICD10-I70.0). Electronically Signed   By: Aram Candela M.D.   On: 05/12/2023 17:21    Microbiology: Results for orders placed or performed during the hospital encounter of 07/24/22  SARS Coronavirus 2 by RT PCR (hospital order, performed in [facility] hospital lab) *cepheid single result test* Anterior Nasal Swab     Status: Abnormal   Collection Time: 07/24/22  8:23 AM   Specimen: Anterior Nasal Swab  Result Value Ref Range Status   SARS Coronavirus 2 by RT PCR POSITIVE (A) NEGATIVE Final    Comment: (NOTE) SARS-CoV-2 target nucleic acids are DETECTED  SARS-CoV-2 RNA is generally detectable in upper respiratory specimens  during the acute phase of infection.  Positive results are indicative  of the presence of the identified virus, but do not rule out bacterial infection or co-infection with other pathogens not detected by the test.  Clinical correlation with patient history and  other diagnostic information is necessary to determine patient infection  status.  The expected result is negative.  Fact Sheet for Patients:   RoadLapTop.co.za   Fact Sheet for Healthcare Providers:   http://kim-miller.com/    This test is not yet approved or cleared by the [place] FDA and  has been authorized for detection and/or diagnosis of SARS-CoV-2 by FDA under an Emergency Use Authorization (EUA).  This EUA will remain in effect (meaning this test can be used) for the duration of  the COVID-19 declaration under Section 564(b)(1)  of the Act, 21 U.S.C. section 360-bbb-3(b)(1), unless the authorization is terminated or revoked sooner.   Performed at [facility], [address] [place], [place] [address]     Labs: CBC: Recent Labs  Lab 05/12/23 1511 05/13/23 0630  WBC 14.2* 11.8*  HGB 14.3 13.4  HCT 43.6 41.0  MCV 93.6 94.0  PLT 299 274   Basic Metabolic Panel: Recent Labs  Lab 05/12/23 1511 05/13/23 0630 05/14/23 0534 05/15/23 0507  NA 140 143 142 140  K 3.8 3.6 3.4* 3.3*  CL 101 104 103 103  CO2 27 29 28 26   GLUCOSE 192* 160* 167* 166*  BUN 17 14 13 12   CREATININE 0.76 0.61 0.64 0.67  CALCIUM 9.8 8.2* 9.1 9.0  MG  --   --  2.3 2.2   Liver Function Tests: Recent Labs  Lab 05/12/23 1511 05/13/23 0630  AST 46* 37  ALT 32 34  ALKPHOS 65 61  BILITOT 0.4 0.5  PROT 8.1 7.1  ALBUMIN 3.9 3.4*   CBG: Recent Labs  Lab 05/14/23 1617 05/14/23 2039 05/14/23 2329 05/15/23 0418 05/15/23 0819  GLUCAP 147* 140* 151* 146* 143*    Discharge time spent: greater than 30 minutes.  Signed: Marrion Coy, MD [facility] 05/15/2023

## 2023-05-15 NOTE — TOC Initial Note (Signed)
Transition of Care [facility]) - Initial/Assessment Note    Patient Details  Name: Denise Case MRN: [id number] Date of Birth: [birth year]  Transition of Care [facility]) CM/SW Contact:    Liliana Cline, LCSW Phone Number: 05/15/2023, 10:32 AM  Clinical Narrative:                 Patient to DC home today. Recommendations for Home Health. CSW spoke to patient. Patient is from home with her husband. Patient states her husband goes to radiation daily so she has limited transportation options. Her daughter provides transport when needed.  PCP is Dr. Lewie Chamber. Pharmacy is [facility]. Patient has DME at home. Uses [facility] at [employer]. Has o2 through [employer].  Patient is agreeable to [facility] - no agency preferences. Referral accepted by Elnita Maxwell with [facility] for [facility], PT, and OT. Asked MD for orders. Notified patient. No additional TOC needs identified prior to DC today.  Expected Discharge Plan: Home w Home Health Services Barriers to Discharge: Continued Medical Work up   Patient Goals and CMS Choice Patient states their goals for this hospitalization and ongoing recovery are:: home with home health CMS Medicare.gov Compare Post Acute Care list provided to:: Patient Choice offered to / list presented to : Patient  ownership interest in [facility].provided to:: Patient    Expected Discharge Plan and Services       Living arrangements for the past 2 months: Single Family Home Expected Discharge Date: 05/15/23                         HH Arranged: PT, OT, RN HH Agency: [facility] Date [facility] Agency Contacted: 05/15/23   Representative spoke with at [facility] Agency: Elnita Maxwell  Prior Living Arrangements/Services Living arrangements for the past 2 months: Single Family Home Lives with:: Spouse Patient language and need for interpreter reviewed:: Yes Do you feel safe going back to the place where you live?: Yes      Need for Family Participation in Patient Care:  Yes (Comment) Care giver support system in place?: Yes (comment) Current home services: DME Criminal Activity/Legal Involvement Pertinent to Current Situation/Hospitalization: No - Comment as needed  Activities of Daily Living Home Assistive Devices/Equipment: Dan Humphreys (specify type) ADL Screening (condition at time of admission) Patient's cognitive ability adequate to safely complete daily activities?: Yes Is the patient deaf or have difficulty hearing?: No Does the patient have difficulty seeing, even when wearing glasses/contacts?: No Does the patient have difficulty concentrating, remembering, or making decisions?: No Patient able to express need for assistance with ADLs?: No Does the patient have difficulty dressing or bathing?: No Independently performs ADLs?: Yes (appropriate for developmental age) Does the patient have difficulty walking or climbing stairs?: No Weakness of Legs: None Weakness of Arms/Hands: None  Permission Sought/Granted Permission sought to share information with : Facility Industrial/product designer granted to share information with : Yes, Verbal Permission Granted     Permission granted to share info w AGENCY: HH agencies        Emotional Assessment       Orientation: : Oriented to Situation, Oriented to Self, Oriented to Place, Oriented to  Time Alcohol / Substance Use: Not Applicable Psych Involvement: No (comment)  Admission diagnosis:  Small bowel obstruction (HCC) [K56.609] Abdominal pain [R10.9] SBO (small bowel obstruction) (HCC) [K56.609] Patient Active Problem List   Diagnosis Date Noted   Hypokalemia 05/14/2023   Abdominal pain / SBO 05/12/2023  Wheezing on auscultation / COPD / Tobacco abuse 05/12/2023   Tobacco abuse counseling 06/17/2019   Morbid obesity (HCC) 06/16/2019   Mixed hyperlipidemia 06/16/2019   Essential hypertension 06/16/2019   Exertional chest pain 06/16/2019   Diabetes mellitus type II, non insulin  dependent (HCC) 06/16/2019   Depression 06/16/2019   Urinary retention 12/03/2014   Opiate dependence (HCC) 12/03/2014   Benzodiazepine dependence (HCC) 12/03/2014   Acute upper respiratory infection 12/03/2014   Hypothyroidism 12/02/2014   Chronic pain syndrome 12/02/2014   History of asthma 12/02/2014   Fatty liver 12/02/2014   SBO (small bowel obstruction) (HCC) 12/01/2014   Chronic cholecystitis s/p lap chole with IOC 05/25/14 05/25/2014   CAP (community acquired pneumonia) 05/04/2014   RUQ abdominal pain 06/20/2013   PCP:  Irven Coe, MD Pharmacy:   [id number] - [place], [place] - [address] AT [facility] [address] [place] [address] Phone: [phone] Fax: [phone]  [id number] [place], [place] - [address] AT [facility] OF [address] [place] [facility] [address] Phone: [phone] Fax: [phone]     Social Determinants of Health (SDOH) Social History: SDOH Screenings   Food Insecurity: No Food Insecurity (05/12/2023)  Housing: Low Risk  (05/12/2023)  Transportation Needs: No Transportation Needs (05/12/2023)  Utilities: Not At Risk (05/12/2023)  Tobacco Use: High Risk (05/12/2023)   SDOH Interventions:     Readmission Risk Interventions     No data to display

## 2023-05-19 ENCOUNTER — Emergency Department (HOSPITAL_COMMUNITY): Payer: 59

## 2023-05-19 ENCOUNTER — Emergency Department (HOSPITAL_COMMUNITY)
Admission: EM | Admit: 2023-05-19 | Discharge: 2023-05-19 | Disposition: A | Discharge status: Home / Self Care | Payer: 59 | Attending: Emergency Medicine | Admitting: Emergency Medicine

## 2023-05-19 ENCOUNTER — Other Ambulatory Visit: Payer: Self-pay

## 2023-05-19 ENCOUNTER — Encounter (HOSPITAL_COMMUNITY): Payer: Self-pay

## 2023-05-19 DIAGNOSIS — Z79899 Other long term (current) drug therapy: Secondary | ICD-10-CM | POA: Diagnosis not present

## 2023-05-19 DIAGNOSIS — Z7984 Long term (current) use of oral hypoglycemic drugs: Secondary | ICD-10-CM | POA: Diagnosis not present

## 2023-05-19 DIAGNOSIS — R1013 Epigastric pain: Secondary | ICD-10-CM

## 2023-05-19 LAB — I-STAT CHEM 8, ED
BUN: 4 mg/dL — ABNORMAL LOW (ref 8–23)
Calcium, Ion: 1.08 mmol/L — ABNORMAL LOW (ref 1.15–1.40)
Chloride: 102 mmol/L (ref 98–111)
Creatinine, Ser: 0.7 mg/dL (ref 0.44–1.00)
Glucose, Bld: 119 mg/dL — ABNORMAL HIGH (ref 70–99)
HCT: 46 % (ref 36.0–46.0)
Hemoglobin: 15.6 g/dL — ABNORMAL HIGH (ref 12.0–15.0)
Potassium: 3.4 mmol/L — ABNORMAL LOW (ref 3.5–5.1)
Sodium: 139 mmol/L (ref 135–145)
TCO2: 25 mmol/L (ref 22–32)

## 2023-05-19 LAB — COMPREHENSIVE METABOLIC PANEL
ALT: 35 U/L (ref 0–44)
AST: 31 U/L (ref 15–41)
Albumin: 3.7 g/dL (ref 3.5–5.0)
Alkaline Phosphatase: 60 U/L (ref 38–126)
Anion gap: 10 (ref 5–15)
BUN: 6 mg/dL — ABNORMAL LOW (ref 8–23)
CO2: 24 mmol/L (ref 22–32)
Calcium: 9 mg/dL (ref 8.9–10.3)
Chloride: 103 mmol/L (ref 98–111)
Creatinine, Ser: 0.61 mg/dL (ref 0.44–1.00)
GFR, Estimated: >60 mL/min (ref >60)
Glucose, Bld: 118 mg/dL — ABNORMAL HIGH (ref 70–99)
Potassium: 3.1 mmol/L — ABNORMAL LOW (ref 3.5–5.1)
Sodium: 137 mmol/L (ref 135–145)
Total Bilirubin: 0.5 mg/dL (ref 0.3–1.2)
Total Protein: 7.8 g/dL (ref 6.5–8.1)

## 2023-05-19 LAB — URINALYSIS, ROUTINE W REFLEX MICROSCOPIC
Bilirubin Urine: NEGATIVE
Glucose, UA: NEGATIVE mg/dL
Hgb urine dipstick: NEGATIVE
Ketones, ur: NEGATIVE mg/dL
Nitrite: POSITIVE — AB
Protein, ur: NEGATIVE mg/dL
Specific Gravity, Urine: 1.009 (ref 1.005–1.030)
WBC, UA: >50 WBC/hpf (ref 0–5)
pH: 5 (ref 5.0–8.0)

## 2023-05-19 LAB — CBC
HCT: 44.5 % (ref 36.0–46.0)
Hemoglobin: 14.5 g/dL (ref 12.0–15.0)
MCH: 31.4 pg (ref 26.0–34.0)
MCHC: 32.6 g/dL (ref 30.0–36.0)
MCV: 96.3 fL (ref 80.0–100.0)
Platelets: 268 10*3/uL (ref 150–400)
RBC: 4.62 MIL/uL (ref 3.87–5.11)
RDW: 13.6 % (ref 11.5–15.5)
WBC: 12.3 10*3/uL — ABNORMAL HIGH (ref 4.0–10.5)
nRBC: 0 % (ref 0.0–0.2)

## 2023-05-19 MED ORDER — IOHEXOL 300 MG/ML  SOLN
100.0000 mL | Freq: Once | INTRAMUSCULAR | Status: AC | PRN
  Administered 2023-05-19: 100 mL via INTRAVENOUS

## 2023-05-19 MED ORDER — DICYCLOMINE HCL 20 MG PO TABS: 20.0000 mg | ORAL_TABLET | Freq: Three times a day (TID) | ORAL | 0 refills | Status: DC

## 2023-05-19 NOTE — Discharge Instructions (Addendum)
You are seen in the emergency room for abdominal pain.  The workup in the emergency room, including CAT scan is reassuring.  Given that your pain is provoked by food intake, we recommend that you proceed with clear liquid diet for the next 2 days.  Advance to simple fruits and vegetables, yogurt, rice and pasta either after.  Avoid processed foods, fried foods.  Take the medication prescribed and follow-up with Dr. Loreta Ave to see if you additional GI studies.

## 2023-05-19 NOTE — ED Notes (Signed)
Pt and family updatedon discharge paperwork, all questions answered, pt appears to be in NAD at this time

## 2023-05-19 NOTE — ED Notes (Signed)
Patient transported to CT 

## 2023-05-19 NOTE — ED Provider Notes (Signed)
[place] EMERGENCY DEPARTMENT AT [facility] Provider Note   CSN: [id number] Arrival date & time: 05/19/23  1511     History  Chief Complaint  Patient presents with   Abdominal Pain    Denise Case is a 67 y.o. female.  HPI    67 year old female comes in with chief complaint of abdominal pain.  Patient recently had admission to the hospital at [facility] for small bowel obstruction.  She comes into the emergency room today because she was feeling well in the hospital at the time of discharge, but soon after she started having postprandial pain.  Every time she eats, 20 minutes later she will have upper quadrant abdominal pain, that radiates towards the side and also at the umbilicus.  Pain is described as cramping and contraction type pain.  Pain does not radiate to the back.  The pain would last for couple of hours and then resolved.  She went to see her GI doctor today and was advised to come to the emergency room with concerns for possible early small bowel obstruction.  Patient remains constipated,  She is passing flatus. Home Medications Prior to Admission medications   Medication Sig Start Date End Date Taking? Authorizing Provider  dicyclomine (BENTYL) 20 MG tablet Take 1 tablet (20 mg total) by mouth 4 (four) times daily -  before meals and at bedtime. 05/19/23  Yes Derwood Kaplan, MD  acetaminophen (TYLENOL) 500 MG tablet Take 1,000 mg by mouth every 6 (six) hours as needed for moderate pain.    [provider]  albuterol (VENTOLIN HFA) 108 (90 Base) MCG/ACT inhaler Inhale 2 puffs into the lungs every 6 (six) hours as needed for wheezing or shortness of breath. 07/24/22   Elson Areas, PA-C  allopurinol (ZYLOPRIM) 300 MG tablet Take 300 mg by mouth daily. 01/28/19   [provider]  ALPRAZolam Prudy Feeler) 1 MG tablet Take 1 mg by mouth 3 (three) times daily.    [provider]  Ascorbic Acid (VITAMIN C) 1000 MG tablet Take 1,000  mg by mouth 3 (three) times daily.    [provider]  atorvastatin (LIPITOR) 10 MG tablet Take 10 mg by mouth daily.    [provider]  celecoxib (CELEBREX) 200 MG capsule Take 200 mg by mouth daily as needed for mild pain. 04/08/23   [provider]  Cholecalciferol (VITAMIN D3) 50 MCG (2000 UT) capsule Take 2,000 Units by mouth daily. 08/29/19   [provider]  Cinnamon 500 MG TABS Take 2,000 mg by mouth daily.    [provider]  cyanocobalamin (VITAMIN B12) 1000 MCG tablet Take 1,000 mcg by mouth daily. 08/29/19   [provider]  cyclobenzaprine (FLEXERIL) 10 MG tablet Take 10 mg by mouth 3 (three) times daily.    [provider]  ergocalciferol (VITAMIN D2) 50000 UNITS capsule Take 50,000 Units by mouth every Friday.    [provider]  fish oil-omega-3 fatty acids 1000 MG capsule Take 1 g by mouth 2 (two) times daily.    [provider]  FLUoxetine (PROZAC) 20 MG capsule Take 20 mg by mouth daily.    [provider]  furosemide (LASIX) 20 MG tablet Take 50 mg by mouth daily.    [provider]  gabapentin (NEURONTIN) 800 MG tablet Take 800 mg by mouth 3 (three) times daily.     [provider]  glipiZIDE (GLUCOTROL) 5 MG tablet Take 5 mg by mouth daily  before breakfast.    [provider]  levothyroxine (SYNTHROID, LEVOTHROID) 75 MCG tablet Take 3 tablets (225 mcg total) by mouth daily before breakfast. 12/06/14   Houston Siren, MD  Melatonin 10 MG CAPS Take 20 mg by mouth at bedtime.    [provider]  Menthol, Topical Analgesic, (MINERAL ICE EX) Apply 1 Application topically daily as needed (pain).    [provider]  Multiple Vitamins-Minerals (MULTIVITAMIN GUMMIES ADULT PO) Take 1 each by mouth daily.    [provider]  ondansetron (ZOFRAN-ODT) 8 MG disintegrating tablet Take 8 mg by mouth every 8 (eight) hours as needed for vomiting or nausea. 07/16/21    [provider]  OVER THE COUNTER MEDICATION Take 1 tablet by mouth 2 (two) times daily. Striction D supplement    [provider]  OVER THE COUNTER MEDICATION Take 2 capsules by mouth daily. Natural Cleanser otc supplement    [provider]  pantoprazole (PROTONIX) 40 MG tablet Take 40 mg by mouth daily. 01/25/19   [provider]  pseudoephedrine-acetaminophen (TYLENOL SINUS) 30-500 MG TABS Take 2 tablets by mouth as needed (congestion).    [provider]  Semaglutide, 1 MG/DOSE, (OZEMPIC, 1 MG/DOSE,) 2 MG/1.5ML SOPN Inject 1 mg into the skin every Sunday.    [provider]  theophylline (THEO-24) 300 MG 24 hr capsule Take 300 mg by mouth daily.    [provider]  traMADol (ULTRAM) 50 MG tablet Take 50 mg by mouth in the morning, at noon, and at bedtime.    [provider]  TURMERIC PO Take 1 tablet by mouth daily.    [provider]      Allergies    Montelukast sodium, Doxycycline, Hydrocodone-acetaminophen, and Morphine    Review of Systems   Review of Systems  All other systems reviewed and are negative.   Physical Exam Updated Vital Signs BP 112/89   Pulse 85   Temp 98.8 F (37.1 C) (Oral)   Resp 19   Ht 5\' 4"  (1.626 m)   Wt 127 kg   SpO2 91%   BMI 48.06 kg/m  Physical Exam Vitals and nursing note reviewed.  Constitutional:      Appearance: She is well-developed.  HENT:     Head: Normocephalic and atraumatic.  Eyes:     Extraocular Movements: Extraocular movements intact.  Cardiovascular:     Rate and Rhythm: Normal rate.  Pulmonary:     Effort: Pulmonary effort is normal.  Abdominal:     General: There is distension.     Tenderness: There is abdominal tenderness in the right upper quadrant, epigastric area and left upper quadrant.     Hernia: No hernia is present.     Comments: Epigastric tenderness, guarding but no rebound  Musculoskeletal:     Cervical back: Normal range of  motion and neck supple.  Skin:    General: Skin is dry.  Neurological:     Mental Status: She is alert and oriented to person, place, and time.     ED Results / Procedures / Treatments   Labs (all labs ordered are listed, but only abnormal results are displayed) Labs Reviewed  COMPREHENSIVE METABOLIC PANEL - Abnormal; Notable for the following components:      Result Value   Potassium 3.1 (*)    Glucose, Bld 118 (*)    BUN 6 (*)    All other components within normal limits  CBC - Abnormal; Notable for the following  components:   WBC 12.3 (*)    All other components within normal limits  URINALYSIS, ROUTINE W REFLEX MICROSCOPIC - Abnormal; Notable for the following components:   APPearance HAZY (*)    Nitrite POSITIVE (*)    Leukocytes,Ua LARGE (*)    Bacteria, UA MANY (*)    All other components within normal limits  I-STAT CHEM 8, ED - Abnormal; Notable for the following components:   Potassium 3.4 (*)    BUN 4 (*)    Glucose, Bld 119 (*)    Calcium, Ion 1.08 (*)    Hemoglobin 15.6 (*)    All other components within normal limits    EKG None  Radiology CT ABDOMEN PELVIS W CONTRAST  Result Date: 05/19/2023 CLINICAL DATA:  Concern for bowel obstruction. EXAM: CT ABDOMEN AND PELVIS WITH CONTRAST TECHNIQUE: Multidetector CT imaging of the abdomen and pelvis was performed using the standard protocol following bolus administration of intravenous contrast. RADIATION DOSE REDUCTION: This exam was performed according to the departmental dose-optimization program which includes automated exposure control, adjustment of the mA and/or kV according to patient size and/or use of iterative reconstruction technique. CONTRAST:  OMNIPAQUE IOHEXOL 300 MG/ML  SOLN COMPARISON:  CT abdomen pelvis dated 05/12/2023. FINDINGS: Lower chest: Bibasilar linear atelectasis/scarring. The visualized lung bases are otherwise clear. No intra-abdominal free air or free fluid. Hepatobiliary: Apparent  fatty liver. No biliary dilatation. Cholecystectomy. No retained calcified stone noted in the central CBD. Pancreas: Unremarkable. No pancreatic ductal dilatation or surrounding inflammatory changes. Spleen: Normal in size without focal abnormality. Adrenals/Urinary Tract: The adrenal glands are unremarkable. There is no hydronephrosis on either side. There is symmetric enhancement and excretion of contrast by both kidneys. The visualized ureters and urinary bladder appear unremarkable. Stomach/Bowel: There is no bowel obstruction or active inflammation. The appendix is normal. Vascular/Lymphatic: Mild aortoiliac atherosclerotic disease. The IVC is unremarkable. No portal venous gas. There is no adenopathy. Reproductive: Hysterectomy.  No adnexal masses Other: Small fat containing umbilical hernia. Musculoskeletal: Degenerative changes of the spine. No acute osseous pathology. IMPRESSION: 1. No acute intra-abdominal or pelvic pathology. No bowel obstruction. Normal appendix. 2. Fatty liver. 3.  Aortic Atherosclerosis (ICD10-I70.0). Electronically Signed   By: Elgie Collard M.D.   On: 05/19/2023 18:42    Procedures Procedures    Medications Ordered in ED Medications  iohexol (OMNIPAQUE) 300 MG/ML solution 100 mL (100 mLs Intravenous Contrast Given 05/19/23 1738)    ED Course/ Medical Decision Making/ A&P Clinical Course as of 05/19/23 2002  Tue May 19, 2023  2002 Urinalysis, Routine w reflex microscopic -Urine, Clean Catch(!) Patient has no cystitis, lower quadrant abdominal pain, back pain, urinary frequency.  She has no history of recurrent UTI.  Likely asymptomatic bacteriuria, we will not treat.  Patient informed to return to the ER if she starts having UTI symptoms or fevers. [AN]    Clinical Course User Index [AN] Derwood Kaplan, MD                             Medical Decision Making Amount and/or Complexity of Data Reviewed Labs: ordered.  Risk Prescription drug  management.  This patient presents to the ED with chief complaint(s) of abdominal pain with pertinent past medical history of recent admission for small obstruction.The complaint involves an extensive differential diagnosis and also carries with it a high risk of complications and morbidity.    The differential diagnosis includes : Ileus, early  small obstruction, pancreatitis, peptic ulcer disease, GI dysmotility. Patient is postcholecystectomy, therefore unlikely to be hepatobiliary process.  The initial plan is to get CT abdomen pelvis with contrast along with basic labs.   Additional history obtained: Additional history obtained from spouse Records reviewed previous admission documents  Independent labs interpretation:  The following labs were independently interpreted: CBC, metabolic profile is overall reassuring.  No profound leukocytosis.  Independent visualization and interpretation of imaging: - I independently visualized the following imaging with scope of interpretation limited to determining acute life threatening conditions related to emergency care: CT scan of the abdomen pelvis, which revealed distended bowel, but there is no gastric distention and no air-fluid level on the scout films.  Per radiologist, no evidence of small bowel obstruction.  Consultation: - Consulted or discussed management/test interpretation with external professional: I did consult Dr. Loreta Ave, patient's GI.  We discussed patient's CT findings.  She recommends that patient be discharged with tramadol and she can see her in the clinic again.  Upon review of [place] narcotic database, patient gets regular tramadol prescription by PCP anyways.  Will discharge with Bentyl.  Advise clear liquid diet for the next 2 days.    Final Clinical Impression(s) / ED Diagnoses Final diagnoses:  Epigastric pain    Rx / DC Orders ED Discharge Orders          Ordered    dicyclomine (BENTYL) 20 MG tablet  3  times daily before meals & bedtime        05/19/23 1929              Derwood Kaplan, MD 05/19/23 2003

## 2023-05-19 NOTE — ED Triage Notes (Signed)
Pt referred by PCP for a CT scan of Abd. Pt states she is experiencing abd pain/pressure, and diarrhea, worse while eating. Pt seen recently at [facility] for bowel obstruction, pt states that s/s feel similar to her previous admission.

## 2023-06-09 ENCOUNTER — Other Ambulatory Visit: Payer: Self-pay

## 2023-06-09 DIAGNOSIS — Z87891 Personal history of nicotine dependence: Secondary | ICD-10-CM

## 2023-06-09 DIAGNOSIS — F17210 Nicotine dependence, cigarettes, uncomplicated: Secondary | ICD-10-CM

## 2023-06-09 DIAGNOSIS — Z122 Encounter for screening for malignant neoplasm of respiratory organs: Secondary | ICD-10-CM

## 2023-07-21 ENCOUNTER — Encounter: Payer: Self-pay | Admitting: Acute Care

## 2023-07-21 ENCOUNTER — Ambulatory Visit: Payer: Medicare Other | Admitting: Acute Care

## 2023-07-21 DIAGNOSIS — F17210 Nicotine dependence, cigarettes, uncomplicated: Secondary | ICD-10-CM | POA: Diagnosis not present

## 2023-07-21 NOTE — Patient Instructions (Signed)

## 2023-07-21 NOTE — Progress Notes (Signed)
Virtual Visit via Telephone Note  I connected with Denise Case on 07/21/23 at  1:30 PM EDT by telephone and verified that I am speaking with the correct person using two identifiers.  Location: Patient:  At home Provider: [address], [place], [place], [address]    I discussed the limitations, risks, security and privacy concerns of performing an evaluation and management service by telephone and the availability of in person appointments. I also discussed with the patient that there may be a patient responsible charge related to this service. The patient expressed understanding and agreed to proceed.    Shared Decision Making Visit Lung Cancer Screening Program [phone])   Eligibility: Age 67 y.o. Pack Years Smoking History Calculation 42 pack year smoking history (# packs/per year x # years smoked) Recent History of coughing up blood  no Unexplained weight loss? no ( >Than 15 pounds within the last 6 months ) Prior History Lung / other cancer no (Diagnosis within the last 5 years already requiring surveillance chest CT Scans). Smoking Status Current Smoker Former Smokers: Years since quit: NA  Quit Date: NA  Visit Components: Discussion included one or more decision making aids. yes Discussion included risk/benefits of screening. yes Discussion included potential follow up diagnostic testing for abnormal scans. yes Discussion included meaning and risk of over diagnosis. yes Discussion included meaning and risk of False Positives. yes Discussion included meaning of total radiation exposure. yes  Counseling Included: Importance of adherence to annual lung cancer LDCT screening. yes Impact of comorbidities on ability to participate in the program. yes Ability and willingness to under diagnostic treatment. yes  Smoking Cessation Counseling: Current Smokers:  Discussed importance of smoking cessation. yes Information about tobacco cessation classes and  interventions provided to patient. yes Patient provided with "ticket" for LDCT Scan. yes Symptomatic Patient. no  Counseling NA Diagnosis Code: Tobacco Use Z72.0 Asymptomatic Patient yes  Counseling (Intermediate counseling: > three minutes counseling) [id number] Former Smokers:  Discussed the importance of maintaining cigarette abstinence. yes Diagnosis Code: Personal History of Nicotine Dependence. Z30.865 Information about tobacco cessation classes and interventions provided to patient. Yes Patient provided with "ticket" for LDCT Scan. yes Written Order for Lung Cancer Screening with LDCT placed in Epic. Yes (CT Chest Lung Cancer Screening Low Dose W/O CM) [id number] Z12.2-Screening of respiratory organs Z87.891-Personal history of nicotine dependence  I have spent 25 minutes of face to face/ virtual visit   time with Denise Case discussing the risks and benefits of lung cancer screening. We viewed / discussed a power point together that explained in detail the above noted topics. We paused at intervals to allow for questions to be asked and answered to ensure understanding.We discussed that the single most powerful action that she can take to decrease her risk of developing lung cancer is to quit smoking. We discussed whether or not she is ready to commit to setting a quit date. We discussed options for tools to aid in quitting smoking including nicotine replacement therapy, non-nicotine medications, support groups, Quit Smart classes, and behavior modification. We discussed that often times setting smaller, more achievable goals, such as eliminating 1 cigarette a day for a week and then 2 cigarettes a day for a week can be helpful in slowly decreasing the number of cigarettes smoked. This allows for a sense of accomplishment as well as providing a clinical benefit. I provided  her  with smoking cessation  information  with contact information for community resources, classes, free nicotine  replacement  therapy, and access to mobile apps, text messaging, and on-line smoking cessation help. I have also provided  her  the office contact information in the event she needs to contact me, or the screening staff. We discussed the time and location of the scan, and that either Abigail Miyamoto RN, Karlton Lemon, RN  or I will call / send a letter with the results within 24-72 hours of receiving them. The patient verbalized understanding of all of  the above and had no further questions upon leaving the office. They have my contact information in the event they have any further questions.  I spent 3 minutes counseling on smoking cessation and the health risks of continued tobacco abuse.  I explained to the patient that there has been a high incidence of coronary artery disease noted on these exams. I explained that this is a non-gated exam therefore degree or severity cannot be determined. This patient is on statin therapy. I have asked the patient to follow-up with their PCP regarding any incidental finding of coronary artery disease and management with diet or medication as their PCP  feels is clinically indicated. The patient verbalized understanding of the above and had no further questions upon completion of the visit.    Bevelyn Ngo, NP 07/21/2023

## 2023-07-22 ENCOUNTER — Ambulatory Visit: Admission: RE | Admit: 2023-07-22 | Payer: 59 | Source: Ambulatory Visit

## 2023-07-23 ENCOUNTER — Telehealth: Payer: Self-pay | Admitting: Acute Care

## 2023-07-23 NOTE — Telephone Encounter (Signed)
Unable to reach by phone to reschedule LDCT.  Patient has completed sdmv but was unable to keep appt for LDCT due to illness.  Provider requests reschedule in 2 weeks from 07/22/23 sdmv. Schedule appt 08/06/23 or later.  Left patient VM to call for scheduling

## 2023-09-10 ENCOUNTER — Ambulatory Visit
Admission: RE | Admit: 2023-09-10 | Discharge: 2023-09-10 | Disposition: A | Discharge status: Home / Self Care | Payer: Medicare Other | Source: Ambulatory Visit | Attending: Acute Care | Admitting: Acute Care

## 2023-09-10 DIAGNOSIS — Z122 Encounter for screening for malignant neoplasm of respiratory organs: Secondary | ICD-10-CM

## 2023-09-10 DIAGNOSIS — F17210 Nicotine dependence, cigarettes, uncomplicated: Secondary | ICD-10-CM

## 2023-09-10 DIAGNOSIS — Z87891 Personal history of nicotine dependence: Secondary | ICD-10-CM

## 2023-09-28 ENCOUNTER — Telehealth: Payer: Self-pay | Admitting: *Deleted

## 2023-09-28 DIAGNOSIS — Z87891 Personal history of nicotine dependence: Secondary | ICD-10-CM

## 2023-09-28 DIAGNOSIS — R911 Solitary pulmonary nodule: Secondary | ICD-10-CM

## 2023-09-28 NOTE — Telephone Encounter (Signed)
Spoke with pt and reviewed LDCT results. Small nodules noted that need to be followed up in 6 months with a repeat CT. Pt verbalized understanding. Results/ plans faxed to PCP. Order placed for 6 month nodule f/u CT.

## 2023-09-28 NOTE — Telephone Encounter (Signed)
Left message for pt to call back to discuss lung screening CT results.  

## 2023-10-27 ENCOUNTER — Ambulatory Visit
Admission: RE | Admit: 2023-10-27 | Discharge: 2023-10-27 | Disposition: A | Discharge status: Home / Self Care | Payer: Medicare Other | Source: Ambulatory Visit | Attending: Family Medicine | Admitting: Family Medicine

## 2023-10-27 DIAGNOSIS — Z13820 Encounter for screening for osteoporosis: Secondary | ICD-10-CM

## 2023-12-01 ENCOUNTER — Ambulatory Visit
Admission: RE | Admit: 2023-12-01 | Discharge: 2023-12-01 | Disposition: A | Discharge status: Home / Self Care | Payer: Medicare Other | Source: Ambulatory Visit | Attending: Physician Assistant | Admitting: Physician Assistant

## 2023-12-01 ENCOUNTER — Other Ambulatory Visit: Payer: Self-pay | Admitting: Physician Assistant

## 2023-12-01 DIAGNOSIS — R0602 Shortness of breath: Secondary | ICD-10-CM

## 2024-03-10 ENCOUNTER — Ambulatory Visit
Admission: RE | Admit: 2024-03-10 | Discharge: 2024-03-10 | Disposition: A | Discharge status: Home / Self Care | Source: Ambulatory Visit | Attending: Acute Care | Admitting: Acute Care

## 2024-03-10 DIAGNOSIS — Z87891 Personal history of nicotine dependence: Secondary | ICD-10-CM

## 2024-03-10 DIAGNOSIS — R911 Solitary pulmonary nodule: Secondary | ICD-10-CM

## 2024-03-22 ENCOUNTER — Other Ambulatory Visit: Payer: Self-pay | Admitting: Acute Care

## 2024-03-22 DIAGNOSIS — Z87891 Personal history of nicotine dependence: Secondary | ICD-10-CM

## 2024-03-22 DIAGNOSIS — Z122 Encounter for screening for malignant neoplasm of respiratory organs: Secondary | ICD-10-CM

## 2024-03-22 DIAGNOSIS — F17210 Nicotine dependence, cigarettes, uncomplicated: Secondary | ICD-10-CM

## 2024-04-04 ENCOUNTER — Other Ambulatory Visit: Payer: Self-pay | Admitting: Family Medicine

## 2024-04-04 DIAGNOSIS — Z1231 Encounter for screening mammogram for malignant neoplasm of breast: Secondary | ICD-10-CM

## 2024-04-08 ENCOUNTER — Encounter (HOSPITAL_COMMUNITY): Payer: Self-pay

## 2024-04-15 ENCOUNTER — Ambulatory Visit
Admission: RE | Admit: 2024-04-15 | Discharge: 2024-04-15 | Disposition: A | Discharge status: Home / Self Care | Source: Ambulatory Visit | Attending: Family Medicine | Admitting: Family Medicine

## 2024-04-15 DIAGNOSIS — Z1231 Encounter for screening mammogram for malignant neoplasm of breast: Secondary | ICD-10-CM

## 2024-04-28 ENCOUNTER — Other Ambulatory Visit: Payer: Self-pay

## 2024-04-28 ENCOUNTER — Inpatient Hospital Stay (HOSPITAL_COMMUNITY)
Admission: EM | Admit: 2024-04-28 | Discharge: 2024-05-01 | DRG: 389 | Disposition: A | Discharge status: Home / Self Care | Attending: Internal Medicine | Admitting: Internal Medicine

## 2024-04-28 ENCOUNTER — Encounter (HOSPITAL_COMMUNITY): Payer: Self-pay

## 2024-04-28 DIAGNOSIS — E66813 Obesity, class 3: Secondary | ICD-10-CM | POA: Diagnosis present

## 2024-04-28 DIAGNOSIS — G8929 Other chronic pain: Secondary | ICD-10-CM | POA: Diagnosis present

## 2024-04-28 DIAGNOSIS — E1165 Type 2 diabetes mellitus with hyperglycemia: Secondary | ICD-10-CM | POA: Diagnosis present

## 2024-04-28 DIAGNOSIS — I10 Essential (primary) hypertension: Secondary | ICD-10-CM | POA: Diagnosis present

## 2024-04-28 DIAGNOSIS — F410 Panic disorder [episodic paroxysmal anxiety] without agoraphobia: Secondary | ICD-10-CM | POA: Diagnosis present

## 2024-04-28 DIAGNOSIS — J4489 Other specified chronic obstructive pulmonary disease: Secondary | ICD-10-CM | POA: Diagnosis present

## 2024-04-28 DIAGNOSIS — E8809 Other disorders of plasma-protein metabolism, not elsewhere classified: Secondary | ICD-10-CM | POA: Diagnosis present

## 2024-04-28 DIAGNOSIS — Z7984 Long term (current) use of oral hypoglycemic drugs: Secondary | ICD-10-CM

## 2024-04-28 DIAGNOSIS — Z79890 Hormone replacement therapy (postmenopausal): Secondary | ICD-10-CM

## 2024-04-28 DIAGNOSIS — K219 Gastro-esophageal reflux disease without esophagitis: Secondary | ICD-10-CM | POA: Diagnosis present

## 2024-04-28 DIAGNOSIS — I959 Hypotension, unspecified: Secondary | ICD-10-CM | POA: Diagnosis present

## 2024-04-28 DIAGNOSIS — Z79899 Other long term (current) drug therapy: Secondary | ICD-10-CM

## 2024-04-28 DIAGNOSIS — E860 Dehydration: Secondary | ICD-10-CM | POA: Diagnosis present

## 2024-04-28 DIAGNOSIS — G4733 Obstructive sleep apnea (adult) (pediatric): Secondary | ICD-10-CM | POA: Diagnosis present

## 2024-04-28 DIAGNOSIS — E785 Hyperlipidemia, unspecified: Secondary | ICD-10-CM | POA: Diagnosis present

## 2024-04-28 DIAGNOSIS — K56600 Partial intestinal obstruction, unspecified as to cause: Principal | ICD-10-CM | POA: Diagnosis present

## 2024-04-28 DIAGNOSIS — F32A Depression, unspecified: Secondary | ICD-10-CM | POA: Diagnosis present

## 2024-04-28 DIAGNOSIS — M797 Fibromyalgia: Secondary | ICD-10-CM | POA: Diagnosis present

## 2024-04-28 DIAGNOSIS — D72829 Elevated white blood cell count, unspecified: Secondary | ICD-10-CM | POA: Diagnosis present

## 2024-04-28 DIAGNOSIS — E039 Hypothyroidism, unspecified: Secondary | ICD-10-CM | POA: Diagnosis present

## 2024-04-28 DIAGNOSIS — Z90710 Acquired absence of both cervix and uterus: Secondary | ICD-10-CM

## 2024-04-28 DIAGNOSIS — Z6841 Body Mass Index (BMI) 40.0 and over, adult: Secondary | ICD-10-CM

## 2024-04-28 DIAGNOSIS — E1142 Type 2 diabetes mellitus with diabetic polyneuropathy: Secondary | ICD-10-CM | POA: Diagnosis present

## 2024-04-28 DIAGNOSIS — Z7985 Long-term (current) use of injectable non-insulin antidiabetic drugs: Secondary | ICD-10-CM

## 2024-04-28 DIAGNOSIS — K5909 Other constipation: Secondary | ICD-10-CM | POA: Diagnosis present

## 2024-04-28 DIAGNOSIS — K56609 Unspecified intestinal obstruction, unspecified as to partial versus complete obstruction: Secondary | ICD-10-CM | POA: Diagnosis not present

## 2024-04-28 DIAGNOSIS — E876 Hypokalemia: Secondary | ICD-10-CM | POA: Diagnosis present

## 2024-04-28 DIAGNOSIS — M170 Bilateral primary osteoarthritis of knee: Secondary | ICD-10-CM | POA: Diagnosis present

## 2024-04-28 DIAGNOSIS — F17210 Nicotine dependence, cigarettes, uncomplicated: Secondary | ICD-10-CM | POA: Diagnosis present

## 2024-04-28 NOTE — ED Triage Notes (Signed)
 Pt bibgcems from home, pt c/o abd pain radiating up. Pain 10/10 currently. Pt has hx of Small bowel obstruction. Pt hypotensive with ems palpated bp 86. Pt had 1 liter of fluid with ems.  Hr 106 Cbg 220 99% 3L

## 2024-04-29 ENCOUNTER — Emergency Department (HOSPITAL_COMMUNITY)

## 2024-04-29 ENCOUNTER — Observation Stay (HOSPITAL_COMMUNITY)

## 2024-04-29 ENCOUNTER — Telehealth: Payer: Self-pay

## 2024-04-29 DIAGNOSIS — R110 Nausea: Secondary | ICD-10-CM

## 2024-04-29 DIAGNOSIS — K56609 Unspecified intestinal obstruction, unspecified as to partial versus complete obstruction: Secondary | ICD-10-CM | POA: Diagnosis not present

## 2024-04-29 DIAGNOSIS — E66813 Obesity, class 3: Secondary | ICD-10-CM | POA: Diagnosis not present

## 2024-04-29 LAB — COMPREHENSIVE METABOLIC PANEL WITH GFR
ALT: 22 U/L (ref 0–44)
ALT: 25 U/L (ref 0–44)
AST: 25 U/L (ref 15–41)
AST: 35 U/L (ref 15–41)
Albumin: 3 g/dL — ABNORMAL LOW (ref 3.5–5.0)
Albumin: 3.5 g/dL (ref 3.5–5.0)
Alkaline Phosphatase: 45 U/L (ref 38–126)
Alkaline Phosphatase: 49 U/L (ref 38–126)
Anion gap: 18 — ABNORMAL HIGH (ref 5–15)
Anion gap: 8 (ref 5–15)
BUN: 10 mg/dL (ref 8–23)
BUN: 11 mg/dL (ref 8–23)
CO2: 22 mmol/L (ref 22–32)
CO2: 28 mmol/L (ref 22–32)
Calcium: 8.9 mg/dL (ref 8.9–10.3)
Calcium: 9 mg/dL (ref 8.9–10.3)
Chloride: 102 mmol/L (ref 98–111)
Chloride: 99 mmol/L (ref 98–111)
Creatinine, Ser: 0.73 mg/dL (ref 0.44–1.00)
Creatinine, Ser: 0.9 mg/dL (ref 0.44–1.00)
GFR, Estimated: >60 mL/min (ref >60)
GFR, Estimated: >60 mL/min (ref >60)
Glucose, Bld: 155 mg/dL — ABNORMAL HIGH (ref 70–99)
Glucose, Bld: 199 mg/dL — ABNORMAL HIGH (ref 70–99)
Potassium: 3.7 mmol/L (ref 3.5–5.1)
Potassium: 3.9 mmol/L (ref 3.5–5.1)
Sodium: 138 mmol/L (ref 135–145)
Sodium: 139 mmol/L (ref 135–145)
Total Bilirubin: 0.6 mg/dL (ref 0.0–1.2)
Total Bilirubin: 1 mg/dL (ref 0.0–1.2)
Total Protein: 6.6 g/dL (ref 6.5–8.1)
Total Protein: 7.2 g/dL (ref 6.5–8.1)

## 2024-04-29 LAB — CBC WITH DIFFERENTIAL/PLATELET
Abs Immature Granulocytes: 0.05 10*3/uL (ref 0.00–0.07)
Abs Immature Granulocytes: 0.09 10*3/uL — ABNORMAL HIGH (ref 0.00–0.07)
Basophils Absolute: 0 10*3/uL (ref 0.0–0.1)
Basophils Absolute: 0.1 10*3/uL (ref 0.0–0.1)
Basophils Relative: 0 %
Basophils Relative: 0 %
Eosinophils Absolute: 0.1 10*3/uL (ref 0.0–0.5)
Eosinophils Absolute: 0.1 10*3/uL (ref 0.0–0.5)
Eosinophils Relative: 1 %
Eosinophils Relative: 1 %
HCT: 40 % (ref 36.0–46.0)
HCT: 45.1 % (ref 36.0–46.0)
Hemoglobin: 13.2 g/dL (ref 12.0–15.0)
Hemoglobin: 14.7 g/dL (ref 12.0–15.0)
Immature Granulocytes: 0 %
Immature Granulocytes: 1 %
Lymphocytes Relative: 15 %
Lymphocytes Relative: 31 %
Lymphs Abs: 2.4 10*3/uL (ref 0.7–4.0)
Lymphs Abs: 4.1 10*3/uL — ABNORMAL HIGH (ref 0.7–4.0)
MCH: 30.7 pg (ref 26.0–34.0)
MCH: 30.8 pg (ref 26.0–34.0)
MCHC: 32.6 g/dL (ref 30.0–36.0)
MCHC: 33 g/dL (ref 30.0–36.0)
MCV: 93.2 fL (ref 80.0–100.0)
MCV: 94.2 fL (ref 80.0–100.0)
Monocytes Absolute: 1 10*3/uL (ref 0.1–1.0)
Monocytes Absolute: 1.1 10*3/uL — ABNORMAL HIGH (ref 0.1–1.0)
Monocytes Relative: 7 %
Monocytes Relative: 8 %
Neutro Abs: 12.7 10*3/uL — ABNORMAL HIGH (ref 1.7–7.7)
Neutro Abs: 8 10*3/uL — ABNORMAL HIGH (ref 1.7–7.7)
Neutrophils Relative %: 60 %
Neutrophils Relative %: 76 %
Platelets: 275 10*3/uL (ref 150–400)
Platelets: 303 10*3/uL (ref 150–400)
RBC: 4.29 MIL/uL (ref 3.87–5.11)
RBC: 4.79 MIL/uL (ref 3.87–5.11)
RDW: 13.8 % (ref 11.5–15.5)
RDW: 14.2 % (ref 11.5–15.5)
WBC: 13.4 10*3/uL — ABNORMAL HIGH (ref 4.0–10.5)
WBC: 16.4 10*3/uL — ABNORMAL HIGH (ref 4.0–10.5)
nRBC: 0 % (ref 0.0–0.2)
nRBC: 0 % (ref 0.0–0.2)

## 2024-04-29 LAB — LIPASE, BLOOD: Lipase: 26 U/L (ref 11–51)

## 2024-04-29 LAB — MAGNESIUM: Magnesium: 1.8 mg/dL (ref 1.7–2.4)

## 2024-04-29 MED ORDER — PANTOPRAZOLE SODIUM 40 MG IV SOLR
40.0000 mg | INTRAVENOUS | Status: DC
  Administered 2024-04-29: 40 mg via INTRAVENOUS
  Filled 2024-04-29: qty 10

## 2024-04-29 MED ORDER — ACETAMINOPHEN 650 MG RE SUPP
650.0000 mg | Freq: Four times a day (QID) | RECTAL | Status: DC | PRN
Start: 2024-04-29 — End: 2024-05-01

## 2024-04-29 MED ORDER — LACTATED RINGERS IV BOLUS
1000.0000 mL | Freq: Once | INTRAVENOUS | Status: AC
  Administered 2024-04-29: 1000 mL via INTRAVENOUS

## 2024-04-29 MED ORDER — LORAZEPAM 2 MG/ML IJ SOLN
1.0000 mg | Freq: Three times a day (TID) | INTRAMUSCULAR | Status: DC | PRN
  Administered 2024-04-29 – 2024-04-30 (×3): 1 mg via INTRAVENOUS
  Filled 2024-04-29 (×3): qty 1

## 2024-04-29 MED ORDER — HYDROMORPHONE HCL 1 MG/ML IJ SOLN
1.0000 mg | Freq: Once | INTRAMUSCULAR | Status: DC
  Filled 2024-04-29: qty 1

## 2024-04-29 MED ORDER — ACETAMINOPHEN 325 MG PO TABS
650.0000 mg | ORAL_TABLET | Freq: Four times a day (QID) | ORAL | Status: DC | PRN
  Administered 2024-04-30: 650 mg via ORAL
  Filled 2024-04-29 (×2): qty 2

## 2024-04-29 MED ORDER — ALBUTEROL SULFATE (2.5 MG/3ML) 0.083% IN NEBU: 2.5000 mg | INHALATION_SOLUTION | Freq: Four times a day (QID) | RESPIRATORY_TRACT | Status: DC | PRN

## 2024-04-29 MED ORDER — LACTATED RINGERS IV SOLN: INTRAVENOUS | Status: DC

## 2024-04-29 MED ORDER — FENTANYL CITRATE PF 50 MCG/ML IJ SOSY: 25.0000 ug | PREFILLED_SYRINGE | INTRAMUSCULAR | Status: DC | PRN

## 2024-04-29 MED ORDER — DIATRIZOATE MEGLUMINE & SODIUM 66-10 % PO SOLN
90.0000 mL | Freq: Once | ORAL | Status: DC
  Filled 2024-04-29: qty 90

## 2024-04-29 MED ORDER — FENTANYL CITRATE PF 50 MCG/ML IJ SOSY
100.0000 ug | PREFILLED_SYRINGE | Freq: Once | INTRAMUSCULAR | Status: AC
  Administered 2024-04-29: 100 ug via INTRAVENOUS
  Filled 2024-04-29: qty 2

## 2024-04-29 MED ORDER — FENTANYL CITRATE PF 50 MCG/ML IJ SOSY
50.0000 ug | PREFILLED_SYRINGE | Freq: Once | INTRAMUSCULAR | Status: AC
  Administered 2024-04-29: 50 ug via INTRAVENOUS
  Filled 2024-04-29: qty 1

## 2024-04-29 MED ORDER — IOHEXOL 350 MG/ML SOLN
75.0000 mL | Freq: Once | INTRAVENOUS | Status: AC | PRN
  Administered 2024-04-29: 75 mL via INTRAVENOUS

## 2024-04-29 MED ORDER — METHOCARBAMOL 1000 MG/10ML IJ SOLN
500.0000 mg | Freq: Once | INTRAMUSCULAR | Status: AC
  Administered 2024-04-29: 500 mg via INTRAVENOUS
  Filled 2024-04-29: qty 10

## 2024-04-29 MED ORDER — ONDANSETRON HCL 4 MG/2ML IJ SOLN: 4.0000 mg | Freq: Four times a day (QID) | INTRAMUSCULAR | Status: DC | PRN

## 2024-04-29 MED ORDER — DIATRIZOATE MEGLUMINE & SODIUM 66-10 % PO SOLN
90.0000 mL | Freq: Once | ORAL | Status: AC
  Administered 2024-04-29: 90 mL via ORAL
  Filled 2024-04-29: qty 90

## 2024-04-29 MED ORDER — NALOXONE HCL 0.4 MG/ML IJ SOLN: 0.4000 mg | INTRAMUSCULAR | Status: DC | PRN

## 2024-04-29 MED ORDER — ONDANSETRON HCL 4 MG/2ML IJ SOLN
4.0000 mg | Freq: Once | INTRAMUSCULAR | Status: AC
  Administered 2024-04-29: 4 mg via INTRAVENOUS
  Filled 2024-04-29: qty 2

## 2024-04-29 MED ORDER — FENTANYL CITRATE PF 50 MCG/ML IJ SOSY
50.0000 ug | PREFILLED_SYRINGE | INTRAMUSCULAR | Status: DC | PRN
  Administered 2024-04-29: 50 ug via INTRAVENOUS
  Filled 2024-04-29: qty 1

## 2024-04-29 MED ORDER — LACTATED RINGERS IV SOLN: INTRAVENOUS | Status: AC

## 2024-04-29 MED ORDER — FENTANYL CITRATE PF 50 MCG/ML IJ SOSY: 100.0000 ug | PREFILLED_SYRINGE | INTRAMUSCULAR | Status: DC | PRN

## 2024-04-29 MED ORDER — FENTANYL CITRATE PF 50 MCG/ML IJ SOSY
25.0000 ug | PREFILLED_SYRINGE | INTRAMUSCULAR | Status: DC | PRN
  Administered 2024-04-29 – 2024-04-30 (×5): 50 ug via INTRAVENOUS
  Filled 2024-04-29 (×6): qty 1

## 2024-04-29 MED ORDER — SODIUM CHLORIDE 0.9 % IV BOLUS
1000.0000 mL | Freq: Once | INTRAVENOUS | Status: AC
  Administered 2024-04-29: 1000 mL via INTRAVENOUS

## 2024-04-29 NOTE — H&P (Signed)
 History and Physical    Patient: Denise Case:[id number] DOB: [birth year] DOA: 04/28/2024 DOS: the patient was seen and examined on 04/29/2024 PCP: Benedetto Brady, MD  Patient coming from: Home  Chief Complaint:  Chief Complaint  Patient presents with   Abdominal Pain   HPI: Denise Case is a 68 y.o. female with medical history significant for but not limited to asthma and COPD, depression anxiety, GERD, fibromyalgia, fatty liver disease, history of panic attacks, hypertension, hyperlipidemia, hypothyroidism, peripheral neuropathy and diabetes mellitus type 2, and a history of recurrent small bowel obstructions who presents with abdominal discomfort and pain.  Patient states that the abdominal pain started yesterday morning progressively started getting worse.  She describes it as a 10 out of 10 in severity that radiated up the mid middle of her abdomen.  She reported nausea but no vomiting and yesterday she had episode of watery nonbloody diarrhea but has not had any further bowel movement since then.  Of note she states that she is not having any flatus now.  90 chest pain or shortness of breath.  He was hypertensive on arrival to the ED.  She denies any other symptoms and states that she has had multiple episodes of small bowel obstructions.  Patient was admitted for her nausea and abdominal pain in the setting of her partial small bowel obstruction and general surgery's been consulted for further evaluation.  Review of Systems: As mentioned in the history of present illness. All other systems reviewed and are negative. Past Medical History:  Diagnosis Date   Anxiety    Asthma    Constipation due to pain medication    COPD (chronic obstructive pulmonary disease) (HCC)    Depression    Dermatitis    Diabetes mellitus without complication (HCC)    Diverticulitis    Fatty liver 12/02/2014   Fibromyalgia    GERD (gastroesophageal reflux disease)    History of kidney stones    History  of panic attacks    HTN (hypertension) 06/14/2019   Hyperlipidemia    Hypothyroidism    Lumbar sprain    Migraine    Morbid obesity (HCC)    Neuropathy    feet and legs   Opiate dependence (HCC) 12/03/2014   Pneumonia 2015   Requires supplemental oxygen     PRN uses 1.5 Liters   SBO (small bowel obstruction) (HCC) 12/01/2014   Thyroid  disease    Tingling sensation    hands   Past Surgical History:  Procedure Laterality Date   ABDOMINAL HYSTERECTOMY     bladder tack     X 2   CHOLECYSTECTOMY N/A 05/25/2014   Procedure: LAPAROSCOPIC CHOLECYSTECTOMY WITH INTRAOPERATIVE CHOLANGIOGRAM;  Surgeon: Harlee Lichtenstein, MD;  Location: [facility];  Service: General;  Laterality: N/A;   COLONOSCOPY WITH PROPOFOL  N/A 04/05/2015   Procedure: COLONOSCOPY WITH PROPOFOL ;  Surgeon: Tami Falcon, MD;  Location: [facility];  Service: Endoscopy;  Laterality: N/A;   COLONOSCOPY WITH PROPOFOL  N/A 12/05/2022   Procedure: COLONOSCOPY WITH PROPOFOL ;  Surgeon: Alvis Jourdain, MD;  Location: [facility];  Service: Gastroenterology;  Laterality: N/A;   POLYPECTOMY  12/05/2022   Procedure: POLYPECTOMY;  Surgeon: Alvis Jourdain, MD;  Location: [facility];  Service: Gastroenterology;;   Social History:  reports that she has been smoking cigarettes. She has a 37 pack-year smoking history. She has never used smokeless tobacco. She reports that she does not drink alcohol and does not use drugs.  Allergies  Allergen Reactions   Montelukast  Sodium Anaphylaxis and Swelling   Doxycycline Other (See Comments)    Strips lining off of top of tongue, like thrush   Hydrocodone-Acetaminophen  Other (See Comments)    Migraines (high doses)   Morphine  Other (See Comments)    migraine   Family History  Problem Relation Age of Onset   Diabetes Mother    Prior to Admission medications   Medication Sig Start Date End Date Taking? Authorizing Provider  acetaminophen  (TYLENOL ) 500 MG tablet Take 1,000 mg by mouth every 6 (six) hours as  needed for moderate pain.    [provider]  albuterol  (VENTOLIN  HFA) 108 (90 Base) MCG/ACT inhaler Inhale 2 puffs into the lungs every 6 (six) hours as needed for wheezing or shortness of breath. 07/24/22   Sofia, Leslie K, PA-C  allopurinol  (ZYLOPRIM ) 300 MG tablet Take 300 mg by mouth daily. 01/28/19   [provider]  ALPRAZolam  (XANAX ) 1 MG tablet Take 1 mg by mouth 3 (three) times daily.    [provider]  Ascorbic Acid (VITAMIN C) 1000 MG tablet Take 1,000 mg by mouth 3 (three) times daily.    [provider]  atorvastatin  (LIPITOR) 10 MG tablet Take 10 mg by mouth daily.    [provider]  celecoxib (CELEBREX) 200 MG capsule Take 200 mg by mouth daily as needed for mild pain. 04/08/23   [provider]  Cholecalciferol (VITAMIN D3) 50 MCG (2000 UT) capsule Take 2,000 Units by mouth daily. 08/29/19   [provider]  Cinnamon 500 MG TABS Take 2,000 mg by mouth daily.    [provider]  cyanocobalamin  (VITAMIN B12) 1000 MCG tablet Take 1,000 mcg by mouth daily. 08/29/19   [provider]  cyclobenzaprine  (FLEXERIL ) 10 MG tablet Take 10 mg by mouth 3 (three) times daily.    [provider]  dicyclomine  (BENTYL ) 20 MG tablet Take 1 tablet (20 mg total) by mouth 4 (four) times daily -  before meals and at bedtime. 05/19/23   Deatra Face, MD  ergocalciferol (VITAMIN D2) 50000 UNITS capsule Take 50,000 Units by mouth every Friday.    [provider]  fish oil-omega-3 fatty acids 1000 MG capsule Take 1 Case by mouth 2 (two) times daily.    [provider]  FLUoxetine  (PROZAC ) 20 MG capsule Take 20 mg by mouth daily.    [provider]  furosemide (LASIX) 20 MG tablet Take 50 mg by mouth daily.    [provider]  gabapentin  (NEURONTIN ) 800 MG tablet Take 800 mg by mouth 3 (three) times daily.     [provider]  glipiZIDE (GLUCOTROL) 5 MG tablet Take 5 mg by mouth  daily before breakfast.    [provider]  levothyroxine  (SYNTHROID , LEVOTHROID) 75 MCG tablet Take 3 tablets (225 mcg total) by mouth daily before breakfast. 12/06/14   Denise Galley, MD  Melatonin 10 MG CAPS Take 20 mg by mouth at bedtime.    [provider]  Menthol, Topical Analgesic, (MINERAL ICE EX) Apply 1 Application topically daily as needed (pain).    [provider]  Multiple Vitamins-Minerals (MULTIVITAMIN GUMMIES ADULT PO) Take 1 each by mouth daily.    [provider]  ondansetron  (ZOFRAN -ODT) 8 MG disintegrating tablet Take 8 mg by mouth every 8 (eight) hours as needed for vomiting or nausea. 07/16/21   [provider]  OVER THE COUNTER MEDICATION Take 1 tablet by mouth 2 (two) times daily. Striction D supplement  [provider]  OVER THE COUNTER MEDICATION Take 2 capsules by mouth daily. Natural Cleanser otc supplement    [provider]  pantoprazole  (PROTONIX ) 40 MG tablet Take 40 mg by mouth daily. 01/25/19   [provider]  pseudoephedrine-acetaminophen  (TYLENOL  SINUS) 30-500 MG TABS Take 2 tablets by mouth as needed (congestion).    [provider]  Semaglutide, 1 MG/DOSE, (OZEMPIC, 1 MG/DOSE,) 2 MG/1.5ML SOPN Inject 1 mg into the skin every Sunday.    [provider]  theophylline  (THEO-24 ) 300 MG 24 hr capsule Take 300 mg by mouth daily.    [provider]  traMADol  (ULTRAM ) 50 MG tablet Take 50 mg by mouth in the morning, at noon, and at bedtime.    [provider]  TURMERIC PO Take 1 tablet by mouth daily.    [provider]   Physical Exam: Vitals:   04/29/24 3086 04/29/24 0506 04/29/24 0635 04/29/24 0736  BP:  (!) 88/52 (!) 101/55 106/62  Pulse:  88  91  Resp:  18  16  Temp:  98.2 F (36.8 C)  98.3 F (36.8 C)  TempSrc:  Oral  Oral  SpO2:  97%  98%  Weight: 129.6 kg     Height:       Examination: Physical Exam:  Constitutional: WN/WD chronically  ill-appearing morbidly obese Caucasian female who appears a little uncomfortable Respiratory: Diminished to auscultation bilaterally, no wheezing, rales, rhonchi or crackles. Normal respiratory effort and patient is not tachypenic. No accessory muscle use.  Unlabored breathing Cardiovascular: RRR, no murmurs / rubs / gallops. S1 and S2 auscultated. No extremity edema.  Abdomen: Soft, tender to palpate and distended secondary body habitus. Bowel sounds diminished.  GU: Deferred. Musculoskeletal: No clubbing / cyanosis of digits/nails. No joint deformity upper and lower extremities Skin: No rashes, lesions, ulcers on limited skin evaluation. No induration; Warm and dry.  Neurologic: CN 2-12 grossly intact with no focal deficits. Romberg sign and cerebellar reflexes not assessed.  Psychiatric: Normal judgment and insight. Alert and oriented x 3. Normal mood and appropriate affect.   Data Reviewed: Recent Results (from the past 2160 hours)  Comprehensive metabolic panel     Status: Abnormal   Collection Time: 04/28/24 11:59 PM  Result Value Ref Range   Sodium 139 135 - 145 mmol/L   Potassium 3.9 3.5 - 5.1 mmol/L    Comment: HEMOLYSIS AT THIS LEVEL MAY AFFECT RESULT   Chloride 99 98 - 111 mmol/L   CO2 22 22 - 32 mmol/L   Glucose, Bld 199 (H) 70 - 99 mg/dL    Comment: Glucose reference range applies only to samples taken after fasting for at least 8 hours.   BUN 11 8 - 23 mg/dL   Creatinine, Ser 5.78 0.44 - 1.00 mg/dL   Calcium  9.0 8.9 - 10.3 mg/dL   Total Protein 7.2 6.5 - 8.1 Case/dL   Albumin 3.5 3.5 - 5.0 Case/dL   AST 35 15 - 41 U/L    Comment: HEMOLYSIS AT THIS LEVEL MAY AFFECT RESULT   ALT 25 0 - 44 U/L    Comment: HEMOLYSIS AT THIS LEVEL MAY AFFECT RESULT   Alkaline Phosphatase 49 38 - 126 U/L   Total Bilirubin 1.0 0.0 - 1.2 mg/dL    Comment: HEMOLYSIS AT THIS LEVEL MAY AFFECT RESULT   GFR, Estimated >60 >60 mL/min    Comment: (NOTE) Calculated using the CKD-EPI Creatinine Equation  (2021)    Anion gap 18 (H) 5 -  15    Comment: Performed at [facility], [address] [place], [place] [address]  Lipase, blood     Status: None   Collection Time: 04/28/24 11:59 PM  Result Value Ref Range   Lipase 26 11 - 51 U/L    Comment: Performed at [facility], [address], [place], [place] [address]  CBC with Diff     Status: Abnormal   Collection Time: 04/28/24 11:59 PM  Result Value Ref Range   WBC 16.4 (H) 4.0 - 10.5 K/uL   RBC 4.79 3.87 - 5.11 MIL/uL   Hemoglobin 14.7 12.0 - 15.0 Case/dL   HCT 62.1 30.8 - 65.7 %   MCV 94.2 80.0 - 100.0 fL   MCH 30.7 26.0 - 34.0 pg   MCHC 32.6 30.0 - 36.0 Case/dL   RDW 84.6 96.2 - 95.2 %   Platelets 303 150 - 400 K/uL   nRBC 0.0 0.0 - 0.2 %   Neutrophils Relative % 76 %   Neutro Abs 12.7 (H) 1.7 - 7.7 K/uL   Lymphocytes Relative 15 %   Lymphs Abs 2.4 0.7 - 4.0 K/uL   Monocytes Relative 7 %   Monocytes Absolute 1.1 (H) 0.1 - 1.0 K/uL   Eosinophils Relative 1 %   Eosinophils Absolute 0.1 0.0 - 0.5 K/uL   Basophils Relative 0 %   Basophils Absolute 0.1 0.0 - 0.1 K/uL   Immature Granulocytes 1 %   Abs Immature Granulocytes 0.09 (H) 0.00 - 0.07 K/uL    Comment: Performed at [facility], [address], [place], [place] [address]  CBC with Differential/Platelet     Status: Abnormal   Collection Time: 04/29/24  6:26 AM  Result Value Ref Range   WBC 13.4 (H) 4.0 - 10.5 K/uL   RBC 4.29 3.87 - 5.11 MIL/uL   Hemoglobin 13.2 12.0 - 15.0 Case/dL   HCT 44.0 10.2 - 72.5 %   MCV 93.2 80.0 - 100.0 fL   MCH 30.8 26.0 - 34.0 pg   MCHC 33.0 30.0 - 36.0 Case/dL   RDW 36.6 44.0 - 34.7 %   Platelets 275 150 - 400 K/uL   nRBC 0.0 0.0 - 0.2 %   Neutrophils Relative % 60 %   Neutro Abs 8.0 (H) 1.7 - 7.7 K/uL   Lymphocytes Relative 31 %   Lymphs Abs 4.1 (H) 0.7 - 4.0 K/uL   Monocytes Relative 8 %   Monocytes Absolute 1.0 0.1 - 1.0 K/uL   Eosinophils Relative 1 %   Eosinophils Absolute 0.1 0.0 - 0.5 K/uL   Basophils Relative 0 %    Basophils Absolute 0.0 0.0 - 0.1 K/uL   Immature Granulocytes 0 %   Abs Immature Granulocytes 0.05 0.00 - 0.07 K/uL    Comment: Performed at [facility], [address] [place], [place] [address]  Comprehensive metabolic panel with GFR     Status: Abnormal   Collection Time: 04/29/24  6:26 AM  Result Value Ref Range   Sodium 138 135 - 145 mmol/L   Potassium 3.7 3.5 - 5.1 mmol/L   Chloride 102 98 - 111 mmol/L   CO2 28 22 - 32 mmol/L   Glucose, Bld 155 (H) 70 - 99 mg/dL    Comment: Glucose reference range applies only to samples taken after fasting for at least 8 hours.   BUN 10 8 - 23 mg/dL   Creatinine, Ser 6.38 0.44 - 1.00 mg/dL  Calcium  8.9 8.9 - 10.3 mg/dL   Total Protein 6.6 6.5 - 8.1 Case/dL   Albumin 3.0 (L) 3.5 - 5.0 Case/dL   AST 25 15 - 41 U/L   ALT 22 0 - 44 U/L   Alkaline Phosphatase 45 38 - 126 U/L   Total Bilirubin 0.6 0.0 - 1.2 mg/dL   GFR, Estimated >78 >29 mL/min    Comment: (NOTE) Calculated using the CKD-EPI Creatinine Equation (2021)    Anion gap 8 5 - 15    Comment: Performed at [facility], [address] [place], [place] [address]  Magnesium     Status: None   Collection Time: 04/29/24  6:26 AM  Result Value Ref Range   Magnesium 1.8 1.7 - 2.4 mg/dL    Comment: Performed at [facility], [address], [place], [place] [address]   EKG: Independently reviewed and showed sinus tachycardia rate 100 and a QTc of 474  Assessment and Plan: No notes have been filed under this hospital service. Service: Hospitalist  Nausea and Abdominal Pain in the setting of partial Small Bowel Obstruction: Has had multiple SBO's in the past and has had multiple abdominal surgeries.  Had a pain 10 out of 10 and had nausea but no actual vomiting and had abdominal pain.  States her last bowel movement was yesterday.  No longer passing flatus.  CT scan of the abdomen pelvis done and showed Dilated mid small bowel loops with scattered air-fluid levels where  Proximal and distal small bowel decompressed.  Concerning for partial small bowel obstruction with transition point not identified.  General surgery consulted and recommended n.p.o. and maintenance IV fluids as well as placement of NG tube however she had a bowel movement prior to this.  Other doing the small bowel obstruction protocol with Gastrografin  orally.  Continue holding oral medications and follow small bowel protocol.  Continue with antiemetics with ondansetron  4 mg IV every 6 as needed for nausea and continue with IV fluids LR 75 mL/h for 1 day, pain control with fentanyl  25 to 50 mcg every 2 as needed for severe pain given that her blood pressure is on the lower side.  Leukocytosis: Likely in the Setting of Above. WBC went from 16.4 -> 13.4. CTM for S/Sx of infection and repeat CBC in the AM   Hypotension: In the setting of Above. Hold Antihypertensives. Given 1 L LR Bolus. C/w mIVF w/ LR @ 75 mL/hr. CTM BP per Protcol. Last BP reading was on the softer side @ 97/63  Anxiety and Depression: Has a history of panic attacks.  Will hold her home alprazolam  but changed to IV lorazepam  1 mg Q8 as needed.  Hold her antidepressant with fluoxetine  10 mg p.o. daily for now until able to safely swallow  Hypothyroidism: Check TSH in the morning.  Continue to hold her home medications until tolerating diet  Diabetes mellitus type 2 complicated by neuropathy: Home her home glipizide 5 mg p.o. daily and tirzepatide.  Placed on sensitive NovoLog /scale insulin  every 4 given that she is n.p.o. continue to hold her home gabapentin  800 Case p.o. 3 times daily until able to safely swallow  Hyperlipidemia: Holding her home atorvastatin  given that she is n.p.o.  Asthma and COPD: Holding her home theophylline  for now until able to tolerate oral medications.  Continue with as needed nebs  GERD/GI Prophylaxis: Will place on IV pantoprazole  40 mg daily  Hypoalbuminemia: Patient's Albumin Level went from 3.5 -> 3.0.  CTM and Trend and Repeat CMP in the AM   Class III (Morbid) Obesity: Complicates overall prognosis and care -Estimated body mass index is 49.04 kg/m as calculated from the following:   Height as of this encounter: 5\' 4"  (1.626 m).   Weight as of this encounter: 129.6 kg. Weight Loss and Dietary Counseling given  Advance Care Planning:   Code Status: Full Code   Consults: General Surgery  Family Communication: D/w Husband @ Bedside  Severity of Illness: The appropriate patient status for this patient is OBSERVATION. Observation status is judged to be reasonable and necessary in order to provide the required intensity of service to ensure the patient's safety. The patient's presenting symptoms, physical exam findings, and initial radiographic and laboratory data in the context of their medical condition is felt to place them at decreased risk for further clinical deterioration. Furthermore, it is anticipated that the patient will be medically stable for discharge from the hospital within 2 midnights of admission.   Author: Aura Leeds, DO [facility] 04/29/2024 7:48 AM  For on call review [email or web address].

## 2024-04-29 NOTE — Plan of Care (Signed)

## 2024-04-29 NOTE — Care Management (Signed)
  Transition of Care [facility]) Screening Note   Patient Details  Name: Denise Case Date of Birth: [birth year]   Transition of Care [facility]) CM/SW Contact:    Ronni Colace, RN Phone Number: 04/29/2024, 2:44 PM    Transition of Care Department [facility]) has reviewed patient and no TOC needs have been identified at this time. We will continue to monitor patient advancement through interdisciplinary progression rounds. If new patient transition needs arise, please place a TOC consult.

## 2024-04-29 NOTE — Consult Note (Signed)
 Consult Note  Denise Case 1956-02-01  [id number].    Requesting MD: Eveline Hipps, DO Chief Complaint/Reason for Consult: SBO HPI:  Patient is a 68 year old female who presented to the ED with abdominal pain that started yesterday AM and progressively worsened. Pain started just in upper abdomen when she woke but was not severe. She went back to bed but when she got up later in the day noted pain was more intense and from umbilicus to epigastric abdomen. Feels similar to when she had a prior SBO last year which resolved non-operatively. Last BM was yesterday and consistent with her norm, no blood. Still having abdominal pain. Not passing flatus, but denies nausea currently.  Prior abdominal surgery includes hysterectomy, laparoscopic cholecystectomy, and bladder tacking x3. She does have chronic constipation but takes multiple medications for this and followed with Dr. Tova Fresh for GI. Takes tramadol  for chronic pain. 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 No blood thinners.   ROS: Negative other than HPI  Family History  Problem Relation Age of Onset   Diabetes Mother     Past Medical History:  Diagnosis Date   Anxiety    Asthma    Constipation due to pain medication    COPD (chronic obstructive pulmonary disease) (HCC)    Depression    Dermatitis    Diabetes mellitus without complication (HCC)    Diverticulitis    Fatty liver 12/02/2014   Fibromyalgia    GERD (gastroesophageal reflux disease)    History of kidney stones    History of panic attacks    HTN (hypertension) 06/14/2019   Hyperlipidemia    Hypothyroidism    Lumbar sprain    Migraine    Morbid obesity (HCC)    Neuropathy    feet and legs   Opiate dependence (HCC) 12/03/2014   Pneumonia 2015   Requires supplemental oxygen     PRN uses 1.5 Liters   SBO (small bowel obstruction) (HCC) 12/01/2014   Thyroid  disease    Tingling sensation    hands    Past Surgical  History:  Procedure Laterality Date   ABDOMINAL HYSTERECTOMY     bladder tack     X 2   CHOLECYSTECTOMY N/A 05/25/2014   Procedure: LAPAROSCOPIC CHOLECYSTECTOMY WITH INTRAOPERATIVE CHOLANGIOGRAM;  Surgeon: Harlee Lichtenstein, MD;  Location: [facility];  Service: General;  Laterality: N/A;   COLONOSCOPY WITH PROPOFOL  N/A 04/05/2015   Procedure: COLONOSCOPY WITH PROPOFOL ;  Surgeon: Tami Falcon, MD;  Location: [facility];  Service: Endoscopy;  Laterality: N/A;   COLONOSCOPY WITH PROPOFOL  N/A 12/05/2022   Procedure: COLONOSCOPY WITH PROPOFOL ;  Surgeon: Alvis Jourdain, MD;  Location: [facility];  Service: Gastroenterology;  Laterality: N/A;   POLYPECTOMY  12/05/2022   Procedure: POLYPECTOMY;  Surgeon: Alvis Jourdain, MD;  Location: [facility];  Service: Gastroenterology;;    Social History:  reports that she has been smoking cigarettes. She has a 37 pack-year smoking history. She has never used smokeless tobacco. She reports that she does not drink alcohol and does not use drugs.  Allergies:  Allergies  Allergen Reactions   Singulair [Montelukast Sodium] Anaphylaxis and Swelling   Adoxa [Doxycycline] Other (See Comments)    Strips lining off of top of tongue, like thrush   Hydrocodone-Acetaminophen  Other (See Comments)    Migraines (high doses)   Ms Contin  [Morphine ] Other (See Comments)    Migraines     Medications Prior to Admission  Medication Sig Dispense  Refill   acetaminophen  (TYLENOL ) 500 MG tablet Take 1,000 mg by mouth every 6 (six) hours as needed for moderate pain.     albuterol  (VENTOLIN  HFA) 108 (90 Base) MCG/ACT inhaler Inhale 2 puffs into the lungs every 6 (six) hours as needed for wheezing or shortness of breath. 8 g 1   allopurinol  (ZYLOPRIM ) 300 MG tablet Take 300 mg by mouth daily at 2 PM.     ALPRAZolam  (XANAX ) 1 MG tablet Take 1 mg by mouth 3 (three) times daily.     Ascorbic Acid (VITAMIN C PO) Take 1 tablet by mouth 3 (three) times daily.     atorvastatin  (LIPITOR) 10 MG  tablet Take 10 mg by mouth See admin instructions. Take 1 tablet (10mg ) by mouth every evening at 2000, with food.     CINNAMON PO Take 1 tablet by mouth 3 (three) times daily.     Cyanocobalamin  (VITAMIN B-12 PO) Take 1 tablet by mouth daily.     cyclobenzaprine  (FLEXERIL ) 10 MG tablet Take 10 mg by mouth 3 (three) times daily as needed for muscle spasms.     ergocalciferol (VITAMIN D2) 50000 UNITS capsule Take 50,000 Units by mouth every Friday.     FLUoxetine  (PROZAC ) 10 MG capsule Take 10 mg by mouth See admin instructions. Take 1 capsule (10mg ) by mouth once daily at 0600     furosemide (LASIX) 20 MG tablet Take 50 mg by mouth See admin instructions. Take 2.5 tablets (50mg ) by mouth once daily at 0600.     gabapentin  (NEURONTIN ) 800 MG tablet Take 800 mg by mouth 3 (three) times daily.      glipiZIDE (GLUCOTROL) 5 MG tablet Take 5 mg by mouth See admin instructions. Take 1 tablet (5mg ) by mouth once daily at 0600, with food.     levothyroxine  (SYNTHROID ) 25 MCG tablet Take 25 mcg by mouth See admin instructions. Take 1 tablet (25mcg) by mouth once daily before breakfast, at 0500     levothyroxine  (SYNTHROID , LEVOTHROID) 75 MCG tablet Take 3 tablets (225 mcg total) by mouth daily before breakfast. (Patient taking differently: Take 150 mcg by mouth See admin instructions. Take 2 tablets (150mcg) by mouth daily before breakfast, at 0500.) 90 tablet 1   Menthol, Topical Analgesic, (MINERAL ICE EX) Apply 1 Application topically daily as needed (pain).     Omega-3 Fatty Acids (FISH OIL PO) Take 1 capsule by mouth 2 (two) times daily.     ondansetron  (ZOFRAN -ODT) 8 MG disintegrating tablet Take 8 mg by mouth every 8 (eight) hours as needed for vomiting or nausea.     OVER THE COUNTER MEDICATION Take 1 tablet by mouth daily. OTC Healthy Habits StrictionD     OVER THE COUNTER MEDICATION Take 2 capsules by mouth daily. OTC "natural cleanser" supplement     OVER THE COUNTER MEDICATION Take 2 capsules by  mouth at bedtime. OTC Good Night Sleep supplement     pantoprazole  (PROTONIX ) 40 MG tablet Take 40 mg by mouth See admin instructions. Take 1 tablet (40mg ) by mouth once daily at 0600, before food.     theophylline  (THEO-24 ) 300 MG 24 hr capsule Take 300 mg by mouth See admin instructions. Take 1 tablet (300mg ) by mouth once daily at 0600.     tirzepatide (MOUNJARO) 2.5 MG/0.5ML Pen Inject 2.5 mg into the skin every Sunday.     traMADol  (ULTRAM ) 50 MG tablet Take 50 mg by mouth in the morning, at noon, and at bedtime.  Blood pressure 106/62, pulse 91, temperature 98.3 F (36.8 C), temperature source Oral, resp. rate 16, height 5\' 4"  (1.626 m), weight 129.6 kg, SpO2 98%. Physical Exam:  General: pleasant, WD, morbidly obese female who is laying in bed in NAD HEENT: head is normocephalic, atraumatic.  Sclera are noninjected.  PERRL.  Ears and nose without any masses or lesions.  Mouth is pink and moist Heart: regular, rate, and rhythm.  Normal s1,s2. No obvious murmurs, gallops, or rubs noted.  Palpable radial and pedal pulses bilaterally Lungs: CTAB, no wheezes, rhonchi, or rales noted.  Respiratory effort nonlabored Abd: soft, moderate TTP in mid abdomen without peritonitis, ND MS: all 4 extremities are symmetrical with no cyanosis, clubbing, or edema. Skin: warm and dry with no masses, lesions, or rashes Neuro: Cranial nerves 2-12 grossly intact, sensation is normal throughout Psych: A&Ox3 with an appropriate affect.   Results for orders placed or performed during the hospital encounter of 04/28/24 (from the past 48 hours)  Comprehensive metabolic panel     Status: Abnormal   Collection Time: 04/28/24 11:59 PM  Result Value Ref Range   Sodium 139 135 - 145 mmol/L   Potassium 3.9 3.5 - 5.1 mmol/L    Comment: HEMOLYSIS AT THIS LEVEL MAY AFFECT RESULT   Chloride 99 98 - 111 mmol/L   CO2 22 22 - 32 mmol/L   Glucose, Bld 199 (H) 70 - 99 mg/dL    Comment: Glucose reference range  applies only to samples taken after fasting for at least 8 hours.   BUN 11 8 - 23 mg/dL   Creatinine, Ser 8.29 0.44 - 1.00 mg/dL   Calcium  9.0 8.9 - 10.3 mg/dL   Total Protein 7.2 6.5 - 8.1 g/dL   Albumin 3.5 3.5 - 5.0 g/dL   AST 35 15 - 41 U/L    Comment: HEMOLYSIS AT THIS LEVEL MAY AFFECT RESULT   ALT 25 0 - 44 U/L    Comment: HEMOLYSIS AT THIS LEVEL MAY AFFECT RESULT   Alkaline Phosphatase 49 38 - 126 U/L   Total Bilirubin 1.0 0.0 - 1.2 mg/dL    Comment: HEMOLYSIS AT THIS LEVEL MAY AFFECT RESULT   GFR, Estimated >60 >60 mL/min    Comment: (NOTE) Calculated using the CKD-EPI Creatinine Equation (2021)    Anion gap 18 (H) 5 - 15    Comment: Performed at [facility], [address], [place], [place] [address]  Lipase, blood     Status: None   Collection Time: 04/28/24 11:59 PM  Result Value Ref Range   Lipase 26 11 - 51 U/L    Comment: Performed at [facility], [address] [place], [place] [address]  CBC with Diff     Status: Abnormal   Collection Time: 04/28/24 11:59 PM  Result Value Ref Range   WBC 16.4 (H) 4.0 - 10.5 K/uL   RBC 4.79 3.87 - 5.11 MIL/uL   Hemoglobin 14.7 12.0 - 15.0 g/dL   HCT 84.6 96.2 - 95.2 %   MCV 94.2 80.0 - 100.0 fL   MCH 30.7 26.0 - 34.0 pg   MCHC 32.6 30.0 - 36.0 g/dL   RDW 84.1 32.4 - 40.1 %   Platelets 303 150 - 400 K/uL   nRBC 0.0 0.0 - 0.2 %   Neutrophils Relative % 76 %   Neutro Abs 12.7 (H) 1.7 - 7.7 K/uL   Lymphocytes Relative 15 %   Lymphs Abs 2.4 0.7 - 4.0 K/uL  Monocytes Relative 7 %   Monocytes Absolute 1.1 (H) 0.1 - 1.0 K/uL   Eosinophils Relative 1 %   Eosinophils Absolute 0.1 0.0 - 0.5 K/uL   Basophils Relative 0 %   Basophils Absolute 0.1 0.0 - 0.1 K/uL   Immature Granulocytes 1 %   Abs Immature Granulocytes 0.09 (H) 0.00 - 0.07 K/uL    Comment: Performed at [facility], [address] [place], [place] [address]  CBC with Differential/Platelet     Status: Abnormal   Collection Time: 04/29/24  6:26  AM  Result Value Ref Range   WBC 13.4 (H) 4.0 - 10.5 K/uL   RBC 4.29 3.87 - 5.11 MIL/uL   Hemoglobin 13.2 12.0 - 15.0 g/dL   HCT 52.8 41.3 - 24.4 %   MCV 93.2 80.0 - 100.0 fL   MCH 30.8 26.0 - 34.0 pg   MCHC 33.0 30.0 - 36.0 g/dL   RDW 01.0 27.2 - 53.6 %   Platelets 275 150 - 400 K/uL   nRBC 0.0 0.0 - 0.2 %   Neutrophils Relative % 60 %   Neutro Abs 8.0 (H) 1.7 - 7.7 K/uL   Lymphocytes Relative 31 %   Lymphs Abs 4.1 (H) 0.7 - 4.0 K/uL   Monocytes Relative 8 %   Monocytes Absolute 1.0 0.1 - 1.0 K/uL   Eosinophils Relative 1 %   Eosinophils Absolute 0.1 0.0 - 0.5 K/uL   Basophils Relative 0 %   Basophils Absolute 0.0 0.0 - 0.1 K/uL   Immature Granulocytes 0 %   Abs Immature Granulocytes 0.05 0.00 - 0.07 K/uL    Comment: Performed at [facility], [address] [place], [place] [address]  Comprehensive metabolic panel with GFR     Status: Abnormal   Collection Time: 04/29/24  6:26 AM  Result Value Ref Range   Sodium 138 135 - 145 mmol/L   Potassium 3.7 3.5 - 5.1 mmol/L   Chloride 102 98 - 111 mmol/L   CO2 28 22 - 32 mmol/L   Glucose, Bld 155 (H) 70 - 99 mg/dL    Comment: Glucose reference range applies only to samples taken after fasting for at least 8 hours.   BUN 10 8 - 23 mg/dL   Creatinine, Ser 4.74 0.44 - 1.00 mg/dL   Calcium  8.9 8.9 - 10.3 mg/dL   Total Protein 6.6 6.5 - 8.1 g/dL   Albumin 3.0 (L) 3.5 - 5.0 g/dL   AST 25 15 - 41 U/L   ALT 22 0 - 44 U/L   Alkaline Phosphatase 45 38 - 126 U/L   Total Bilirubin 0.6 0.0 - 1.2 mg/dL   GFR, Estimated >25 >95 mL/min    Comment: (NOTE) Calculated using the CKD-EPI Creatinine Equation (2021)    Anion gap 8 5 - 15    Comment: Performed at [facility], [address], [place], [place] [address]  Magnesium     Status: None   Collection Time: 04/29/24  6:26 AM  Result Value Ref Range   Magnesium 1.8 1.7 - 2.4 mg/dL    Comment: Performed at [facility], [address], [place], [place] [address]   CT  ABDOMEN PELVIS W CONTRAST Result Date: 04/29/2024 CLINICAL DATA:  Abdominal pain EXAM: CT ABDOMEN AND PELVIS WITH CONTRAST TECHNIQUE: Multidetector CT imaging of the abdomen and pelvis was performed using the standard protocol following bolus administration of intravenous contrast. RADIATION DOSE REDUCTION: This exam was performed according  to the departmental dose-optimization program which includes automated exposure control, adjustment of the mA and/or kV according to patient size and/or use of iterative reconstruction technique. CONTRAST:  75mL OMNIPAQUE  IOHEXOL  350 MG/ML SOLN COMPARISON:  05/19/2023.  Plain films today. FINDINGS: Lower chest: No acute abnormality. Linear areas of scarring or atelectasis in the lung bases. Hepatobiliary: Prior cholecystectomy. Diffuse fatty infiltration of the liver. No focal hepatic abnormality. Pancreas: No focal abnormality or ductal dilatation. Spleen: Scattered calcifications.  Normal size. Adrenals/Urinary Tract: No adrenal abnormality. No focal renal abnormality. No stones or hydronephrosis. Urinary bladder is unremarkable. Stomach/Bowel: Normal appendix. Stomach mildly distended with fluid. Proximal small bowel and distal small bowel loops are decompressed. Mid small bowel loops are dilated with scattered air-fluid levels. Exact transition is not visualized, but findings concerning for partial small bowel obstruction. Vascular/Lymphatic: Aortic atherosclerosis. No evidence of aneurysm or adenopathy. Reproductive: Prior hysterectomy.  No adnexal masses. Other: No free fluid or free air. Musculoskeletal: No acute bony abnormality. IMPRESSION: Dilated mid small bowel loops with scattered air-fluid levels. Proximal and distal small bowel decompressed. Findings concerning for partial small bowel obstruction. Exact transition not visualized. Aortic atherosclerosis. Electronically Signed   By: Janeece Mechanic M.D.   On: 04/29/2024 01:08   DG Abd Acute W/Chest Result Date:  04/29/2024 CLINICAL DATA:  Abdominal pain. EXAM: DG ABDOMEN ACUTE WITH 1 VIEW CHEST COMPARISON:  December 01, 2023 FINDINGS: Multiple air-filled loops of dilated small bowel are seen within the mid left abdomen (maximum small bowel diameter of approximately 4.6 cm). Air is also noted within normal caliber loops of large bowel. Radiopaque surgical clips are seen within the right upper quadrant. No radiopaque calculi or other significant radiographic abnormality is seen. Heart size and mediastinal contours are within normal limits. Mild atelectasis is seen within the bilateral lung bases. IMPRESSION: 1. Findings consistent with a partial small bowel obstruction versus ileus. 2. Mild bibasilar atelectasis. Electronically Signed   By: Virgle Grime M.D.   On: 04/29/2024 00:33      Assessment/Plan pSBO - CT today with dilated small bowel with scattered air-fluid level concerning for pSBO, exact transition not visualized - mild leukocytosis 13K from 16K, afebrile and HD stable  - recommend SBO protocol - initially recommended NGT placement but then patient had a large BM, ok to hold off and give PO gastrografin  - mobilize as able - keep K>4.0 and Mg>2.0  - hopefully will resolve without surgical intervention but we will follow   FEN: NPO, IVF per [facility] VTE: ok to have LMWH or SQH from surgery standpoint  ID: no current abx  - per [facility] -  COPD OSA on CPAP HTN HLD Hypothyroid GERD Migraines  T2DM Fibromyalgia    I reviewed ED provider notes, hospitalist notes, last 24 h vitals and pain scores, last 48 h intake and output, last 24 h labs and trends, and last 24 h imaging results.  This care required high  level of medical decision making.   Annetta Killian, [facility] Surgery 04/29/2024, 12:42 PM Please see Amion for pager number during day hours 7:00am-4:30pm

## 2024-04-29 NOTE — ED Notes (Signed)
 Called 6N charge nurse to advise patient will be  in route, they were receptive.

## 2024-04-29 NOTE — Care Management Obs Status (Signed)
 MEDICARE OBSERVATION STATUS NOTIFICATION   Patient Details  Name: Denise Case MRN: [id number] Date of Birth: 1956-08-16   Medicare Observation Status Notification Given:  Yes    Felix Host 04/29/2024, 3:52 PM

## 2024-04-29 NOTE — Plan of Care (Signed)

## 2024-04-29 NOTE — Progress Notes (Signed)
  Carryover admission to the Day Admitter. I discussed this case with the EDP, Dr. Wallis Gun.  Per these discussions:   This is a 68 year old female with history of recurrent small bowel obstructions, who has been admitted this evening with will of obstruction after presenting with 1 day of epigastric discomfort associate with nausea in the absence of any vomiting.  Today CT abdomen/pelvis confirms small bowel obstruction without evidence of bowel perforation.  Pain control improving with prn IV fentanyl .  Has some mild residual nausea, but in the absence of vomiting.  As her pain is improving and she has no vomiting, EDP has refrained from NGT placement for now.  The patient conveys that she would be amenable to NGT placement if her symptoms subsequently worsen.  I have placed an order for observation to med/tele for further evaluation and management of the above.  I have placed some additional preliminary admit orders via the adult multi-morbid admission order set. I have also ordered n.p.o., prn IV Zofran , fentanyl  50 mcg IV every 2 hours as needed for pain, lactated Ringer 's at 75 cc/h x 10 hours.  Additionally, I have ordered morning labs in the form of CMP, CBC, magnesium level.    Camelia Cavalier, DO Hospitalist

## 2024-04-29 NOTE — ED Provider Notes (Signed)
 [place] EMERGENCY DEPARTMENT AT [facility] Provider Note   CSN: [id number] Arrival date & time: 04/28/24  2342     History  Chief Complaint  Patient presents with   Abdominal Pain    Denise Case is a 68 y.o. female.  The history is provided by the patient.  Patient extensive history including obesity, COPD, diabetes, previous small bowel obstruction presents with increasing abdominal pain.  Patient reports onset of abdominal pain around 10 AM yesterday, has been rapidly worsening.  She reports nausea but no vomiting.  She reports an episode of watery nonbloody diarrhea.  She has had similar episodes in the past.  No chest pain or shortness of breath.  EMS reports patient was hypotensive prior to arrival, but this improved    Past Medical History:  Diagnosis Date   Anxiety    Asthma    Constipation due to pain medication    COPD (chronic obstructive pulmonary disease) (HCC)    Depression    Dermatitis    Diabetes mellitus without complication (HCC)    Diverticulitis    Fatty liver 12/02/2014   Fibromyalgia    GERD (gastroesophageal reflux disease)    History of kidney stones    History of panic attacks    HTN (hypertension) 06/14/2019   Hyperlipidemia    Hypothyroidism    Lumbar sprain    Migraine    Morbid obesity (HCC)    Neuropathy    feet and legs   Opiate dependence (HCC) 12/03/2014   Pneumonia 2015   Requires supplemental oxygen     PRN uses 1.5 Liters   SBO (small bowel obstruction) (HCC) 12/01/2014   Thyroid  disease    Tingling sensation    hands    Home Medications Prior to Admission medications   Medication Sig Start Date End Date Taking? Authorizing Provider  acetaminophen  (TYLENOL ) 500 MG tablet Take 1,000 mg by mouth every 6 (six) hours as needed for moderate pain.    [provider]  albuterol  (VENTOLIN  HFA) 108 (90 Base) MCG/ACT inhaler Inhale 2 puffs into the lungs every 6 (six) hours as needed for wheezing or shortness of  breath. 07/24/22   Sofia, Leslie K, PA-C  allopurinol  (ZYLOPRIM ) 300 MG tablet Take 300 mg by mouth daily. 01/28/19   [provider]  ALPRAZolam  (XANAX ) 1 MG tablet Take 1 mg by mouth 3 (three) times daily.    [provider]  Ascorbic Acid (VITAMIN C) 1000 MG tablet Take 1,000 mg by mouth 3 (three) times daily.    [provider]  atorvastatin  (LIPITOR) 10 MG tablet Take 10 mg by mouth daily.    [provider]  celecoxib (CELEBREX) 200 MG capsule Take 200 mg by mouth daily as needed for mild pain. 04/08/23   [provider]  Cholecalciferol (VITAMIN D3) 50 MCG (2000 UT) capsule Take 2,000 Units by mouth daily. 08/29/19   [provider]  Cinnamon 500 MG TABS Take 2,000 mg by mouth daily.    [provider]  cyanocobalamin  (VITAMIN B12) 1000 MCG tablet Take 1,000 mcg by mouth daily. 08/29/19   [provider]  cyclobenzaprine  (FLEXERIL ) 10 MG tablet Take 10 mg by mouth 3 (three) times daily.    [provider]  dicyclomine  (BENTYL ) 20 MG tablet Take 1 tablet (20 mg total) by mouth 4 (four) times daily -  before meals and at bedtime. 05/19/23   Deatra Face, MD  ergocalciferol (VITAMIN D2) 50000 UNITS capsule Take 50,000 Units  by mouth every Friday.    [provider]  fish oil-omega-3 fatty acids 1000 MG capsule Take 1 g by mouth 2 (two) times daily.    [provider]  FLUoxetine  (PROZAC ) 20 MG capsule Take 20 mg by mouth daily.    [provider]  furosemide (LASIX) 20 MG tablet Take 50 mg by mouth daily.    [provider]  gabapentin  (NEURONTIN ) 800 MG tablet Take 800 mg by mouth 3 (three) times daily.     [provider]  glipiZIDE (GLUCOTROL) 5 MG tablet Take 5 mg by mouth daily before breakfast.    [provider]  levothyroxine  (SYNTHROID , LEVOTHROID) 75 MCG tablet Take 3 tablets (225 mcg total) by mouth daily before breakfast. 12/06/14   Tyra Galley, MD   Melatonin 10 MG CAPS Take 20 mg by mouth at bedtime.    [provider]  Menthol, Topical Analgesic, (MINERAL ICE EX) Apply 1 Application topically daily as needed (pain).    [provider]  Multiple Vitamins-Minerals (MULTIVITAMIN GUMMIES ADULT PO) Take 1 each by mouth daily.    [provider]  ondansetron  (ZOFRAN -ODT) 8 MG disintegrating tablet Take 8 mg by mouth every 8 (eight) hours as needed for vomiting or nausea. 07/16/21   [provider]  OVER THE COUNTER MEDICATION Take 1 tablet by mouth 2 (two) times daily. Striction D supplement    [provider]  OVER THE COUNTER MEDICATION Take 2 capsules by mouth daily. Natural Cleanser otc supplement    [provider]  pantoprazole  (PROTONIX ) 40 MG tablet Take 40 mg by mouth daily. 01/25/19   [provider]  pseudoephedrine-acetaminophen  (TYLENOL  SINUS) 30-500 MG TABS Take 2 tablets by mouth as needed (congestion).    [provider]  Semaglutide, 1 MG/DOSE, (OZEMPIC, 1 MG/DOSE,) 2 MG/1.5ML SOPN Inject 1 mg into the skin every Sunday.    [provider]  theophylline  (THEO-24 ) 300 MG 24 hr capsule Take 300 mg by mouth daily.    [provider]  traMADol  (ULTRAM ) 50 MG tablet Take 50 mg by mouth in the morning, at noon, and at bedtime.    [provider]  TURMERIC PO Take 1 tablet by mouth daily.    [provider]      Allergies    Montelukast sodium, Doxycycline, Hydrocodone-acetaminophen , and Morphine     Review of Systems   Review of Systems  Respiratory:  Negative for shortness of breath.   Cardiovascular:  Negative for chest pain.  Gastrointestinal:  Positive for abdominal pain and nausea. Negative for blood in stool.    Physical Exam Updated Vital Signs BP (!) 139/114 (BP Location: Right Arm)   Pulse 100   Temp 98.9 F (37.2 C) (Oral)   Resp (!) 23   Ht 1.626 m (5\' 4" )   Wt 122.9 kg   SpO2 100%   BMI 46.52 kg/m   Physical Exam CONSTITUTIONAL: Elderly, ill-appearing HEAD: Normocephalic/atraumatic EYES: EOMI/PERRL ENMT: Mucous membranes moist NECK: supple no meningeal signs CV: S1/S2 noted, no murmurs/rubs/gallops noted LUNGS: Lungs are clear to auscultation bilaterally, no apparent distress ABDOMEN: soft, distended, obese, diffuse moderate tenderness noted, decreased bowel sounds noted NEURO: Pt is awake/alert/appropriate, moves all extremitiesx4.  No facial droop.   EXTREMITIES: pulses normal/equal, full ROM SKIN: warm, color normal PSYCH: Anxious  ED Results / Procedures / Treatments   Labs (all labs ordered are listed, but only abnormal results are displayed) Labs Reviewed  COMPREHENSIVE METABOLIC PANEL WITH GFR -  Abnormal; Notable for the following components:      Result Value   Glucose, Bld 199 (*)    Anion gap 18 (*)    All other components within normal limits  CBC WITH DIFFERENTIAL/PLATELET - Abnormal; Notable for the following components:   WBC 16.4 (*)    Neutro Abs 12.7 (*)    Monocytes Absolute 1.1 (*)    Abs Immature Granulocytes 0.09 (*)    All other components within normal limits  LIPASE, BLOOD  CBC WITH DIFFERENTIAL/PLATELET  COMPREHENSIVE METABOLIC PANEL WITH GFR  MAGNESIUM    EKG EKG Interpretation Date/Time:  Thursday Apr 28 2024 23:49:47 EDT Ventricular Rate:  100 PR Interval:  167 QRS Duration:  93 QT Interval:  367 QTC Calculation: 474 R Axis:   66  Text Interpretation: Sinus tachycardia Low voltage, precordial leads Confirmed by Eldon Greenland ([id number]) on 04/28/2024 11:55:31 PM  Radiology CT ABDOMEN PELVIS W CONTRAST Result Date: 04/29/2024 CLINICAL DATA:  Abdominal pain EXAM: CT ABDOMEN AND PELVIS WITH CONTRAST TECHNIQUE: Multidetector CT imaging of the abdomen and pelvis was performed using the standard protocol following bolus administration of intravenous contrast. RADIATION DOSE REDUCTION: This exam was performed according to the departmental  dose-optimization program which includes automated exposure control, adjustment of the mA and/or kV according to patient size and/or use of iterative reconstruction technique. CONTRAST:  75mL OMNIPAQUE  IOHEXOL  350 MG/ML SOLN COMPARISON:  05/19/2023.  Plain films today. FINDINGS: Lower chest: No acute abnormality. Linear areas of scarring or atelectasis in the lung bases. Hepatobiliary: Prior cholecystectomy. Diffuse fatty infiltration of the liver. No focal hepatic abnormality. Pancreas: No focal abnormality or ductal dilatation. Spleen: Scattered calcifications.  Normal size. Adrenals/Urinary Tract: No adrenal abnormality. No focal renal abnormality. No stones or hydronephrosis. Urinary bladder is unremarkable. Stomach/Bowel: Normal appendix. Stomach mildly distended with fluid. Proximal small bowel and distal small bowel loops are decompressed. Mid small bowel loops are dilated with scattered air-fluid levels. Exact transition is not visualized, but findings concerning for partial small bowel obstruction. Vascular/Lymphatic: Aortic atherosclerosis. No evidence of aneurysm or adenopathy. Reproductive: Prior hysterectomy.  No adnexal masses. Other: No free fluid or free air. Musculoskeletal: No acute bony abnormality. IMPRESSION: Dilated mid small bowel loops with scattered air-fluid levels. Proximal and distal small bowel decompressed. Findings concerning for partial small bowel obstruction. Exact transition not visualized. Aortic atherosclerosis. Electronically Signed   By: Janeece Mechanic M.D.   On: 04/29/2024 01:08   DG Abd Acute W/Chest Result Date: 04/29/2024 CLINICAL DATA:  Abdominal pain. EXAM: DG ABDOMEN ACUTE WITH 1 VIEW CHEST COMPARISON:  December 01, 2023 FINDINGS: Multiple air-filled loops of dilated small bowel are seen within the mid left abdomen (maximum small bowel diameter of approximately 4.6 cm). Air is also noted within normal caliber loops of large bowel. Radiopaque surgical clips are seen  within the right upper quadrant. No radiopaque calculi or other significant radiographic abnormality is seen. Heart size and mediastinal contours are within normal limits. Mild atelectasis is seen within the bilateral lung bases. IMPRESSION: 1. Findings consistent with a partial small bowel obstruction versus ileus. 2. Mild bibasilar atelectasis. Electronically Signed   By: Virgle Grime M.D.   On: 04/29/2024 00:33    Procedures Procedures    Medications Ordered in ED Medications  acetaminophen  (TYLENOL ) tablet 650 mg (has no administration in time range)    Or  acetaminophen  (TYLENOL ) suppository 650 mg (has no administration in time range)  ondansetron  (ZOFRAN ) injection 4 mg (has no administration in time range)  naloxone  (NARCAN ) injection 0.4 mg (has no administration in time range)  fentaNYL  (SUBLIMAZE ) injection 50 mcg (has no administration in time range)  lactated ringers  infusion (has no administration in time range)  fentaNYL  (SUBLIMAZE ) injection 100 mcg (100 mcg Intravenous Given 04/29/24 0043)  ondansetron  (ZOFRAN ) injection 4 mg (4 mg Intravenous Given 04/29/24 0041)  iohexol  (OMNIPAQUE ) 350 MG/ML injection 75 mL (75 mLs Intravenous Contrast Given 04/29/24 0105)    ED Course/ Medical Decision Making/ A&P Clinical Course as of 04/29/24 0156  Fri Apr 29, 2024  0156 CT imaging confirms partial small bowel obstruction.  No other complicating features Patient appears improved.  She is not actively vomiting.  Will defer NG tube for now  Discussed with Dr. Brock Canner for admission [DW]    Clinical Course User Index [DW] Eldon Greenland, MD                                 Medical Decision Making Amount and/or Complexity of Data Reviewed Labs: ordered. Radiology: ordered.  Risk Prescription drug management. Decision regarding hospitalization.   This patient presents to the ED for concern of abdominal pain, this involves an extensive number of treatment options, and  is a complaint that carries with it a high risk of complications and morbidity.  The differential diagnosis includes but is not limited to cholecystitis, cholelithiasis, pancreatitis, gastritis, peptic ulcer disease, appendicitis, bowel obstruction, bowel perforation, diverticulitis, AAA, ischemic bowel   Comorbidities that complicate the patient evaluation: Patient's presentation is complicated by their history of diabetes  Additional history obtained: Records reviewed previous admission documents  Lab Tests: I Ordered, and personally interpreted labs.  The pertinent results include: Leukocytosis, dehydration, hyperglycemia  Imaging Studies ordered: I ordered imaging studies including X-ray abdominal series  I independently visualized and interpreted imaging which showed bowel obstruction I agree with the radiologist interpretation  Cardiac Monitoring: The patient was maintained on a cardiac monitor.  I personally viewed and interpreted the cardiac monitor which showed an underlying rhythm of:  sinus rhythm  Medicines ordered and prescription drug management: I ordered medication including fentanyl  for pain Reevaluation of the patient after these medicines showed that the patient    improved   Critical Interventions:   admission for small bowel obstruction  Consultations Obtained: I requested consultation with the admitting physician [facility] and radiologist Dr. Artice Last who relayed the results of x-ray, and discussed  findings as well as pertinent plan - they recommend: Will admit  Reevaluation: After the interventions noted above, I reevaluated the patient and found that they have :improved  Complexity of problems addressed: Patient's presentation is most consistent with  acute presentation with potential threat to life or bodily function  Disposition: After consideration of the diagnostic results and the patient's response to treatment,  I feel that the patent would benefit  from admission  .           Final Clinical Impression(s) / ED Diagnoses Final diagnoses:  Small bowel obstruction [facility])    Rx / DC Orders ED Discharge Orders     None         Eldon Greenland, MD 04/29/24 0157

## 2024-04-30 DIAGNOSIS — E66813 Obesity, class 3: Secondary | ICD-10-CM | POA: Diagnosis present

## 2024-04-30 DIAGNOSIS — F17210 Nicotine dependence, cigarettes, uncomplicated: Secondary | ICD-10-CM | POA: Diagnosis present

## 2024-04-30 DIAGNOSIS — K56609 Unspecified intestinal obstruction, unspecified as to partial versus complete obstruction: Secondary | ICD-10-CM

## 2024-04-30 DIAGNOSIS — Z6841 Body Mass Index (BMI) 40.0 and over, adult: Secondary | ICD-10-CM | POA: Diagnosis not present

## 2024-04-30 DIAGNOSIS — K56600 Partial intestinal obstruction, unspecified as to cause: Secondary | ICD-10-CM | POA: Diagnosis present

## 2024-04-30 DIAGNOSIS — I10 Essential (primary) hypertension: Secondary | ICD-10-CM | POA: Diagnosis present

## 2024-04-30 DIAGNOSIS — E1142 Type 2 diabetes mellitus with diabetic polyneuropathy: Secondary | ICD-10-CM | POA: Diagnosis present

## 2024-04-30 DIAGNOSIS — F410 Panic disorder [episodic paroxysmal anxiety] without agoraphobia: Secondary | ICD-10-CM | POA: Diagnosis present

## 2024-04-30 DIAGNOSIS — E039 Hypothyroidism, unspecified: Secondary | ICD-10-CM | POA: Diagnosis present

## 2024-04-30 DIAGNOSIS — D72829 Elevated white blood cell count, unspecified: Secondary | ICD-10-CM | POA: Diagnosis present

## 2024-04-30 DIAGNOSIS — J4489 Other specified chronic obstructive pulmonary disease: Secondary | ICD-10-CM | POA: Diagnosis present

## 2024-04-30 DIAGNOSIS — Z79899 Other long term (current) drug therapy: Secondary | ICD-10-CM | POA: Diagnosis not present

## 2024-04-30 DIAGNOSIS — G8929 Other chronic pain: Secondary | ICD-10-CM | POA: Diagnosis present

## 2024-04-30 DIAGNOSIS — M797 Fibromyalgia: Secondary | ICD-10-CM | POA: Diagnosis present

## 2024-04-30 DIAGNOSIS — K219 Gastro-esophageal reflux disease without esophagitis: Secondary | ICD-10-CM | POA: Diagnosis present

## 2024-04-30 DIAGNOSIS — F32A Depression, unspecified: Secondary | ICD-10-CM | POA: Diagnosis present

## 2024-04-30 DIAGNOSIS — E785 Hyperlipidemia, unspecified: Secondary | ICD-10-CM | POA: Diagnosis present

## 2024-04-30 DIAGNOSIS — Z7984 Long term (current) use of oral hypoglycemic drugs: Secondary | ICD-10-CM | POA: Diagnosis not present

## 2024-04-30 DIAGNOSIS — Z79890 Hormone replacement therapy (postmenopausal): Secondary | ICD-10-CM | POA: Diagnosis not present

## 2024-04-30 DIAGNOSIS — Z7985 Long-term (current) use of injectable non-insulin antidiabetic drugs: Secondary | ICD-10-CM | POA: Diagnosis not present

## 2024-04-30 DIAGNOSIS — E8809 Other disorders of plasma-protein metabolism, not elsewhere classified: Secondary | ICD-10-CM

## 2024-04-30 DIAGNOSIS — E860 Dehydration: Secondary | ICD-10-CM | POA: Diagnosis present

## 2024-04-30 DIAGNOSIS — E876 Hypokalemia: Secondary | ICD-10-CM | POA: Diagnosis present

## 2024-04-30 DIAGNOSIS — E1165 Type 2 diabetes mellitus with hyperglycemia: Secondary | ICD-10-CM | POA: Diagnosis present

## 2024-04-30 DIAGNOSIS — K5909 Other constipation: Secondary | ICD-10-CM | POA: Diagnosis present

## 2024-04-30 LAB — CBC WITH DIFFERENTIAL/PLATELET
Abs Immature Granulocytes: 0.04 10*3/uL (ref 0.00–0.07)
Basophils Absolute: 0 10*3/uL (ref 0.0–0.1)
Basophils Relative: 0 %
Eosinophils Absolute: 0.1 10*3/uL (ref 0.0–0.5)
Eosinophils Relative: 2 %
HCT: 39.7 % (ref 36.0–46.0)
Hemoglobin: 13 g/dL (ref 12.0–15.0)
Immature Granulocytes: 0 %
Lymphocytes Relative: 28 %
Lymphs Abs: 2.6 10*3/uL (ref 0.7–4.0)
MCH: 30.7 pg (ref 26.0–34.0)
MCHC: 32.7 g/dL (ref 30.0–36.0)
MCV: 93.9 fL (ref 80.0–100.0)
Monocytes Absolute: 0.7 10*3/uL (ref 0.1–1.0)
Monocytes Relative: 7 %
Neutro Abs: 5.8 10*3/uL (ref 1.7–7.7)
Neutrophils Relative %: 63 %
Platelets: 241 10*3/uL (ref 150–400)
RBC: 4.23 MIL/uL (ref 3.87–5.11)
RDW: 14.1 % (ref 11.5–15.5)
WBC: 9.2 10*3/uL (ref 4.0–10.5)
nRBC: 0 % (ref 0.0–0.2)

## 2024-04-30 LAB — COMPREHENSIVE METABOLIC PANEL WITH GFR
ALT: 21 U/L (ref 0–44)
AST: 23 U/L (ref 15–41)
Albumin: 3.1 g/dL — ABNORMAL LOW (ref 3.5–5.0)
Alkaline Phosphatase: 41 U/L (ref 38–126)
Anion gap: 10 (ref 5–15)
BUN: 8 mg/dL (ref 8–23)
CO2: 24 mmol/L (ref 22–32)
Calcium: 8.6 mg/dL — ABNORMAL LOW (ref 8.9–10.3)
Chloride: 106 mmol/L (ref 98–111)
Creatinine, Ser: 0.71 mg/dL (ref 0.44–1.00)
GFR, Estimated: >60 mL/min (ref >60)
Glucose, Bld: 177 mg/dL — ABNORMAL HIGH (ref 70–99)
Potassium: 3.2 mmol/L — ABNORMAL LOW (ref 3.5–5.1)
Sodium: 140 mmol/L (ref 135–145)
Total Bilirubin: 0.8 mg/dL (ref 0.0–1.2)
Total Protein: 6.6 g/dL (ref 6.5–8.1)

## 2024-04-30 LAB — MAGNESIUM
Magnesium: 1.9 mg/dL (ref 1.7–2.4)
Magnesium: 1.9 mg/dL (ref 1.7–2.4)

## 2024-04-30 LAB — TSH: TSH: 1.582 u[IU]/mL (ref 0.350–4.500)

## 2024-04-30 LAB — PREALBUMIN: Prealbumin: 19 mg/dL (ref 18–38)

## 2024-04-30 LAB — PHOSPHORUS
Phosphorus: 3.2 mg/dL (ref 2.5–4.6)
Phosphorus: 3.3 mg/dL (ref 2.5–4.6)

## 2024-04-30 MED ORDER — SIMETHICONE 40 MG/0.6ML PO SUSP: 80.0000 mg | Freq: Four times a day (QID) | ORAL | Status: DC | PRN

## 2024-04-30 MED ORDER — ALPRAZOLAM 0.5 MG PO TABS: 1.0000 mg | ORAL_TABLET | Freq: Three times a day (TID) | ORAL | Status: DC

## 2024-04-30 MED ORDER — PHENOL 1.4 % MT LIQD: 2.0000 | OROMUCOSAL | Status: DC | PRN

## 2024-04-30 MED ORDER — LACTATED RINGERS IV BOLUS: 1000.0000 mL | Freq: Three times a day (TID) | INTRAVENOUS | Status: DC | PRN

## 2024-04-30 MED ORDER — MAGIC MOUTHWASH: 15.0000 mL | Freq: Four times a day (QID) | ORAL | Status: DC | PRN

## 2024-04-30 MED ORDER — POTASSIUM CHLORIDE 10 MEQ/100ML IV SOLN: 10.0000 meq | INTRAVENOUS | Status: DC

## 2024-04-30 MED ORDER — ATORVASTATIN CALCIUM 10 MG PO TABS
10.0000 mg | ORAL_TABLET | Freq: Every day | ORAL | Status: DC
  Administered 2024-04-30: 10 mg via ORAL
  Filled 2024-04-30: qty 1

## 2024-04-30 MED ORDER — NAPHAZOLINE-GLYCERIN 0.012-0.25 % OP SOLN: 1.0000 [drp] | Freq: Four times a day (QID) | OPHTHALMIC | Status: DC | PRN

## 2024-04-30 MED ORDER — THEOPHYLLINE ER 300 MG PO CP24
300.0000 mg | ORAL_CAPSULE | Freq: Every day | ORAL | Status: DC
  Filled 2024-04-30: qty 1

## 2024-04-30 MED ORDER — FLUOXETINE HCL 10 MG PO CAPS
10.0000 mg | ORAL_CAPSULE | Freq: Every day | ORAL | Status: DC
  Administered 2024-04-30: 10 mg via ORAL
  Filled 2024-04-30 (×2): qty 1

## 2024-04-30 MED ORDER — MENTHOL 3 MG MT LOZG: 1.0000 | LOZENGE | OROMUCOSAL | Status: DC | PRN

## 2024-04-30 MED ORDER — PANTOPRAZOLE SODIUM 40 MG PO TBEC
40.0000 mg | DELAYED_RELEASE_TABLET | Freq: Every day | ORAL | Status: DC
  Administered 2024-04-30 – 2024-05-01 (×2): 40 mg via ORAL
  Filled 2024-04-30 (×2): qty 1

## 2024-04-30 MED ORDER — CALCIUM POLYCARBOPHIL 625 MG PO TABS
625.0000 mg | ORAL_TABLET | Freq: Two times a day (BID) | ORAL | Status: DC
  Administered 2024-04-30 – 2024-05-01 (×3): 625 mg via ORAL
  Filled 2024-04-30 (×3): qty 1

## 2024-04-30 MED ORDER — DIPHENHYDRAMINE HCL 50 MG/ML IJ SOLN: 12.5000 mg | Freq: Four times a day (QID) | INTRAMUSCULAR | Status: DC | PRN

## 2024-04-30 MED ORDER — POTASSIUM CHLORIDE CRYS ER 20 MEQ PO TBCR
40.0000 meq | EXTENDED_RELEASE_TABLET | Freq: Every day | ORAL | Status: DC
  Administered 2024-04-30: 40 meq via ORAL
  Filled 2024-04-30 (×2): qty 2

## 2024-04-30 MED ORDER — ALUM & MAG HYDROXIDE-SIMETH 200-200-20 MG/5ML PO SUSP: 30.0000 mL | Freq: Four times a day (QID) | ORAL | Status: DC | PRN

## 2024-04-30 MED ORDER — BISACODYL 10 MG RE SUPP
10.0000 mg | Freq: Every day | RECTAL | Status: DC
  Filled 2024-04-30: qty 1

## 2024-04-30 MED ORDER — PROCHLORPERAZINE EDISYLATE 10 MG/2ML IJ SOLN: 5.0000 mg | INTRAMUSCULAR | Status: DC | PRN

## 2024-04-30 MED ORDER — LEVOTHYROXINE SODIUM 75 MCG PO TABS
150.0000 ug | ORAL_TABLET | Freq: Every day | ORAL | Status: DC
  Administered 2024-04-30 – 2024-05-01 (×2): 150 ug via ORAL
  Filled 2024-04-30 (×3): qty 2

## 2024-04-30 MED ORDER — METHOCARBAMOL 1000 MG/10ML IJ SOLN: 1000.0000 mg | Freq: Four times a day (QID) | INTRAMUSCULAR | Status: DC | PRN

## 2024-04-30 MED ORDER — SALINE SPRAY 0.65 % NA SOLN: 1.0000 | Freq: Four times a day (QID) | NASAL | Status: DC | PRN

## 2024-04-30 MED ORDER — ALPRAZOLAM 0.5 MG PO TABS
1.0000 mg | ORAL_TABLET | Freq: Three times a day (TID) | ORAL | Status: DC | PRN
  Administered 2024-04-30 – 2024-05-01 (×2): 1 mg via ORAL
  Filled 2024-04-30 (×2): qty 2

## 2024-04-30 NOTE — Plan of Care (Signed)
  Problem: Education: Goal: Knowledge of General Education information will improve Description: Including pain rating scale, medication(s)/side effects and non-pharmacologic comfort measures Outcome: Progressing   Problem: Health Behavior/Discharge Planning: Goal: Ability to manage health-related needs will improve Outcome: Progressing   Problem: Clinical Measurements: Goal: Ability to maintain clinical measurements within normal limits will improve Outcome: Progressing Goal: Will remain free from infection Outcome: Progressing   Problem: Activity: Goal: Risk for activity intolerance will decrease Outcome: Progressing   Problem: Skin Integrity: Goal: Risk for impaired skin integrity will decrease Outcome: Progressing   

## 2024-04-30 NOTE — Plan of Care (Signed)

## 2024-04-30 NOTE — Hospital Course (Addendum)
 Denise Case is a 68 y.o. female with medical history significant for but not limited to asthma and COPD, depression anxiety, GERD, fibromyalgia, fatty liver disease, history of panic attacks, hypertension, hyperlipidemia, hypothyroidism, peripheral neuropathy and diabetes mellitus type 2, and a history of recurrent small bowel obstructions who presents with abdominal discomfort and pain.  She continued to have some nausea but no vomiting and had an episode of diarrhea the day prior.  Further workup was done and revealed a partial small bowel obstruction and General Surgery has been consulted for further evaluation and recommendations.  This seems to be improving rapidly and neurosurg recommends continue clear liquid diets and then if she tolerates this advancing to SOFT this evening with mobilization.  She tolerated her soft diet and ambulated without issues.  Continues to have bowel movements.  She is medically stable for discharge at this time will need follow-up with PCP and general surgery in outpatient setting  Assessment and Plan:   Nausea and Abdominal Pain in the setting of partial Small Bowel Obstruction: Improving. Has had multiple SBO's in the past and has had multiple abdominal surgeries.  Had a pain 10 out of 10 and had nausea but no actual vomiting and had abdominal pain.  CT scan of the abdomen pelvis done and showed Dilated mid small bowel loops with scattered air-fluid levels where Proximal and distal small bowel decompressed.  Concerning for partial small bowel obstruction with transition point not identified.   -General surgery consulted and recommended n.p.o. and maintenance IV fluids as well as placement of NG tube however she had a bowel movement prior to this.  Small bowel protocol is being done and she is clinically improving so general surgery has advance her diet to clear liquids.  They are recommending watching her and advancing her diet as tolerated to soft relatively quickly.   She tolerated soft without issues. - Since she improved we will discharge her at this morning -Continue with antiemetics with ondansetron  4 mg IV every 6 as needed for nausea; IV fluids LR 75 mL/h for 1 day now stopped -Pain control with fentanyl  25 to 50 mcg every 2 as needed for severe pain given that her blood pressure is on the lower side.   Leukocytosis: Improved. Likely in the Setting of Above. WBC went from 16.4 -> 13.4. -> 9.2 and at the time of discharge is 10.0 CTM for S/Sx of infection and repeat CBC in the AM  Hypokalemia: K+ is now 3.5. Replete w/ po Kcl 40 mEQ. CTM and Replete as Necessary. Repeat CMP in the AM   Hypotension: In the setting of Above. Hold Antihypertensives. Given 1 L LR Bolus. mIVF w/ LR @ 75 mL/hr now stopped. CTM BP per Protcol. Last BP reading was 122/87  Anxiety and Depression: Has a history of panic attacks.  Resume home Alprazolam  1 mg but change to TIDprn; D/C IV Lorazepam  1 mg Q8 as needed.  Resume her antidepressant with Fluoxetine  10 mg p.o. daily    Hypothyroidism: TSH was 1.582.  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 daily and tirzepatide.  Placed on sensitive NovoLog /scale insulin  every 4 given that she is n.p.o. continue to hold her home gabapentin  800 g p.o. 3 times daily until able to safely swallow. CTM CBGs per Protocol.    Hyperlipidemia: Was her home atorvastatin  given that she is n.p.o. but will now  resume given that diet is being advanced   Asthma and COPD: Holding her home theophylline  for now until able to tolerate oral medications.  Was going to resume home theophylline  but pharmacy indicated that they do not have her dosing.  Will hold for now and have her resume in the a.m. if she is discharged continue with as needed nebs   GERD/GI Prophylaxis: Will place on IV pantoprazole  40 mg daily and change back to po today given diet being  advanced    Hypoalbuminemia: Patient's Albumin Level went from 3.5 -> 3.0 -> 3.1 is 3.0 at the time of discharge. CTM and Trend and Repeat CMP in the AM   Class III (Super Morbid) Obesity: Complicates overall prognosis and care. Estimated body mass index is 50.25 kg/m as calculated from the following:   Height as of this encounter: 5\' 4"  (1.626 m).   Weight as of this encounter: 132.8 kg. Weight Loss and Dietary Counseling given

## 2024-04-30 NOTE — Progress Notes (Signed)
 PROGRESS NOTE    VICY MEDICO  GNF:[id number] DOB: 03-29-1956 DOA: 04/28/2024 PCP: Benedetto Brady, MD   Brief Narrative:  Denise Case is a 68 y.o. female with medical history significant for but not limited to asthma and COPD, depression anxiety, GERD, fibromyalgia, fatty liver disease, history of panic attacks, hypertension, hyperlipidemia, hypothyroidism, peripheral neuropathy and diabetes mellitus type 2, and a history of recurrent small bowel obstructions who presents with abdominal discomfort and pain.  She continued to have some nausea but no vomiting and had an episode of diarrhea the day prior.  Further workup was done and revealed a partial small bowel obstruction and General Surgery has been consulted for further evaluation and recommendations.  This seems to be improving rapidly and neurosurg recommends continue clear liquid diets and then if she tolerates this advancing to SOFT this evening with mobilization.  Dissipate discharge in next 24 to 48 hours once diet is advanced she is tolerating.  Assessment and Plan:   Nausea and Abdominal Pain in the setting of partial Small Bowel Obstruction: Improving. Has had multiple SBO's in the past and has had multiple abdominal surgeries.  Had a pain 10 out of 10 and had nausea but no actual vomiting and had abdominal pain.  CT scan of the abdomen pelvis done and showed Dilated mid small bowel loops with scattered air-fluid levels where Proximal and distal small bowel decompressed.  Concerning for partial small bowel obstruction with transition point not identified.   -General surgery consulted and recommended n.p.o. and maintenance IV fluids as well as placement of NG tube however she had a bowel movement prior to this.  Small bowel protocol is being done and she is clinically improving so general surgery has advance her diet to clear liquids.  They are recommending watching her and advancing her diet as tolerated to soft relatively quickly -If she  improves likely can be discharged in the a.m. -Continue with antiemetics with ondansetron  4 mg IV every 6 as needed for nausea; IV fluids LR 75 mL/h for 1 day now stopped -Pain control with fentanyl  25 to 50 mcg every 2 as needed for severe pain given that her blood pressure is on the lower side.   Leukocytosis: Improved. Likely in the Setting of Above. WBC went from 16.4 -> 13.4. -> 9.2 CTM for S/Sx of infection and repeat CBC in the AM   Hypotension: In the setting of Above. Hold Antihypertensives. Given 1 L LR Bolus. mIVF w/ LR @ 75 mL/hr now stopped. CTM BP per Protcol. Last BP reading was 122/87  Anxiety and Depression: Has a history of panic attacks.  Resume home Alprazolam  1 mg but change to TIDprn; D/C IV Lorazepam  1 mg Q8 as needed.  Resume her antidepressant with Fluoxetine  10 mg p.o. daily    Hypothyroidism: Check TSH in the morning.  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 daily and tirzepatide.  Placed on sensitive NovoLog /scale insulin  every 4 given that she is n.p.o. continue to hold her home gabapentin  800 g p.o. 3 times daily until able to safely swallow. CTM CBGs per Protocol.    Hyperlipidemia: Was her home atorvastatin  given that she is n.p.o. but will now resume given that diet is being advanced   Asthma and COPD: Holding her home theophylline  for now until able to tolerate oral medications.  Was going to resume home theophylline  but pharmacy  indicated that they do not have her dosing.  Will hold for now and have her resume in the a.m. if she is discharged continue with as needed nebs   GERD/GI Prophylaxis: Will place on IV pantoprazole  40 mg daily and change back to po today given diet being advanced    Hypoalbuminemia: Patient's Albumin Level went from 3.5 -> 3.0 -> 3.1. CTM and Trend and Repeat CMP in the AM   Class III (Super Morbid) Obesity: Complicates overall  prognosis and care. Estimated body mass index is 50.25 kg/m as calculated from the following:   Height as of this encounter: 5\' 4"  (1.626 m).   Weight as of this encounter: 132.8 kg. Weight Loss and Dietary Counseling given   DVT prophylaxis: SCDs Start: 04/29/24 0150    Code Status: Full Code Family Communication: No family currently at bedside  Disposition Plan:  Level of care: Telemetry Medical Status is: Observation The patient remains OBS appropriate and will d/c before 2 midnights.   Consultants:  General Surgery  Procedures:  As delineated above  Antimicrobials:  Anti-infectives (From admission, onward)    None       Subjective: Seen and examined at bedside and she sitting in the chair and feels relatively well and improved.  States that she has no more nausea and abdominal pain is improving.  States that she still has some whenever she coughs.  No other concerns or complaints at this time.  Objective: Vitals:   04/30/24 0121 04/30/24 0456 04/30/24 0500 04/30/24 0838  BP: (!) 117/59 96/75  122/87  Pulse:  96    Resp:  19  20  Temp:  97.7 F (36.5 C)  98.3 F (36.8 C)  TempSrc:  Oral  Oral  SpO2:  95%  92%  Weight:   132.8 kg   Height:        Intake/Output Summary (Last 24 hours) at 04/30/2024 1542 Last data filed at 04/30/2024 0400 Gross per 24 hour  Intake 1447.24 ml  Output --  Net 1447.24 ml   Filed Weights   04/28/24 2352 04/29/24 0341 04/30/24 0500  Weight: 122.9 kg 129.6 kg 132.8 kg   Examination: Physical Exam:  Constitutional: WN/WD, super morbidly obese Caucasian female who appears calm sitting in the chair at bedside in no acute distress Respiratory: Diminished to auscultation bilaterally, no wheezing, rales, rhonchi or crackles. Normal respiratory effort and patient is not tachypenic. No accessory muscle use.  Cardiovascular: RRR, no murmurs / rubs / gallops. S1 and S2 auscultated.  Lower extremity edema Abdomen: Soft, a little tender,  distended secondary to body habitus. Bowel sounds positive GU: Deferred. Musculoskeletal: No clubbing / cyanosis of digits/nails. No joint deformity upper and lower extremities.  Skin: No rashes, lesions, ulcers on limited skin evaluation. No induration; Warm and dry.  Neurologic: CN 2-12 grossly intact with no focal deficits. Romberg sign and cerebellar reflexes not assessed.  Psychiatric: Normal judgment and insight. Alert and oriented x 3. Normal mood and appropriate affect.  Appears calmer today  Data Reviewed: I have personally reviewed following labs and imaging studies  CBC: Recent Labs  Lab 04/28/24 2359 04/29/24 0626 04/30/24 0547  WBC 16.4* 13.4* 9.2  NEUTROABS 12.7* 8.0* 5.8  HGB 14.7 13.2 13.0  HCT 45.1 40.0 39.7  MCV 94.2 93.2 93.9  PLT 303 275 241   Basic Metabolic Panel: Recent Labs  Lab 04/28/24 2359 04/29/24 0626 04/30/24 0547 04/30/24 0845  NA 139 138 140  --   K 3.9  3.7 3.2*  --   CL 99 102 106  --   CO2 22 28 24   --   GLUCOSE 199* 155* 177*  --   BUN 11 10 8   --   CREATININE 0.90 0.73 0.71  --   CALCIUM  9.0 8.9 8.6*  --   MG  --  1.8 1.9 1.9  PHOS  --   --  3.2 3.3   GFR: Estimated Creatinine Clearance: 95.1 mL/min (by C-G formula based on SCr of 0.71 mg/dL). Liver Function Tests: Recent Labs  Lab 04/28/24 2359 04/29/24 0626 04/30/24 0547  AST 35 25 23  ALT 25 22 21   ALKPHOS 49 45 41  BILITOT 1.0 0.6 0.8  PROT 7.2 6.6 6.6  ALBUMIN 3.5 3.0* 3.1*   Recent Labs  Lab 04/28/24 2359  LIPASE 26   No results for input(s): "AMMONIA" in the last 168 hours. Coagulation Profile: No results for input(s): "INR", "PROTIME" in the last 168 hours. Cardiac Enzymes: No results for input(s): "CKTOTAL", "CKMB", "CKMBINDEX", "TROPONINI" in the last 168 hours. BNP (last 3 results) No results for input(s): "PROBNP" in the last 8760 hours. HbA1C: No results for input(s): "HGBA1C" in the last 72 hours. CBG: No results for input(s): "GLUCAP" in the last  168 hours. Lipid Profile: No results for input(s): "CHOL", "HDL", "LDLCALC", "TRIG", "CHOLHDL", "LDLDIRECT" in the last 72 hours. Thyroid  Function Tests: No results for input(s): "TSH", "T4TOTAL", "FREET4", "T3FREE", "THYROIDAB" in the last 72 hours. Anemia Panel: No results for input(s): "VITAMINB12", "FOLATE", "FERRITIN", "TIBC", "IRON", "RETICCTPCT" in the last 72 hours. Sepsis Labs: No results for input(s): "PROCALCITON", "LATICACIDVEN" in the last 168 hours.  No results found for this or any previous visit (from the past 240 hours).   Radiology Studies: DG Abd Portable 1V-Small Bowel Obstruction Protocol-initial, 8 hr delay Result Date: 04/29/2024 CLINICAL DATA:  Small-bowel obstruction 8 hour delay. EXAM: PORTABLE ABDOMEN - 1 VIEW COMPARISON:  Abdominal series 04/29/2024. CT abdomen and pelvis 04/29/2024. FINDINGS: Oral contrast is seen throughout the colon. Dilated small bowel loops persist measuring up to 5.0 cm predominantly in the mid and upper abdomen. No suspicious calcifications are seen. Cholecystectomy clips are present. IMPRESSION: 1. Oral contrast is seen throughout the colon. 2. Persistent dilated small bowel loops measuring up to 5.0 cm likely related to partial small bowel obstruction. Electronically Signed   By: Tyron Gallon M.D.   On: 04/29/2024 22:36   CT ABDOMEN PELVIS W CONTRAST Result Date: 04/29/2024 CLINICAL DATA:  Abdominal pain EXAM: CT ABDOMEN AND PELVIS WITH CONTRAST TECHNIQUE: Multidetector CT imaging of the abdomen and pelvis was performed using the standard protocol following bolus administration of intravenous contrast. RADIATION DOSE REDUCTION: This exam was performed according to the departmental dose-optimization program which includes automated exposure control, adjustment of the mA and/or kV according to patient size and/or use of iterative reconstruction technique. CONTRAST:  75mL OMNIPAQUE  IOHEXOL  350 MG/ML SOLN COMPARISON:  05/19/2023.  Plain films today.  FINDINGS: Lower chest: No acute abnormality. Linear areas of scarring or atelectasis in the lung bases. Hepatobiliary: Prior cholecystectomy. Diffuse fatty infiltration of the liver. No focal hepatic abnormality. Pancreas: No focal abnormality or ductal dilatation. Spleen: Scattered calcifications.  Normal size. Adrenals/Urinary Tract: No adrenal abnormality. No focal renal abnormality. No stones or hydronephrosis. Urinary bladder is unremarkable. Stomach/Bowel: Normal appendix. Stomach mildly distended with fluid. Proximal small bowel and distal small bowel loops are decompressed. Mid small bowel loops are dilated with scattered air-fluid levels. Exact transition is not visualized, but  findings concerning for partial small bowel obstruction. Vascular/Lymphatic: Aortic atherosclerosis. No evidence of aneurysm or adenopathy. Reproductive: Prior hysterectomy.  No adnexal masses. Other: No free fluid or free air. Musculoskeletal: No acute bony abnormality. IMPRESSION: Dilated mid small bowel loops with scattered air-fluid levels. Proximal and distal small bowel decompressed. Findings concerning for partial small bowel obstruction. Exact transition not visualized. Aortic atherosclerosis. Electronically Signed   By: Janeece Mechanic M.D.   On: 04/29/2024 01:08   DG Abd Acute W/Chest Result Date: 04/29/2024 CLINICAL DATA:  Abdominal pain. EXAM: DG ABDOMEN ACUTE WITH 1 VIEW CHEST COMPARISON:  December 01, 2023 FINDINGS: Multiple air-filled loops of dilated small bowel are seen within the mid left abdomen (maximum small bowel diameter of approximately 4.6 cm). Air is also noted within normal caliber loops of large bowel. Radiopaque surgical clips are seen within the right upper quadrant. No radiopaque calculi or other significant radiographic abnormality is seen. Heart size and mediastinal contours are within normal limits. Mild atelectasis is seen within the bilateral lung bases. IMPRESSION: 1. Findings consistent with a  partial small bowel obstruction versus ileus. 2. Mild bibasilar atelectasis. Electronically Signed   By: Virgle Grime M.D.   On: 04/29/2024 00:33   Scheduled Meds:  atorvastatin   10 mg Oral Q2000   bisacodyl  10 mg Rectal Daily   FLUoxetine   10 mg Oral Q2000    HYDROmorphone  (DILAUDID ) injection  1 mg Intravenous Once   levothyroxine   150 mcg Oral Q2000   pantoprazole  (PROTONIX ) IV  40 mg Intravenous Q24H   polycarbophil  625 mg Oral BID   potassium chloride   40 mEq Oral Daily   Continuous Infusions:  lactated ringers       LOS: 0 days   Aura Leeds, DO [facility] Available via Epic secure chat 7am-7pm After these hours, please refer to coverage provider listed on [email or web address] 04/30/2024, 3:42 PM

## 2024-04-30 NOTE — Progress Notes (Signed)
 04/30/2024  Denise Case [id number] [birth year]  CARE TEAM: PCP: Benedetto Brady, MD  Outpatient Care Team: Patient Care Team: Benedetto Brady, MD as PCP - General (Family Medicine)  Inpatient Treatment Team: Treatment Team:  Eveline Hipps, DO [facility], Md, MD Prajapati, Flasher, RN Patrisha Boot, [place] Wilbert Hand, MD Tona Francis, RN Forestine Igo, NT Kathaleen Pale, RN Horton Maas, RN   Problem List:   Principal Problem:   SBO (small bowel obstruction) [facility])   * No surgery found *      Assessment [facility] Stay = 0 days)      Partial small bowel obstruction versus ileus rapidly improving.    Plan:  -Clear liquids.  Tolerates then try soft diet.  Hopefully can rapidly advance diet today.  -monitor electrolytes & replace as needed  Keep K>4, Mg>2, Phos>3  -VTE prophylaxis- SCDs.  Anticoagulation prophyllaxis SQ as appropriate  -mobilize as tolerated to help recovery.  Enlist therapies in moderate/high risk patients as appropriate  I updated the patient's status to the patient  Recommendations were made.  Questions were answered.  She expressed understanding & appreciation.  -Disposition: I think is premature to let her go home right now.  Lets advance her diet through the day and if does well hopefully home in the morning.  If worse, may need NG tube and go more conservatively.  Hopefully will continue to rapidly improve.       I reviewed nursing notes, ED provider notes, hospitalist notes, last 24 h vitals and pain scores, last 48 h intake and output, last 24 h labs and trends, and last 24 h imaging results.  I have reviewed this patient's available data, including medical history, events of note, test results, etc as part of my evaluation.   A significant portion of that time was spent in counseling. Care during the described time interval was provided by me.  This care required moderate level of medical decision making.   04/30/2024    Subjective: (Chief complaint)  " My butt is raw from all the diarrhea.  Can I go home?"  Looking at menu for clears.  Denies any nausea or vomiting.  Denies any abdominal pain.  Objective:  Vital signs:  Vitals:   04/30/24 0121 04/30/24 0456 04/30/24 0500 04/30/24 0838  BP: (!) 117/59 96/75  122/87  Pulse:  96    Resp:  19  20  Temp:  97.7 F (36.5 C)  98.3 F (36.8 C)  TempSrc:  Oral  Oral  SpO2:  95%  92%  Weight:   132.8 kg   Height:        Last BM Date : 04/30/24  Intake/Output   Yesterday:  05/30 0701 - 05/31 0700 In: 1447.2 [I.V.:1447.2] Out: -  This shift:  No intake/output data recorded.  Bowel function:  Flatus: YES  BM:  YES  Drain: (No drain)   Physical Exam:  General: Pt awake/alert in no acute distress.  Sitting in bed looking in the menu.  Calm. Eyes: PERRL, normal EOM.  Sclera clear.  No icterus Neuro: CN II-XII intact w/o focal sensory/motor deficits. Lymph: No head/neck/groin lymphadenopathy Psych:  No delerium/psychosis/paranoia.  Oriented x 4 HENT: Normocephalic, Mucus membranes moist.  No thrush Neck: Supple, No tracheal deviation.  No obvious thyromegaly Chest: No pain to chest wall compression.  Good respiratory excursion.  No audible wheezing CV:  Pulses intact.  Regular rhythm.  No major extremity edema MS: Normal AROM mjr joints.  No obvious deformity  Abdomen: Soft. Obese.   Nondistended.  Nontender.  No evidence of peritonitis.  No incarcerated hernias.  Ext:   No deformity.  No mjr edema.  No cyanosis Skin: No petechiae / purpurea.  No major sores.  Warm and dry    Results:   Cultures: No results found for this or any previous visit (from the past 720 hours).  Labs: Results for orders placed or performed during the hospital encounter of 04/28/24 (from the past 48 hours)  Comprehensive metabolic panel     Status: Abnormal   Collection Time: 04/28/24 11:59 PM  Result Value Ref Range   Sodium 139 135 -  145 mmol/L   Potassium 3.9 3.5 - 5.1 mmol/L    Comment: HEMOLYSIS AT THIS LEVEL MAY AFFECT RESULT   Chloride 99 98 - 111 mmol/L   CO2 22 22 - 32 mmol/L   Glucose, Bld 199 (H) 70 - 99 mg/dL    Comment: Glucose reference range applies only to samples taken after fasting for at least 8 hours.   BUN 11 8 - 23 mg/dL   Creatinine, Ser 1.61 0.44 - 1.00 mg/dL   Calcium  9.0 8.9 - 10.3 mg/dL   Total Protein 7.2 6.5 - 8.1 g/dL   Albumin 3.5 3.5 - 5.0 g/dL   AST 35 15 - 41 U/L    Comment: HEMOLYSIS AT THIS LEVEL MAY AFFECT RESULT   ALT 25 0 - 44 U/L    Comment: HEMOLYSIS AT THIS LEVEL MAY AFFECT RESULT   Alkaline Phosphatase 49 38 - 126 U/L   Total Bilirubin 1.0 0.0 - 1.2 mg/dL    Comment: HEMOLYSIS AT THIS LEVEL MAY AFFECT RESULT   GFR, Estimated >60 >60 mL/min    Comment: (NOTE) Calculated using the CKD-EPI Creatinine Equation (2021)    Anion gap 18 (H) 5 - 15    Comment: Performed at [facility], [address] [place], [place] [address]  Lipase, blood     Status: None   Collection Time: 04/28/24 11:59 PM  Result Value Ref Range   Lipase 26 11 - 51 U/L    Comment: Performed at [facility], [address] [place], [place] [address]  CBC with Diff     Status: Abnormal   Collection Time: 04/28/24 11:59 PM  Result Value Ref Range   WBC 16.4 (H) 4.0 - 10.5 K/uL   RBC 4.79 3.87 - 5.11 MIL/uL   Hemoglobin 14.7 12.0 - 15.0 g/dL   HCT 11.9 14.7 - 82.9 %   MCV 94.2 80.0 - 100.0 fL   MCH 30.7 26.0 - 34.0 pg   MCHC 32.6 30.0 - 36.0 g/dL   RDW 56.2 13.0 - 86.5 %   Platelets 303 150 - 400 K/uL   nRBC 0.0 0.0 - 0.2 %   Neutrophils Relative % 76 %   Neutro Abs 12.7 (H) 1.7 - 7.7 K/uL   Lymphocytes Relative 15 %   Lymphs Abs 2.4 0.7 - 4.0 K/uL   Monocytes Relative 7 %   Monocytes Absolute 1.1 (H) 0.1 - 1.0 K/uL   Eosinophils Relative 1 %   Eosinophils Absolute 0.1 0.0 - 0.5 K/uL   Basophils Relative 0 %   Basophils Absolute 0.1 0.0 - 0.1 K/uL   Immature Granulocytes 1 %    Abs Immature Granulocytes 0.09 (H) 0.00 - 0.07 K/uL    Comment: Performed at [facility], [address], [place], [place] [address]  CBC with Differential/Platelet     Status: Abnormal   Collection Time: 04/29/24  6:26 AM  Result Value Ref Range   WBC 13.4 (H) 4.0 - 10.5 K/uL   RBC 4.29 3.87 - 5.11 MIL/uL   Hemoglobin 13.2 12.0 - 15.0 g/dL   HCT 11.9 14.7 - 82.9 %   MCV 93.2 80.0 - 100.0 fL   MCH 30.8 26.0 - 34.0 pg   MCHC 33.0 30.0 - 36.0 g/dL   RDW 56.2 13.0 - 86.5 %   Platelets 275 150 - 400 K/uL   nRBC 0.0 0.0 - 0.2 %   Neutrophils Relative % 60 %   Neutro Abs 8.0 (H) 1.7 - 7.7 K/uL   Lymphocytes Relative 31 %   Lymphs Abs 4.1 (H) 0.7 - 4.0 K/uL   Monocytes Relative 8 %   Monocytes Absolute 1.0 0.1 - 1.0 K/uL   Eosinophils Relative 1 %   Eosinophils Absolute 0.1 0.0 - 0.5 K/uL   Basophils Relative 0 %   Basophils Absolute 0.0 0.0 - 0.1 K/uL   Immature Granulocytes 0 %   Abs Immature Granulocytes 0.05 0.00 - 0.07 K/uL    Comment: Performed at [facility], [address] [place], [place] [address]  Comprehensive metabolic panel with GFR     Status: Abnormal   Collection Time: 04/29/24  6:26 AM  Result Value Ref Range   Sodium 138 135 - 145 mmol/L   Potassium 3.7 3.5 - 5.1 mmol/L   Chloride 102 98 - 111 mmol/L   CO2 28 22 - 32 mmol/L   Glucose, Bld 155 (H) 70 - 99 mg/dL    Comment: Glucose reference range applies only to samples taken after fasting for at least 8 hours.   BUN 10 8 - 23 mg/dL   Creatinine, Ser 6.29 0.44 - 1.00 mg/dL   Calcium  8.9 8.9 - 10.3 mg/dL   Total Protein 6.6 6.5 - 8.1 g/dL   Albumin 3.0 (L) 3.5 - 5.0 g/dL   AST 25 15 - 41 U/L   ALT 22 0 - 44 U/L   Alkaline Phosphatase 45 38 - 126 U/L   Total Bilirubin 0.6 0.0 - 1.2 mg/dL   GFR, Estimated >52 >84 mL/min    Comment: (NOTE) Calculated using the CKD-EPI Creatinine Equation (2021)    Anion gap 8 5 - 15    Comment: Performed at [facility], [address] [place],  [place] [address]  Magnesium     Status: None   Collection Time: 04/29/24  6:26 AM  Result Value Ref Range   Magnesium 1.8 1.7 - 2.4 mg/dL    Comment: Performed at [facility], [address], [place], [place] [address]  CBC with Differential/Platelet     Status: None   Collection Time: 04/30/24  5:47 AM  Result Value Ref Range   WBC 9.2 4.0 - 10.5 K/uL   RBC 4.23 3.87 - 5.11 MIL/uL   Hemoglobin 13.0 12.0 - 15.0 g/dL   HCT 25.3 66.4 - 40.3 %   MCV 93.9 80.0 - 100.0 fL   MCH 30.7 26.0 - 34.0 pg   MCHC 32.7 30.0 - 36.0 g/dL   RDW 47.4 25.9 - 56.3 %   Platelets 241 150 - 400 K/uL   nRBC 0.0 0.0 - 0.2 %   Neutrophils Relative % 63 %   Neutro Abs 5.8 1.7 - 7.7 K/uL   Lymphocytes Relative 28 %   Lymphs Abs 2.6 0.7 -  4.0 K/uL   Monocytes Relative 7 %   Monocytes Absolute 0.7 0.1 - 1.0 K/uL   Eosinophils Relative 2 %   Eosinophils Absolute 0.1 0.0 - 0.5 K/uL   Basophils Relative 0 %   Basophils Absolute 0.0 0.0 - 0.1 K/uL   Immature Granulocytes 0 %   Abs Immature Granulocytes 0.04 0.00 - 0.07 K/uL    Comment: Performed at [facility], [address], [place], [place] [address]  Comprehensive metabolic panel with GFR     Status: Abnormal   Collection Time: 04/30/24  5:47 AM  Result Value Ref Range   Sodium 140 135 - 145 mmol/L   Potassium 3.2 (L) 3.5 - 5.1 mmol/L   Chloride 106 98 - 111 mmol/L   CO2 24 22 - 32 mmol/L   Glucose, Bld 177 (H) 70 - 99 mg/dL    Comment: Glucose reference range applies only to samples taken after fasting for at least 8 hours.   BUN 8 8 - 23 mg/dL   Creatinine, Ser 4.78 0.44 - 1.00 mg/dL   Calcium  8.6 (L) 8.9 - 10.3 mg/dL   Total Protein 6.6 6.5 - 8.1 g/dL   Albumin 3.1 (L) 3.5 - 5.0 g/dL   AST 23 15 - 41 U/L   ALT 21 0 - 44 U/L   Alkaline Phosphatase 41 38 - 126 U/L   Total Bilirubin 0.8 0.0 - 1.2 mg/dL   GFR, Estimated >29 >56 mL/min    Comment: (NOTE) Calculated using the CKD-EPI Creatinine Equation (2021)    Anion gap 10 5 - 15     Comment: Performed at [facility], [address], [place], [place] [address]  Magnesium     Status: None   Collection Time: 04/30/24  5:47 AM  Result Value Ref Range   Magnesium 1.9 1.7 - 2.4 mg/dL    Comment: Performed at [facility], [address], [place], [place] [address]  Phosphorus     Status: None   Collection Time: 04/30/24  5:47 AM  Result Value Ref Range   Phosphorus 3.2 2.5 - 4.6 mg/dL    Comment: Performed at [facility], [address] [place], [place] [address]    Imaging / Studies: DG Abd Portable 1V-Small Bowel Obstruction Protocol-initial, 8 hr delay Result Date: 04/29/2024 CLINICAL DATA:  Small-bowel obstruction 8 hour delay. EXAM: PORTABLE ABDOMEN - 1 VIEW COMPARISON:  Abdominal series 04/29/2024. CT abdomen and pelvis 04/29/2024. FINDINGS: Oral contrast is seen throughout the colon. Dilated small bowel loops persist measuring up to 5.0 cm predominantly in the mid and upper abdomen. No suspicious calcifications are seen. Cholecystectomy clips are present. IMPRESSION: 1. Oral contrast is seen throughout the colon. 2. Persistent dilated small bowel loops measuring up to 5.0 cm likely related to partial small bowel obstruction. Electronically Signed   By: Tyron Gallon M.D.   On: 04/29/2024 22:36   CT ABDOMEN PELVIS W CONTRAST Result Date: 04/29/2024 CLINICAL DATA:  Abdominal pain EXAM: CT ABDOMEN AND PELVIS WITH CONTRAST TECHNIQUE: Multidetector CT imaging of the abdomen and pelvis was performed using the standard protocol following bolus administration of intravenous contrast. RADIATION DOSE REDUCTION: This exam was performed according to the departmental dose-optimization program which includes automated exposure control, adjustment of the mA and/or kV according to patient size and/or use of iterative reconstruction technique. CONTRAST:  75mL OMNIPAQUE  IOHEXOL  350 MG/ML SOLN COMPARISON:  05/19/2023.  Plain films today. FINDINGS: Lower chest: No acute  abnormality.  Linear areas of scarring or atelectasis in the lung bases. Hepatobiliary: Prior cholecystectomy. Diffuse fatty infiltration of the liver. No focal hepatic abnormality. Pancreas: No focal abnormality or ductal dilatation. Spleen: Scattered calcifications.  Normal size. Adrenals/Urinary Tract: No adrenal abnormality. No focal renal abnormality. No stones or hydronephrosis. Urinary bladder is unremarkable. Stomach/Bowel: Normal appendix. Stomach mildly distended with fluid. Proximal small bowel and distal small bowel loops are decompressed. Mid small bowel loops are dilated with scattered air-fluid levels. Exact transition is not visualized, but findings concerning for partial small bowel obstruction. Vascular/Lymphatic: Aortic atherosclerosis. No evidence of aneurysm or adenopathy. Reproductive: Prior hysterectomy.  No adnexal masses. Other: No free fluid or free air. Musculoskeletal: No acute bony abnormality. IMPRESSION: Dilated mid small bowel loops with scattered air-fluid levels. Proximal and distal small bowel decompressed. Findings concerning for partial small bowel obstruction. Exact transition not visualized. Aortic atherosclerosis. Electronically Signed   By: Janeece Mechanic M.D.   On: 04/29/2024 01:08   DG Abd Acute W/Chest Result Date: 04/29/2024 CLINICAL DATA:  Abdominal pain. EXAM: DG ABDOMEN ACUTE WITH 1 VIEW CHEST COMPARISON:  December 01, 2023 FINDINGS: Multiple air-filled loops of dilated small bowel are seen within the mid left abdomen (maximum small bowel diameter of approximately 4.6 cm). Air is also noted within normal caliber loops of large bowel. Radiopaque surgical clips are seen within the right upper quadrant. No radiopaque calculi or other significant radiographic abnormality is seen. Heart size and mediastinal contours are within normal limits. Mild atelectasis is seen within the bilateral lung bases. IMPRESSION: 1. Findings consistent with a partial small bowel obstruction  versus ileus. 2. Mild bibasilar atelectasis. Electronically Signed   By: Virgle Grime M.D.   On: 04/29/2024 00:33    Medications / Allergies: per chart  Antibiotics: Anti-infectives (From admission, onward)    None         Note: Portions of this report may have been transcribed using voice recognition software. Every effort was made to ensure accuracy; however, inadvertent computerized transcription errors may be present.   Any transcriptional errors that result from this process are unintentional.    Eddye Goodie, MD, FACS, MASCRS Esophageal, Gastrointestinal & Colorectal Surgery Robotic and Minimally Invasive Surgery  [facility] [address] [place], [place] [address] [phone] Fax [phone] Main  CONTACT INFORMATION: Weekday (9AM-5PM): Call [facility] main office at [phone] Weeknight (5PM-9AM) or Weekend/Holiday: Check EPIC "Web Links" tab & use "AMION" (password " [email or web address]") for General Surgery [facility] coverage  Please, DO NOT use SecureChat  (it is not reliable communication to reach operating surgeons & will lead to a delay in care).   Epic staff messaging available for outptient concerns needing 1-2 business day response.      04/30/2024  8:43 AM

## 2024-05-01 ENCOUNTER — Inpatient Hospital Stay (HOSPITAL_COMMUNITY)

## 2024-05-01 DIAGNOSIS — K56609 Unspecified intestinal obstruction, unspecified as to partial versus complete obstruction: Secondary | ICD-10-CM | POA: Diagnosis not present

## 2024-05-01 LAB — CBC WITH DIFFERENTIAL/PLATELET
Abs Immature Granulocytes: 0.05 10*3/uL (ref 0.00–0.07)
Basophils Absolute: 0.1 10*3/uL (ref 0.0–0.1)
Basophils Relative: 1 %
Eosinophils Absolute: 0.1 10*3/uL (ref 0.0–0.5)
Eosinophils Relative: 1 %
HCT: 37.5 % (ref 36.0–46.0)
Hemoglobin: 12.4 g/dL (ref 12.0–15.0)
Immature Granulocytes: 1 %
Lymphocytes Relative: 37 %
Lymphs Abs: 3.7 10*3/uL (ref 0.7–4.0)
MCH: 30.9 pg (ref 26.0–34.0)
MCHC: 33.1 g/dL (ref 30.0–36.0)
MCV: 93.5 fL (ref 80.0–100.0)
Monocytes Absolute: 0.8 10*3/uL (ref 0.1–1.0)
Monocytes Relative: 8 %
Neutro Abs: 5.3 10*3/uL (ref 1.7–7.7)
Neutrophils Relative %: 52 %
Platelets: 236 10*3/uL (ref 150–400)
RBC: 4.01 MIL/uL (ref 3.87–5.11)
RDW: 13.8 % (ref 11.5–15.5)
WBC: 10 10*3/uL (ref 4.0–10.5)
nRBC: 0 % (ref 0.0–0.2)

## 2024-05-01 LAB — COMPREHENSIVE METABOLIC PANEL WITH GFR
ALT: 19 U/L (ref 0–44)
AST: 25 U/L (ref 15–41)
Albumin: 3 g/dL — ABNORMAL LOW (ref 3.5–5.0)
Alkaline Phosphatase: 39 U/L (ref 38–126)
Anion gap: 11 (ref 5–15)
BUN: 7 mg/dL — ABNORMAL LOW (ref 8–23)
CO2: 24 mmol/L (ref 22–32)
Calcium: 9 mg/dL (ref 8.9–10.3)
Chloride: 104 mmol/L (ref 98–111)
Creatinine, Ser: 0.58 mg/dL (ref 0.44–1.00)
GFR, Estimated: >60 mL/min (ref >60)
Glucose, Bld: 152 mg/dL — ABNORMAL HIGH (ref 70–99)
Potassium: 3.5 mmol/L (ref 3.5–5.1)
Sodium: 139 mmol/L (ref 135–145)
Total Bilirubin: 0.6 mg/dL (ref 0.0–1.2)
Total Protein: 6.4 g/dL — ABNORMAL LOW (ref 6.5–8.1)

## 2024-05-01 LAB — MAGNESIUM: Magnesium: 1.9 mg/dL (ref 1.7–2.4)

## 2024-05-01 LAB — PHOSPHORUS: Phosphorus: 3.8 mg/dL (ref 2.5–4.6)

## 2024-05-01 MED ORDER — CALCIUM POLYCARBOPHIL 625 MG PO TABS
625.0000 mg | ORAL_TABLET | Freq: Two times a day (BID) | ORAL | 0 refills | Status: AC
Start: 2024-05-01 — End: ?

## 2024-05-01 MED ORDER — POTASSIUM CHLORIDE CRYS ER 20 MEQ PO TBCR
40.0000 meq | EXTENDED_RELEASE_TABLET | Freq: Two times a day (BID) | ORAL | Status: DC
  Administered 2024-05-01: 40 meq via ORAL

## 2024-05-01 MED ORDER — SIMETHICONE 40 MG/0.6ML PO SUSP: 80.0000 mg | Freq: Four times a day (QID) | ORAL | 0 refills | Status: DC | PRN

## 2024-05-01 NOTE — Progress Notes (Signed)
 05/01/2024  Denise Case [id number] 1956/11/14  CARE TEAM: PCP: Benedetto Brady, MD  Outpatient Care Team: Patient Care Team: Benedetto Brady, MD as PCP - General (Family Medicine)  Inpatient Treatment Team: Treatment Team:  Eveline Hipps, DO [facility], Md, MD Wilbert Hand, MD Alba Huddle, LPN [place], Anitra Ket, RN Rodgers Clack, [place] Horton Maas, RN Kathaleen Pale, RN   Problem List:   Principal Problem:   SBO (small bowel obstruction) [facility])   * No surgery found *      Assessment [facility] Stay = 1 days)      Partial small bowel obstruction versus ileus rapidly improving.    Plan:  - Tolerating soft diet.  Okay to have regular solid diet.    - Resume home medications.  Having some anxiety and other issues but consolable.  - monitor electrolytes & replace as needed  Keep K>4, Mg>2, Phos>3  -VTE prophylaxis- SCDs.  Anticoagulation prophyllaxis SQ as appropriate  -mobilize as tolerated to help recovery.  Enlist therapies in moderate/high risk patients as appropriate  I updated the patient's status to the patient  Recommendations were made.  Questions were answered.  She expressed understanding & appreciation.  -Disposition: I think is safe for her to go home since she is having bowel movements and tolerating soft diet.  She is anxious to go home.  I think she would do better in home environment given her anxiety and other issues.  Husband agrees.  Patient sat up to give me a big hug after letting her express her concerns in noting that it is safe to go home from surgery standpoint.  I did caution I would defer to primary service but I am pretty certain they will agree.      I reviewed nursing notes, ED provider notes, hospitalist notes, last 24 h vitals and pain scores, last 48 h intake and output, last 24 h labs and trends, and last 24 h imaging results.  I have reviewed this patient's available data, including medical history, events of note, test  results, etc as part of my evaluation.   A significant portion of that time was spent in counseling. Care during the described time interval was provided by me.  This care required moderate level of medical decision making.  05/01/2024    Subjective: (Chief complaint)  "I am having panic attacks.  I do not think I am getting my Xanax  right"  Tolerating soft diet    Objective:  Vital signs:  Vitals:   04/30/24 1611 04/30/24 1958 04/30/24 2341 05/01/24 0531  BP: (!) 93/51 135/82 134/69 119/63  Pulse: 90 90 82 83  Resp: 17 18  16   Temp: 98.1 F (36.7 C) 98.2 F (36.8 C)  97.9 F (36.6 C)  TempSrc: Oral Oral  Oral  SpO2: 94% 97% 99% 97%  Weight:      Height:        Last BM Date : 04/30/24  Intake/Output   Yesterday:  05/31 0701 - 06/01 0700 In: 240 [P.O.:240] Out: -  This shift:  No intake/output data recorded.  Bowel function:  Flatus: YES  BM:  YES  Drain: (No drain)   Physical Exam:  General: Pt awake/alert in no acute distress.  Anxious but consolable. Eyes: PERRL, normal EOM.  Sclera clear.  No icterus Neuro: CN II-XII intact w/o focal sensory/motor deficits. Lymph: No head/neck/groin lymphadenopathy Psych:  No delerium/psychosis/paranoia.  Oriented x 4 HENT: Normocephalic, Mucus membranes moist.  No thrush  Neck: Supple, No tracheal deviation.  No obvious thyromegaly Chest: No pain to chest wall compression.  Good respiratory excursion.  No audible wheezing CV:  Pulses intact.  Regular rhythm.  No major extremity edema MS: Normal AROM mjr joints.  No obvious deformity  Abdomen: Soft. Obese.   Nondistended.  Nontender.  No evidence of peritonitis.  No incarcerated hernias.  Ext:   No deformity.  No mjr edema.  No cyanosis Skin: No petechiae / purpurea.  No major sores.  Warm and dry    Results:   Cultures: No results found for this or any previous visit (from the past 720 hours).  Labs: Results for orders placed or performed during the  hospital encounter of 04/28/24 (from the past 48 hours)  CBC with Differential/Platelet     Status: None   Collection Time: 04/30/24  5:47 AM  Result Value Ref Range   WBC 9.2 4.0 - 10.5 K/uL   RBC 4.23 3.87 - 5.11 MIL/uL   Hemoglobin 13.0 12.0 - 15.0 g/dL   HCT 16.1 09.6 - 04.5 %   MCV 93.9 80.0 - 100.0 fL   MCH 30.7 26.0 - 34.0 pg   MCHC 32.7 30.0 - 36.0 g/dL   RDW 40.9 81.1 - 91.4 %   Platelets 241 150 - 400 K/uL   nRBC 0.0 0.0 - 0.2 %   Neutrophils Relative % 63 %   Neutro Abs 5.8 1.7 - 7.7 K/uL   Lymphocytes Relative 28 %   Lymphs Abs 2.6 0.7 - 4.0 K/uL   Monocytes Relative 7 %   Monocytes Absolute 0.7 0.1 - 1.0 K/uL   Eosinophils Relative 2 %   Eosinophils Absolute 0.1 0.0 - 0.5 K/uL   Basophils Relative 0 %   Basophils Absolute 0.0 0.0 - 0.1 K/uL   Immature Granulocytes 0 %   Abs Immature Granulocytes 0.04 0.00 - 0.07 K/uL    Comment: Performed at [facility], [address] [place], [place] [address]  Comprehensive metabolic panel with GFR     Status: Abnormal   Collection Time: 04/30/24  5:47 AM  Result Value Ref Range   Sodium 140 135 - 145 mmol/L   Potassium 3.2 (L) 3.5 - 5.1 mmol/L   Chloride 106 98 - 111 mmol/L   CO2 24 22 - 32 mmol/L   Glucose, Bld 177 (H) 70 - 99 mg/dL    Comment: Glucose reference range applies only to samples taken after fasting for at least 8 hours.   BUN 8 8 - 23 mg/dL   Creatinine, Ser 6.21 0.44 - 1.00 mg/dL   Calcium  8.6 (L) 8.9 - 10.3 mg/dL   Total Protein 6.6 6.5 - 8.1 g/dL   Albumin 3.1 (L) 3.5 - 5.0 g/dL   AST 23 15 - 41 U/L   ALT 21 0 - 44 U/L   Alkaline Phosphatase 41 38 - 126 U/L   Total Bilirubin 0.8 0.0 - 1.2 mg/dL   GFR, Estimated >30 >86 mL/min    Comment: (NOTE) Calculated using the CKD-EPI Creatinine Equation (2021)    Anion gap 10 5 - 15    Comment: Performed at [facility], [address] [place], [place] [address]  Magnesium     Status: None   Collection Time: 04/30/24  5:47 AM  Result Value Ref  Range   Magnesium 1.9 1.7 - 2.4 mg/dL    Comment: Performed at [facility], [address] [place], [place] [address]  Phosphorus     Status: None   Collection Time: 04/30/24  5:47 AM  Result Value Ref Range   Phosphorus 3.2 2.5 - 4.6 mg/dL    Comment: Performed at [facility], [address], [place], [place] [address]  Magnesium     Status: None   Collection Time: 04/30/24  8:45 AM  Result Value Ref Range   Magnesium 1.9 1.7 - 2.4 mg/dL    Comment: Performed at [facility], [address], [place], [place] [address]  Phosphorus     Status: None   Collection Time: 04/30/24  8:45 AM  Result Value Ref Range   Phosphorus 3.3 2.5 - 4.6 mg/dL    Comment: Performed at [facility], [address], [place], [place] [address]  Prealbumin     Status: None   Collection Time: 04/30/24  8:45 AM  Result Value Ref Range   Prealbumin 19 18 - 38 mg/dL    Comment: Performed at [facility], [address] [place], [place] [address]  TSH     Status: None   Collection Time: 04/30/24  6:52 PM  Result Value Ref Range   TSH 1.582 0.350 - 4.500 uIU/mL    Comment: Performed by a 3rd Generation assay with a functional sensitivity of <=0.01 uIU/mL. Performed at [facility], [address], [place], [place] [address]   CBC with Differential/Platelet     Status: None   Collection Time: 05/01/24  6:47 AM  Result Value Ref Range   WBC 10.0 4.0 - 10.5 K/uL   RBC 4.01 3.87 - 5.11 MIL/uL   Hemoglobin 12.4 12.0 - 15.0 g/dL   HCT 56.4 33.2 - 95.1 %   MCV 93.5 80.0 - 100.0 fL   MCH 30.9 26.0 - 34.0 pg   MCHC 33.1 30.0 - 36.0 g/dL   RDW 88.4 16.6 - 06.3 %   Platelets 236 150 - 400 K/uL   nRBC 0.0 0.0 - 0.2 %   Neutrophils Relative % 52 %   Neutro Abs 5.3 1.7 - 7.7 K/uL   Lymphocytes Relative 37 %   Lymphs Abs 3.7 0.7 - 4.0 K/uL   Monocytes Relative 8 %   Monocytes Absolute 0.8 0.1 - 1.0 K/uL   Eosinophils Relative 1 %   Eosinophils Absolute 0.1 0.0 - 0.5 K/uL    Basophils Relative 1 %   Basophils Absolute 0.1 0.0 - 0.1 K/uL   Immature Granulocytes 1 %   Abs Immature Granulocytes 0.05 0.00 - 0.07 K/uL    Comment: Performed at [facility], [address], [place], [place] [address]    Imaging / Studies: DG Abd Portable 1V-Small Bowel Obstruction Protocol-initial, 8 hr delay Result Date: 04/29/2024 CLINICAL DATA:  Small-bowel obstruction 8 hour delay. EXAM: PORTABLE ABDOMEN - 1 VIEW COMPARISON:  Abdominal series 04/29/2024. CT abdomen and pelvis 04/29/2024. FINDINGS: Oral contrast is seen throughout the colon. Dilated small bowel loops persist measuring up to 5.0 cm predominantly in the mid and upper abdomen. No suspicious calcifications are seen. Cholecystectomy clips are present. IMPRESSION: 1. Oral contrast is seen throughout the colon. 2. Persistent dilated small bowel loops measuring up to 5.0 cm likely related to partial small bowel obstruction. Electronically Signed   By: Tyron Gallon M.D.   On: 04/29/2024 22:36    Medications / Allergies: per chart  Antibiotics: Anti-infectives (From admission, onward)    None  Note: Portions of this report may have been transcribed using voice recognition software. Every effort was made to ensure accuracy; however, inadvertent computerized transcription errors may be present.   Any transcriptional errors that result from this process are unintentional.    Eddye Goodie, MD, FACS, MASCRS Esophageal, Gastrointestinal & Colorectal Surgery Robotic and Minimally Invasive Surgery  [facility] [address] [place], [place] [address] [phone] Fax [phone] Main  CONTACT INFORMATION: Weekday (9AM-5PM): Call [facility] main office at [phone] Weeknight (5PM-9AM) or Weekend/Holiday: Check EPIC "Web Links" tab & use "AMION" (password " [email or web address]") for General Surgery [facility] coverage  Please, DO NOT use SecureChat  (it is  not reliable communication to reach operating surgeons & will lead to a delay in care).   Epic staff messaging available for outptient concerns needing 1-2 business day response.      05/01/2024  7:36 AM

## 2024-05-01 NOTE — Plan of Care (Signed)

## 2024-05-01 NOTE — Plan of Care (Signed)
  Problem: Education: Goal: Knowledge of General Education information will improve Description: Including pain rating scale, medication(s)/side effects and non-pharmacologic comfort measures Outcome: Progressing   Problem: Activity: Goal: Risk for activity intolerance will decrease Outcome: Progressing   Problem: Nutrition: Goal: Adequate nutrition will be maintained Outcome: Progressing   Problem: Elimination: Goal: Will not experience complications related to bowel motility Outcome: Progressing   Problem: Pain Managment: Goal: General experience of comfort will improve and/or be controlled Outcome: Progressing   Problem: Safety: Goal: Ability to remain free from injury will improve Outcome: Progressing

## 2024-05-01 NOTE — Discharge Summary (Signed)
 Physician Discharge Summary   Patient: Denise Case MRN: [id number] DOB: [birth year]  Admit date:     04/28/2024  Discharge date: 05/01/2024  Discharge Physician: Aura Leeds, DO   PCP: Benedetto Brady, MD   Recommendations at discharge:   Follow-up with PCP within 1 week repeat CBC, CMP, mag, Phos within 1 week Follow-up with general surgeon outpatient setting Follow-up with gastroenterology in outpatient setting  Discharge Diagnoses: Principal Problem:   SBO (small bowel obstruction) (HCC)  Resolved Problems:   * No resolved hospital problems. *  Hospital Course: Denise Case is a 68 y.o. female with medical history significant for but not limited to asthma and COPD, depression anxiety, GERD, fibromyalgia, fatty liver disease, history of panic attacks, hypertension, hyperlipidemia, hypothyroidism, peripheral neuropathy and diabetes mellitus type 2, and a history of recurrent small bowel obstructions who presents with abdominal discomfort and pain.  She continued to have some nausea but no vomiting and had an episode of diarrhea the day prior.  Further workup was done and revealed a partial small bowel obstruction and General Surgery has been consulted for further evaluation and recommendations.  This seems to be improving rapidly and neurosurg recommends continue clear liquid diets and then if she tolerates this advancing to SOFT this evening with mobilization.  She tolerated her soft diet and ambulated without issues.  Continues to have bowel movements.  She is medically stable for discharge at this time will need follow-up with PCP and general surgery in outpatient setting  Assessment and Plan:   Nausea and Abdominal Pain in the setting of partial Small Bowel Obstruction: Improving. Has had multiple SBO's in the past and has had multiple abdominal surgeries.  Had a pain 10 out of 10 and had nausea but no actual vomiting and had abdominal pain.  CT scan of the abdomen pelvis done and  showed Dilated mid small bowel loops with scattered air-fluid levels where Proximal and distal small bowel decompressed.  Concerning for partial small bowel obstruction with transition point not identified.   -General surgery consulted and recommended n.p.o. and maintenance IV fluids as well as placement of NG tube however she had a bowel movement prior to this.  Small bowel protocol is being done and she is clinically improving so general surgery has advance her diet to clear liquids.  They are recommending watching her and advancing her diet as tolerated to soft relatively quickly.  She tolerated soft without issues. - Since she improved we will discharge her at this morning -Continue with antiemetics with ondansetron  4 mg IV every 6 as needed for nausea; IV fluids LR 75 mL/h for 1 day now stopped -Pain control with fentanyl  25 to 50 mcg every 2 as needed for severe pain given that her blood pressure is on the lower side.   Leukocytosis: Improved. Likely in the Setting of Above. WBC went from 16.4 -> 13.4. -> 9.2 and at the time of discharge is 10.0 CTM for S/Sx of infection and repeat CBC in the AM  Hypokalemia: K+ is now 3.5. Replete w/ po Kcl 40 mEQ. CTM and Replete as Necessary. Repeat CMP in the AM   Hypotension: In the setting of Above. Hold Antihypertensives. Given 1 L LR Bolus. mIVF w/ LR @ 75 mL/hr now stopped. CTM BP per Protcol. Last BP reading was 122/87  Anxiety and Depression: Has a history of panic attacks.  Resume home Alprazolam  1 mg but change to TIDprn; D/C IV Lorazepam  1 mg Q8 as needed.  Resume her antidepressant with Fluoxetine  10 mg p.o. daily    Hypothyroidism: TSH was 1.582.  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 daily and tirzepatide.  Placed on sensitive NovoLog /scale insulin  every 4 given that she is n.p.o. continue to hold her home gabapentin  800 g p.o. 3 times daily  until able to safely swallow. CTM CBGs per Protocol.    Hyperlipidemia: Was her home atorvastatin  given that she is n.p.o. but will now resume given that diet is being advanced   Asthma and COPD: Holding her home theophylline  for now until able to tolerate oral medications.  Was going to resume home theophylline  but pharmacy indicated that they do not have her dosing.  Will hold for now and have her resume in the a.m. if she is discharged continue with as needed nebs   GERD/GI Prophylaxis: Will place on IV pantoprazole  40 mg daily and change back to po today given diet being advanced    Hypoalbuminemia: Patient's Albumin Level went from 3.5 -> 3.0 -> 3.1 is 3.0 at the time of discharge. CTM and Trend and Repeat CMP in the AM   Class III (Super Morbid) Obesity: Complicates overall prognosis and care. Estimated body mass index is 50.25 kg/m as calculated from the following:   Height as of this encounter: 5\' 4"  (1.626 m).   Weight as of this encounter: 132.8 kg. Weight Loss and Dietary Counseling given  Consultants: General Surgery Procedures performed: As delineated as above Disposition: Home Diet recommendation:  Discharge Diet Orders (From admission, onward)     Start     Ordered   05/01/24 0000  Diet - low sodium heart healthy        05/01/24 1202           Cardiac diet DISCHARGE MEDICATION: Allergies as of 05/01/2024       Reactions   Singulair [montelukast Sodium] Anaphylaxis, Swelling   Adoxa [doxycycline] Other (See Comments)   Strips lining off of top of tongue, like thrush   Hydrocodone-acetaminophen  Other (See Comments)   Migraines (high doses)   Ms Contin  [morphine ] Other (See Comments)   Migraines         Medication List     TAKE these medications    acetaminophen  500 MG tablet Commonly known as: TYLENOL  Take 1,000 mg by mouth every 6 (six) hours as needed for moderate pain.   albuterol  108 (90 Base) MCG/ACT inhaler Commonly known as: VENTOLIN   HFA Inhale 2 puffs into the lungs every 6 (six) hours as needed for wheezing or shortness of breath.   allopurinol  300 MG tablet Commonly known as: ZYLOPRIM  Take 300 mg by mouth daily at 2 PM.   ALPRAZolam  1 MG tablet Commonly known as: XANAX  Take 1 mg by mouth 3 (three) times daily.   atorvastatin  10 MG tablet Commonly known as: LIPITOR Take 10 mg by mouth See admin instructions. Take 1 tablet (10mg ) by mouth every evening at 2000, with food.   CINNAMON PO Take 1 tablet by mouth 3 (three) times daily.   cyclobenzaprine  10 MG tablet Commonly known as: FLEXERIL  Take 10 mg by mouth 3 (three) times daily as needed for muscle spasms.   ergocalciferol 1.25 MG (50000 UT) capsule Commonly known as: VITAMIN D2 Take 50,000 Units by mouth every Friday.   FISH OIL PO Take 1 capsule by mouth 2 (two) times daily.   FLUoxetine   10 MG capsule Commonly known as: PROZAC  Take 10 mg by mouth See admin instructions. Take 1 capsule (10mg ) by mouth once daily at 0600   furosemide 20 MG tablet Commonly known as: LASIX Take 50 mg by mouth See admin instructions. Take 2.5 tablets (50mg ) by mouth once daily at 0600.   gabapentin  800 MG tablet Commonly known as: NEURONTIN  Take 800 mg by mouth 3 (three) times daily.   glipiZIDE 5 MG tablet Commonly known as: GLUCOTROL Take 5 mg by mouth See admin instructions. Take 1 tablet (5mg ) by mouth once daily at 0600, with food.   levothyroxine  25 MCG tablet Commonly known as: SYNTHROID  Take 25 mcg by mouth See admin instructions. Take 1 tablet (25mcg) by mouth once daily before breakfast, at 0500 What changed: Another medication with the same name was changed. Make sure you understand how and when to take each.   levothyroxine  75 MCG tablet Commonly known as: Synthroid  Take 3 tablets (225 mcg total) by mouth daily before breakfast. What changed:  how much to take when to take this additional instructions   MINERAL ICE EX Apply 1 Application  topically daily as needed (pain).   ondansetron  8 MG disintegrating tablet Commonly known as: ZOFRAN -ODT Take 8 mg by mouth every 8 (eight) hours as needed for vomiting or nausea.   OVER THE COUNTER MEDICATION Take 1 tablet by mouth daily. OTC Healthy Habits StrictionD   OVER THE COUNTER MEDICATION Take 2 capsules by mouth daily. OTC "natural cleanser" supplement   OVER THE COUNTER MEDICATION Take 2 capsules by mouth at bedtime. OTC Good Night Sleep supplement   pantoprazole  40 MG tablet Commonly known as: PROTONIX  Take 40 mg by mouth See admin instructions. Take 1 tablet (40mg ) by mouth once daily at 0600, before food.   polycarbophil 625 MG tablet Commonly known as: FIBERCON Take 1 tablet (625 mg total) by mouth 2 (two) times daily.   simethicone 40 MG/0.6ML drops Commonly known as: MYLICON Take 1.2 mLs (80 mg total) by mouth 4 (four) times daily as needed for flatulence (PRN bloating, gassiness).   theophylline  300 MG 24 hr capsule Commonly known as: THEO-24  Take 300 mg by mouth See admin instructions. Take 1 tablet (300mg ) by mouth once daily at 0600.   tirzepatide 2.5 MG/0.5ML Pen Commonly known as: MOUNJARO Inject 2.5 mg into the skin every Sunday.   traMADol  50 MG tablet Commonly known as: ULTRAM  Take 50 mg by mouth in the morning, at noon, and at bedtime.   VITAMIN B-12 PO Take 1 tablet by mouth daily.   VITAMIN C PO Take 1 tablet by mouth 3 (three) times daily.        Discharge Exam: Filed Weights   04/29/24 0341 04/30/24 0500 05/01/24 0531  Weight: 129.6 kg 132.8 kg 132.6 kg   Vitals:   05/01/24 0531 05/01/24 0904  BP: 119/63 (!) 121/48  Pulse: 83 74  Resp: 16 16  Temp: 97.9 F (36.6 C) 98.2 F (36.8 C)  SpO2: 97% 98%   Examination: Physical Exam:  Constitutional: WN/WD super morbidly obese Caucasian female in no acute distress Respiratory: Diminished to auscultation bilaterally, no wheezing, rales, rhonchi or crackles. Normal  respiratory effort and patient is not tachypenic. No accessory muscle use.  Unlabored breathing Cardiovascular: RRR, no murmurs / rubs / gallops. S1 and S2 auscultated.  Minimal remedy edema Abdomen: Soft, non-tender, distended secondary to body habitus. Bowel sounds positive.  GU: Deferred. Musculoskeletal: No clubbing / cyanosis of digits/nails. No joint deformity  upper and lower extremities.  Skin: No rashes, lesions, ulcers on limited skin evaluation. No induration; Warm and dry.  Neurologic: CN 2-12 grossly intact with no focal deficits. Romberg sign and cerebellar reflexes not assessed.  Psychiatric: Normal judgment and insight. Alert and oriented x 3. Normal mood and appropriate affect.   Condition at discharge: stable  The results of significant diagnostics from this hospitalization (including imaging, microbiology, ancillary and laboratory) are listed below for reference.   Imaging Studies: DG Abd Portable 1V Result Date: 05/01/2024 CLINICAL DATA:  68 year old female with abdominal pain and discomfort. Partial small bowel obstruction suspected on recent CT Abdomen and Pelvis. EXAM: PORTABLE ABDOMEN - 1 VIEW COMPARISON:  04/29/2024 and earlier. FINDINGS: Portable AP supine views at 0655 hours. Bowel-gas pattern not significantly changed from the CT scout view on 04/30/2019, ongoing mid abdominal gas distended small bowel up to about 4 cm diameter. Volume of large bowel gas not significantly changed. Stable lung bases. Stable cholecystectomy clips. No pneumoperitoneum identified on these supine views. Stable visualized osseous structures. IMPRESSION: No significant bowel-gas pattern improvement since the CT on 04/29/2024. Electronically Signed   By: Marlise Simpers M.D.   On: 05/01/2024 09:18   DG Abd Portable 1V-Small Bowel Obstruction Protocol-initial, 8 hr delay Result Date: 04/29/2024 CLINICAL DATA:  Small-bowel obstruction 8 hour delay. EXAM: PORTABLE ABDOMEN - 1 VIEW COMPARISON:  Abdominal  series 04/29/2024. CT abdomen and pelvis 04/29/2024. FINDINGS: Oral contrast is seen throughout the colon. Dilated small bowel loops persist measuring up to 5.0 cm predominantly in the mid and upper abdomen. No suspicious calcifications are seen. Cholecystectomy clips are present. IMPRESSION: 1. Oral contrast is seen throughout the colon. 2. Persistent dilated small bowel loops measuring up to 5.0 cm likely related to partial small bowel obstruction. Electronically Signed   By: Tyron Gallon M.D.   On: 04/29/2024 22:36   CT ABDOMEN PELVIS W CONTRAST Result Date: 04/29/2024 CLINICAL DATA:  Abdominal pain EXAM: CT ABDOMEN AND PELVIS WITH CONTRAST TECHNIQUE: Multidetector CT imaging of the abdomen and pelvis was performed using the standard protocol following bolus administration of intravenous contrast. RADIATION DOSE REDUCTION: This exam was performed according to the departmental dose-optimization program which includes automated exposure control, adjustment of the mA and/or kV according to patient size and/or use of iterative reconstruction technique. CONTRAST:  75mL OMNIPAQUE  IOHEXOL  350 MG/ML SOLN COMPARISON:  05/19/2023.  Plain films today. FINDINGS: Lower chest: No acute abnormality. Linear areas of scarring or atelectasis in the lung bases. Hepatobiliary: Prior cholecystectomy. Diffuse fatty infiltration of the liver. No focal hepatic abnormality. Pancreas: No focal abnormality or ductal dilatation. Spleen: Scattered calcifications.  Normal size. Adrenals/Urinary Tract: No adrenal abnormality. No focal renal abnormality. No stones or hydronephrosis. Urinary bladder is unremarkable. Stomach/Bowel: Normal appendix. Stomach mildly distended with fluid. Proximal small bowel and distal small bowel loops are decompressed. Mid small bowel loops are dilated with scattered air-fluid levels. Exact transition is not visualized, but findings concerning for partial small bowel obstruction. Vascular/Lymphatic: Aortic  atherosclerosis. No evidence of aneurysm or adenopathy. Reproductive: Prior hysterectomy.  No adnexal masses. Other: No free fluid or free air. Musculoskeletal: No acute bony abnormality. IMPRESSION: Dilated mid small bowel loops with scattered air-fluid levels. Proximal and distal small bowel decompressed. Findings concerning for partial small bowel obstruction. Exact transition not visualized. Aortic atherosclerosis. Electronically Signed   By: Janeece Mechanic M.D.   On: 04/29/2024 01:08   DG Abd Acute W/Chest Result Date: 04/29/2024 CLINICAL DATA:  Abdominal pain. EXAM: DG ABDOMEN  ACUTE WITH 1 VIEW CHEST COMPARISON:  December 01, 2023 FINDINGS: Multiple air-filled loops of dilated small bowel are seen within the mid left abdomen (maximum small bowel diameter of approximately 4.6 cm). Air is also noted within normal caliber loops of large bowel. Radiopaque surgical clips are seen within the right upper quadrant. No radiopaque calculi or other significant radiographic abnormality is seen. Heart size and mediastinal contours are within normal limits. Mild atelectasis is seen within the bilateral lung bases. IMPRESSION: 1. Findings consistent with a partial small bowel obstruction versus ileus. 2. Mild bibasilar atelectasis. Electronically Signed   By: Virgle Grime M.D.   On: 04/29/2024 00:33   MM 3D SCREENING MAMMOGRAM BILATERAL BREAST Result Date: 04/20/2024 CLINICAL DATA:  Screening. EXAM: DIGITAL SCREENING BILATERAL MAMMOGRAM WITH TOMOSYNTHESIS AND CAD TECHNIQUE: Bilateral screening digital craniocaudal and mediolateral oblique mammograms were obtained. Bilateral screening digital breast tomosynthesis was performed. The images were evaluated with computer-aided detection. COMPARISON:  None available ACR Breast Density Category a: The breasts are almost entirely fatty. FINDINGS: There are no findings suspicious for malignancy. IMPRESSION: No mammographic evidence of malignancy. A result letter of this  screening mammogram will be mailed directly to the patient. RECOMMENDATION: Screening mammogram in one year. (Code:[id number]-B-01Y) BI-RADS CATEGORY  1: Negative. Electronically Signed   By: Sundra Engel M.D.   On: 04/20/2024 17:17    Microbiology: Results for orders placed or performed during the hospital encounter of 07/24/22  SARS Coronavirus 2 by RT PCR (hospital order, performed in [facility] hospital lab) *cepheid single result test* Anterior Nasal Swab     Status: Abnormal   Collection Time: 07/24/22  8:23 AM   Specimen: Anterior Nasal Swab  Result Value Ref Range Status   SARS Coronavirus 2 by RT PCR POSITIVE (A) NEGATIVE Final    Comment: (NOTE) SARS-CoV-2 target nucleic acids are DETECTED  SARS-CoV-2 RNA is generally detectable in upper respiratory specimens  during the acute phase of infection.  Positive results are indicative  of the presence of the identified virus, but do not rule out bacterial infection or co-infection with other pathogens not detected by the test.  Clinical correlation with patient history and  other diagnostic information is necessary to determine patient infection status.  The expected result is negative.  Fact Sheet for Patients:   RoadLapTop.co.za   Fact Sheet for Healthcare Providers:   http://kim-miller.com/    This test is not yet approved or cleared by the [place]  FDA and  has been authorized for detection and/or diagnosis of SARS-CoV-2 by FDA under an Emergency Use Authorization (EUA).  This EUA will remain in effect (meaning this test can be used) for the duration of  the COVID-19 declaration under Section 564(b)(1)  of the Act, 21 U.S.C. section 360-bbb-3(b)(1), unless the authorization is terminated or revoked sooner.   Performed at [facility], [address] [place], [place] [address]    Labs: CBC: Recent Labs  Lab 04/28/24 2359 04/29/24 0626 04/30/24 0547 05/01/24 0647   WBC 16.4* 13.4* 9.2 10.0  NEUTROABS 12.7* 8.0* 5.8 5.3  HGB 14.7 13.2 13.0 12.4  HCT 45.1 40.0 39.7 37.5  MCV 94.2 93.2 93.9 93.5  PLT 303 275 241 236   Basic Metabolic Panel: Recent Labs  Lab 04/28/24 2359 04/29/24 0626 04/30/24 0547 04/30/24 0845 05/01/24 0647  NA 139 138 140  --  139  K 3.9 3.7 3.2*  --  3.5  CL 99 102 106  --  104  CO2 22  28 24  --  24  GLUCOSE 199* 155* 177*  --  152*  BUN 11 10 8   --  7*  CREATININE 0.90 0.73 0.71  --  0.58  CALCIUM  9.0 8.9 8.6*  --  9.0  MG  --  1.8 1.9 1.9 1.9  PHOS  --   --  3.2 3.3 3.8   Liver Function Tests: Recent Labs  Lab 04/28/24 2359 04/29/24 0626 04/30/24 0547 05/01/24 0647  AST 35 25 23 25   ALT 25 22 21 19   ALKPHOS 49 45 41 39  BILITOT 1.0 0.6 0.8 0.6  PROT 7.2 6.6 6.6 6.4*  ALBUMIN 3.5 3.0* 3.1* 3.0*   CBG: No results for input(s): "GLUCAP" in the last 168 hours.  Discharge time spent: greater than 30 minutes.  Signed: Aura Leeds, DO [facility] 05/03/2024

## 2024-05-02 ENCOUNTER — Inpatient Hospital Stay: Admitting: Nurse Practitioner

## 2024-05-02 ENCOUNTER — Inpatient Hospital Stay

## 2024-05-17 ENCOUNTER — Inpatient Hospital Stay

## 2024-05-17 VITALS — BP 130/73 | HR 90 | Temp 98.1°F | Resp 19 | Ht 64.0 in | Wt 288.4 lb

## 2024-05-17 DIAGNOSIS — F17210 Nicotine dependence, cigarettes, uncomplicated: Secondary | ICD-10-CM | POA: Diagnosis not present

## 2024-05-17 DIAGNOSIS — Z862 Personal history of diseases of the blood and blood-forming organs and certain disorders involving the immune mechanism: Secondary | ICD-10-CM | POA: Diagnosis present

## 2024-05-17 NOTE — Assessment & Plan Note (Signed)
 Continue routine PCP follow-up.    Follow-up with us  as needed.

## 2024-05-17 NOTE — Progress Notes (Signed)
 [facility] CONSULT NOTE  Patient Care Team: Benedetto Brady, MD as PCP - General (Family Medicine)  ASSESSMENT & PLAN:  Denise Case is a 68 y.o.female with history of asthma and COPD, depression anxiety, GERD, fibromyalgia, fatty liver disease, history of panic attacks, hypertension, hyperlipidemia, hypothyroidism, peripheral neuropathy and diabetes mellitus type 2, recurrent small bowel obstructions being [facility].  Her lymphocytosis has resolved.  She was hospitalized in May for abdominal pain and at that time reported small bowel obstruction.  She had leukocytosis with mildly elevated neutrophils at the time.  Lymphocyte was 4.1.  Not really elevated.  Follow-up showed normalization of leukocytosis.  Normal hemoglobin and platelets.  Differentials were normal.  We also reviewed her previous hospitalization, and associated leukocytosis in the acute settings.  This has occurred several times and with resolution after acute process.  Discussed this is normal for acute reactive process.  No further workup is needed unless continuing rising of leukocytosis in the future. Assessment & Plan History of leukocytosis Continue routine PCP follow-up.    Follow-up with us  as needed.  All questions were answered. The patient knows to call the clinic with any problems, questions or concerns. No barriers to learning was detected.  Denise Ruddy, MD 6/17/20254:05 PM  CHIEF COMPLAINTS/PURPOSE OF CONSULTATION:  Leukocytosis  HISTORY OF PRESENTING ILLNESS:  Denise Case 68 y.o. female is here because of leukocytosis.  Chart reviewed in detail.  Patient has leukocytosis during episodes of hospitalization over the last few years.  Every time she gets admitted, WBC was slightly elevated.  Several occasions of mild neutrophilia.  Lymphocyte has been normal.  History showed resolution of leukocytosis after resolution.   From most recent hospitalization,  she has SBO.  Initial labs showed leukocytosis of 16.  Improvement over the next day to 13.  On discharge, WBC was 10.0.  No anemia or thrombocytopenia.   MEDICAL HISTORY:  Past Medical History:  Diagnosis Date   Anxiety    Asthma    Constipation due to pain medication    COPD (chronic obstructive pulmonary disease) (HCC)    Depression    Dermatitis    Diabetes mellitus without complication (HCC)    Diverticulitis    Fatty liver 12/02/2014   Fibromyalgia    GERD (gastroesophageal reflux disease)    History of kidney stones    History of panic attacks    HTN (hypertension) 06/14/2019   Hyperlipidemia    Hypothyroidism    Lumbar sprain    Migraine    Morbid obesity (HCC)    Neuropathy    feet and legs   Opiate dependence (HCC) 12/03/2014   Pneumonia 2015   Requires supplemental oxygen     PRN uses 1.5 Liters   SBO (small bowel obstruction) (HCC) 12/01/2014   Thyroid  disease    Tingling sensation    hands    SURGICAL HISTORY: Past Surgical History:  Procedure Laterality Date   ABDOMINAL HYSTERECTOMY     bladder tack     X 2   CHOLECYSTECTOMY N/A 05/25/2014   Procedure: LAPAROSCOPIC CHOLECYSTECTOMY WITH INTRAOPERATIVE CHOLANGIOGRAM;  Surgeon: Harlee Lichtenstein, MD;  Location: [facility];  Service: General;  Laterality: N/A;   COLONOSCOPY WITH PROPOFOL  N/A 04/05/2015   Procedure: COLONOSCOPY WITH PROPOFOL ;  Surgeon: Tami Falcon, MD;  Location: [facility];  Service: Endoscopy;  Laterality: N/A;   COLONOSCOPY WITH PROPOFOL  N/A 12/05/2022   Procedure: COLONOSCOPY WITH PROPOFOL ;  Surgeon: Alvis Jourdain, MD;  Location:  [facility];  Service: Gastroenterology;  Laterality: N/A;   POLYPECTOMY  12/05/2022   Procedure: POLYPECTOMY;  Surgeon: Alvis Jourdain, MD;  Location: [facility];  Service: Gastroenterology;;    SOCIAL HISTORY: Social History   Socioeconomic History   Marital status: Married    Spouse name: Not on file   Number of children: 3   Years of education: Not on file    Highest education level: Not on file  Occupational History   Not on file  Tobacco Use   Smoking status: Every Day    Current packs/day: 1.00    Average packs/day: 1 pack/day for 37.0 years (37.0 ttl pk-yrs)    Types: Cigarettes   Smokeless tobacco: Never  Vaping Use   Vaping status: Never Used  Substance and Sexual Activity   Alcohol use: No   Drug use: No   Sexual activity: Not on file  Other Topics Concern   Not on file  Social History Narrative   Denies caffeine use    Social Drivers of Health   Financial Resource Strain: Not on file  Food Insecurity: No Food Insecurity (04/29/2024)   Hunger Vital Sign    Worried About Running Out of Food in the Last Year: Never true    Ran Out of Food in the Last Year: Never true  Transportation Needs: No Transportation Needs (04/29/2024)   PRAPARE - Administrator, Civil Service (Medical): No    Lack of Transportation (Non-Medical): No  Physical Activity: Not on file  Stress: Not on file  Social Connections: Socially Isolated (04/29/2024)   Social Connection and Isolation Panel    Frequency of Communication with Friends and Family: Once a week    Frequency of Social Gatherings with Friends and Family: Never    Attends Religious Services: Never    Database administrator or Organizations: No    Attends Banker Meetings: Never    Marital Status: Married  Catering manager Violence: Not At Risk (04/29/2024)   Humiliation, Afraid, Rape, and Kick questionnaire    Fear of Current or Ex-Partner: No    Emotionally Abused: No    Physically Abused: No    Sexually Abused: No    FAMILY HISTORY: Family History  Problem Relation Age of Onset   Diabetes Mother     ALLERGIES:  is allergic to singulair [montelukast sodium], adoxa [doxycycline], hydrocodone-acetaminophen , and ms contin  [morphine ].  MEDICATIONS:  Current Outpatient Medications  Medication Sig Dispense Refill   acetaminophen  (TYLENOL ) 500 MG tablet Take  1,000 mg by mouth every 6 (six) hours as needed for moderate pain.     albuterol  (VENTOLIN  HFA) 108 (90 Base) MCG/ACT inhaler Inhale 2 puffs into the lungs every 6 (six) hours as needed for wheezing or shortness of breath. 8 g 1   allopurinol  (ZYLOPRIM ) 300 MG tablet Take 300 mg by mouth daily at 2 PM.     ALPRAZolam  (XANAX ) 1 MG tablet Take 1 mg by mouth 3 (three) times daily.     Ascorbic Acid (VITAMIN C PO) Take 1 tablet by mouth 3 (three) times daily.     atorvastatin  (LIPITOR) 10 MG tablet Take 10 mg by mouth See admin instructions. Take 1 tablet (10mg ) by mouth every evening at 2000, with food.     CINNAMON PO Take 1 tablet by mouth 3 (three) times daily.     Cyanocobalamin  (VITAMIN B-12 PO) Take 1 tablet by mouth daily.     cyclobenzaprine  (FLEXERIL ) 10 MG tablet Take  10 mg by mouth 3 (three) times daily as needed for muscle spasms.     ergocalciferol (VITAMIN D2) 50000 UNITS capsule Take 50,000 Units by mouth every Friday.     FLUoxetine  (PROZAC ) 10 MG capsule Take 10 mg by mouth See admin instructions. Take 1 capsule (10mg ) by mouth once daily at 0600     furosemide (LASIX) 20 MG tablet Take 50 mg by mouth See admin instructions. Take 2.5 tablets (50mg ) by mouth once daily at 0600.     gabapentin  (NEURONTIN ) 800 MG tablet Take 800 mg by mouth 3 (three) times daily.      glipiZIDE (GLUCOTROL) 5 MG tablet Take 5 mg by mouth See admin instructions. Take 1 tablet (5mg ) by mouth once daily at 0600, with food.     levothyroxine  (SYNTHROID ) 25 MCG tablet Take 25 mcg by mouth See admin instructions. Take 1 tablet (25mcg) by mouth once daily before breakfast, at 0500     levothyroxine  (SYNTHROID , LEVOTHROID) 75 MCG tablet Take 3 tablets (225 mcg total) by mouth daily before breakfast. (Patient taking differently: Take 150 mcg by mouth See admin instructions. Take 2 tablets ( ) by mouth daily before breakfast, at 0500.) 90 tablet 1   Menthol , Topical Analgesic, (MINERAL ICE EX) Apply 1  Application topically daily as needed (pain).     Omega-3 Fatty Acids (FISH OIL PO) Take 1 capsule by mouth 2 (two) times daily.     ondansetron  (ZOFRAN -ODT) 8 MG disintegrating tablet Take 8 mg by mouth every 8 (eight) hours as needed for vomiting or nausea.     OVER THE COUNTER MEDICATION Take 1 tablet by mouth daily. OTC Healthy Habits StrictionD     OVER THE COUNTER MEDICATION Take 2 capsules by mouth daily. OTC natural cleanser supplement     OVER THE COUNTER MEDICATION Take 2 capsules by mouth at bedtime. OTC Good Night Sleep supplement     pantoprazole  (PROTONIX ) 40 MG tablet Take 40 mg by mouth See admin instructions. Take 1 tablet (40mg ) by mouth once daily at 0600, before food.     polycarbophil (FIBERCON) 625 MG tablet Take 1 tablet (625 mg total) by mouth 2 (two) times daily. 60 tablet 0   simethicone  (MYLICON) 40 MG/0.6ML drops Take 1.2 mLs (80 mg total) by mouth 4 (four) times daily as needed for flatulence (PRN bloating, gassiness). 30 mL 0   theophylline  (THEO-24 ) 300 MG 24 hr capsule Take 300 mg by mouth See admin instructions. Take 1 tablet (300mg ) by mouth once daily at 0600.     tirzepatide (MOUNJARO) 2.5 MG/0.5ML Pen Inject 2.5 mg into the skin every Sunday.     traMADol  (ULTRAM ) 50 MG tablet Take 50 mg by mouth in the morning, at noon, and at bedtime.     No current facility-administered medications for this visit.    REVIEW OF SYSTEMS:   All relevant systems were reviewed with the patient and are negative.  PHYSICAL EXAMINATION:  Vitals:   05/17/24 1453  BP: 130/73  Pulse: 90  Resp: 19  Temp: 98.1 F (36.7 C)  SpO2: 95%   Filed Weights   05/17/24 1453  Weight: 288 lb 6.4 oz (130.8 kg)    GENERAL: alert, no distress and comfortable on wheelchair.  LABORATORY DATA:  I have reviewed the data as listed Lab Results  Component Value Date   WBC 10.0 05/01/2024   HGB 12.4 05/01/2024   HCT 37.5 05/01/2024   MCV 93.5 05/01/2024   PLT 236 05/01/2024

## 2024-05-31 ENCOUNTER — Other Ambulatory Visit (HOSPITAL_COMMUNITY): Payer: Self-pay | Admitting: Family Medicine

## 2024-05-31 DIAGNOSIS — R600 Localized edema: Secondary | ICD-10-CM

## 2024-06-23 ENCOUNTER — Ambulatory Visit (HOSPITAL_COMMUNITY)
Admission: RE | Admit: 2024-06-23 | Discharge: 2024-06-23 | Disposition: A | Discharge status: Home / Self Care | Source: Ambulatory Visit | Attending: Family Medicine | Admitting: Family Medicine

## 2024-06-23 DIAGNOSIS — R600 Localized edema: Secondary | ICD-10-CM | POA: Insufficient documentation

## 2024-06-23 DIAGNOSIS — J449 Chronic obstructive pulmonary disease, unspecified: Secondary | ICD-10-CM | POA: Insufficient documentation

## 2024-06-23 DIAGNOSIS — E785 Hyperlipidemia, unspecified: Secondary | ICD-10-CM | POA: Diagnosis not present

## 2024-06-23 DIAGNOSIS — E119 Type 2 diabetes mellitus without complications: Secondary | ICD-10-CM | POA: Diagnosis not present

## 2024-06-23 LAB — ECHOCARDIOGRAM COMPLETE
Area-P 1/2: 4.17 cm2
S' Lateral: 3.9 cm

## 2024-08-28 ENCOUNTER — Encounter: Payer: Self-pay | Admitting: Cardiology

## 2024-08-28 DIAGNOSIS — Z720 Tobacco use: Secondary | ICD-10-CM | POA: Insufficient documentation

## 2024-08-28 DIAGNOSIS — M7989 Other specified soft tissue disorders: Secondary | ICD-10-CM | POA: Insufficient documentation

## 2024-08-28 DIAGNOSIS — R072 Precordial pain: Secondary | ICD-10-CM | POA: Insufficient documentation

## 2024-08-28 NOTE — Progress Notes (Unsigned)
 Cardiology Office Note   Date:  08/28/2024   ID:  Denise Case, DOB 06-25-1956, MRN [id number]  PCP:  Leonel Cole, MD  Cardiologist:   None Referring:  ***  No chief complaint on file.     History of Present Illness: Denise Case is a 68 y.o. female who presents for with hyperlipidemia, NIDDM, morbid obesity, tobacco abuse.  She was seen for evaluation of exertional chest pain in 2020.   She underwent a two day stress protocol. Stress test showed abnormalities, as listed below. The exam was difficult but the perfusion was normal with artifact.    She did have an echo in July 2025 with normal EF without significant valvular abnormality.    This was ordered to evaluate edema.  ***   ***   *** Given patient's large body habitus, atient has not had any recurrence of chest pain. She continues to smoke 1 PPD. She is following up with her PCP soon regarding tobacco cessation. She is currently on fenofibrate for lipid lowering, with plans for lipid panel coming up soon through PCP.     She was seen at another cardiology practice in 2020.  ***    Past Medical History:  Diagnosis Date   Anxiety    Asthma    Constipation due to pain medication    COPD (chronic obstructive pulmonary disease) (HCC)    Depression    Dermatitis    Diabetes mellitus without complication (HCC)    Diverticulitis    Fatty liver 12/02/2014   Fibromyalgia    GERD (gastroesophageal reflux disease)    History of kidney stones    History of panic attacks    HTN (hypertension) 06/14/2019   Hyperlipidemia    Hypothyroidism    Lumbar sprain    Migraine    Morbid obesity (HCC)    Neuropathy    feet and legs   Opiate dependence (HCC) 12/03/2014   Pneumonia 2015   Requires supplemental oxygen     PRN uses 1.5 Liters   SBO (small bowel obstruction) (HCC) 12/01/2014   Thyroid  disease    Tingling sensation    hands    Past Surgical History:  Procedure Laterality Date   ABDOMINAL HYSTERECTOMY      bladder tack     X 2   CHOLECYSTECTOMY N/A 05/25/2014   Procedure: LAPAROSCOPIC CHOLECYSTECTOMY WITH INTRAOPERATIVE CHOLANGIOGRAM;  Surgeon: Krystal JINNY Russell, MD;  Location: [facility];  Service: General;  Laterality: N/A;   COLONOSCOPY WITH PROPOFOL  N/A 04/05/2015   Procedure: COLONOSCOPY WITH PROPOFOL ;  Surgeon: Renaye Sous, MD;  Location: [facility];  Service: Endoscopy;  Laterality: N/A;   COLONOSCOPY WITH PROPOFOL  N/A 12/05/2022   Procedure: COLONOSCOPY WITH PROPOFOL ;  Surgeon: Rollin Dover, MD;  Location: [facility];  Service: Gastroenterology;  Laterality: N/A;   POLYPECTOMY  12/05/2022   Procedure: POLYPECTOMY;  Surgeon: Rollin Dover, MD;  Location: [facility];  Service: Gastroenterology;;     Current Outpatient Medications  Medication Sig Dispense Refill   acetaminophen  (TYLENOL ) 500 MG tablet Take 1,000 mg by mouth every 6 (six) hours as needed for moderate pain.     albuterol  (VENTOLIN  HFA) 108 (90 Base) MCG/ACT inhaler Inhale 2 puffs into the lungs every 6 (six) hours as needed for wheezing or shortness of breath. 8 g 1   allopurinol  (ZYLOPRIM ) 300 MG tablet Take 300 mg by mouth daily at 2 PM.     ALPRAZolam  (XANAX ) 1 MG tablet Take 1 mg by mouth 3 (three)  times daily.     Ascorbic Acid (VITAMIN C PO) Take 1 tablet by mouth 3 (three) times daily.     atorvastatin  (LIPITOR) 10 MG tablet Take 10 mg by mouth See admin instructions. Take 1 tablet (10mg ) by mouth every evening at 2000, with food.     CINNAMON PO Take 1 tablet by mouth 3 (three) times daily.     Cyanocobalamin  (VITAMIN B-12 PO) Take 1 tablet by mouth daily.     cyclobenzaprine  (FLEXERIL ) 10 MG tablet Take 10 mg by mouth 3 (three) times daily as needed for muscle spasms.     ergocalciferol (VITAMIN D2) 50000 UNITS capsule Take 50,000 Units by mouth every Friday.     FLUoxetine  (PROZAC ) 10 MG capsule Take 10 mg by mouth See admin instructions. Take 1 capsule (10mg ) by mouth once daily at 0600     furosemide (LASIX) 20 MG  tablet Take 50 mg by mouth See admin instructions. Take 2.5 tablets (50mg ) by mouth once daily at 0600.     gabapentin  (NEURONTIN ) 800 MG tablet Take 800 mg by mouth 3 (three) times daily.      glipiZIDE (GLUCOTROL) 5 MG tablet Take 5 mg by mouth See admin instructions. Take 1 tablet (5mg ) by mouth once daily at 0600, with food.     levothyroxine  (SYNTHROID ) 25 MCG tablet Take 25 mcg by mouth See admin instructions. Take 1 tablet (25mcg) by mouth once daily before breakfast, at 0500     levothyroxine  (SYNTHROID , LEVOTHROID) 75 MCG tablet Take 3 tablets (225 mcg total) by mouth daily before breakfast. (Patient taking differently: Take 150 mcg by mouth See admin instructions. Take 2 tablets ( ) by mouth daily before breakfast, at 0500.) 90 tablet 1   Menthol , Topical Analgesic, (MINERAL ICE EX) Apply 1 Application topically daily as needed (pain).     Omega-3 Fatty Acids (FISH OIL PO) Take 1 capsule by mouth 2 (two) times daily.     ondansetron  (ZOFRAN -ODT) 8 MG disintegrating tablet Take 8 mg by mouth every 8 (eight) hours as needed for vomiting or nausea.     OVER THE COUNTER MEDICATION Take 1 tablet by mouth daily. OTC Healthy Habits StrictionD     OVER THE COUNTER MEDICATION Take 2 capsules by mouth daily. OTC natural cleanser supplement     OVER THE COUNTER MEDICATION Take 2 capsules by mouth at bedtime. OTC Good Night Sleep supplement     pantoprazole  (PROTONIX ) 40 MG tablet Take 40 mg by mouth See admin instructions. Take 1 tablet (40mg ) by mouth once daily at 0600, before food.     polycarbophil (FIBERCON) 625 MG tablet Take 1 tablet (625 mg total) by mouth 2 (two) times daily. 60 tablet 0   simethicone  (MYLICON) 40 MG/0.6ML drops Take 1.2 mLs (80 mg total) by mouth 4 (four) times daily as needed for flatulence (PRN bloating, gassiness). 30 mL 0   theophylline  (THEO-24 ) 300 MG 24 hr capsule Take 300 mg by mouth See admin instructions. Take 1 tablet (300mg ) by mouth once daily at 0600.      tirzepatide (MOUNJARO) 2.5 MG/0.5ML Pen Inject 2.5 mg into the skin every Sunday.     traMADol  (ULTRAM ) 50 MG tablet Take 50 mg by mouth in the morning, at noon, and at bedtime.     No current facility-administered medications for this visit.    Allergies:   Singulair [montelukast sodium], Adoxa [doxycycline], Hydrocodone-acetaminophen , and Ms contin  [morphine ]    Social History:  The patient  reports that she  has been smoking cigarettes. She has a 37 pack-year smoking history. She has never used smokeless tobacco. She reports that she does not drink alcohol and does not use drugs.   Family History:  The patient's ***family history includes Diabetes in her mother.    ROS:  Please see the history of present illness.   Otherwise, review of systems are positive for {NONE DEFAULTED:18576}.   All other systems are reviewed and negative.    PHYSICAL EXAM: VS:  There were no vitals taken for this visit. , BMI There is no height or weight on file to calculate BMI. GENERAL:  Well appearing HEENT:  Pupils equal round and reactive, fundi not visualized, oral mucosa unremarkable NECK:  No jugular venous distention, waveform within normal limits, carotid upstroke brisk and symmetric, no bruits, no thyromegaly LYMPHATICS:  No cervical, inguinal adenopathy LUNGS:  Clear to auscultation bilaterally BACK:  No CVA tenderness CHEST:  Unremarkable HEART:  PMI not displaced or sustained,S1 and S2 within normal limits, no S3, no S4, no clicks, no rubs, *** murmurs ABD:  Flat, positive bowel sounds normal in frequency in pitch, no bruits, no rebound, no guarding, no midline pulsatile mass, no hepatomegaly, no splenomegaly EXT:  2 plus pulses throughout, no edema, no cyanosis no clubbing SKIN:  No rashes no nodules NEURO:  Cranial nerves II through XII grossly intact, motor grossly intact throughout PSYCH:  Cognitively intact, oriented to person place and time    EKG:        Recent  Labs: 04/30/2024: TSH 1.582 05/01/2024: ALT 19; BUN 7; Creatinine, Ser 0.58; Hemoglobin 12.4; Magnesium 1.9; Platelets 236; Potassium 3.5; Sodium 139    Lipid Panel No results found for: CHOL, TRIG, HDL, CHOLHDL, VLDL, LDLCALC, LDLDIRECT    Wt Readings from Last 3 Encounters:  05/17/24 288 lb 6.4 oz (130.8 kg)  05/01/24 292 lb 5.3 oz (132.6 kg)  05/19/23 280 lb (127 kg)      Other studies Reviewed: Additional studies/ records that were reviewed today include: ***. Review of the above records demonstrates:  Please see elsewhere in the note.  ***   ASSESSMENT AND PLAN:  *** Precordial chest pain:  ***  Tobacco abuse:  ***   Edema:  ***   Current medicines are reviewed at length with the patient today.  The patient {ACTIONS; HAS/DOES NOT HAVE:19233} concerns regarding medicines.  The following changes have been made:  {PLAN; NO CHANGE:13088:s}  Labs/ tests ordered today include: *** No orders of the defined types were placed in this encounter.    Disposition:   FU with ***    Signed, Lynwood Schilling, MD  08/28/2024 9:52 PM     [facility]

## 2024-08-30 ENCOUNTER — Ambulatory Visit: Admitting: Cardiology

## 2024-08-31 ENCOUNTER — Ambulatory Visit: Attending: Internal Medicine | Admitting: Cardiology

## 2024-08-31 ENCOUNTER — Encounter: Payer: Self-pay | Admitting: Cardiology

## 2024-08-31 VITALS — BP 110/70 | HR 92 | Ht 66.0 in | Wt 284.0 lb

## 2024-08-31 DIAGNOSIS — M7989 Other specified soft tissue disorders: Secondary | ICD-10-CM | POA: Diagnosis not present

## 2024-08-31 DIAGNOSIS — R072 Precordial pain: Secondary | ICD-10-CM | POA: Insufficient documentation

## 2024-08-31 DIAGNOSIS — Z720 Tobacco use: Secondary | ICD-10-CM | POA: Insufficient documentation

## 2024-08-31 NOTE — Patient Instructions (Signed)

## 2024-09-29 ENCOUNTER — Ambulatory Visit (INDEPENDENT_AMBULATORY_CARE_PROVIDER_SITE_OTHER): Admitting: Student in an Organized Health Care Education/Training Program

## 2024-09-29 ENCOUNTER — Encounter: Payer: Self-pay | Admitting: Student in an Organized Health Care Education/Training Program

## 2024-09-29 VITALS — BP 120/62 | HR 93 | Temp 98.3°F | Ht 66.0 in | Wt 280.0 lb

## 2024-09-29 DIAGNOSIS — R0602 Shortness of breath: Secondary | ICD-10-CM

## 2024-09-29 DIAGNOSIS — J42 Unspecified chronic bronchitis: Secondary | ICD-10-CM | POA: Diagnosis not present

## 2024-09-29 DIAGNOSIS — I272 Pulmonary hypertension, unspecified: Secondary | ICD-10-CM

## 2024-09-29 DIAGNOSIS — G4733 Obstructive sleep apnea (adult) (pediatric): Secondary | ICD-10-CM

## 2024-09-29 DIAGNOSIS — F17200 Nicotine dependence, unspecified, uncomplicated: Secondary | ICD-10-CM

## 2024-09-29 MED ORDER — UMECLIDINIUM-VILANTEROL 62.5-25 MCG/ACT IN AEPB: 1.0000 | INHALATION_SPRAY | Freq: Every day | RESPIRATORY_TRACT | 12 refills | Status: DC

## 2024-09-29 NOTE — Progress Notes (Signed)
 Assessment & Plan:   #Shortness of breath #Chronic obstructive pulmonary disease #Possible Asthma/COPD Overlap    She has a history of childhood wheezing which suggests the possibility of asthma or a reactive airway disease. She also has a significant smoking history and COPD or COPD/Asthma overlap is on the differential. Symptoms are exacerbated by smoking, presenting with wheezing and dyspnea, especially on exertion. Albuterol  provides symptomatic relief.   Will initiate a workup with a PFT to assess for obstruction, COPD and/or asthma. We will also empirically treat with a LABA/LAMA inhaler, and she was instructed to stop her inhaler prior to PFT's. I will also obtain an allergen panel and CBC w/ differential to assess her t-helper cell type 2 response.  - umeclidinium-vilanterol (ANORO ELLIPTA) 62.5-25 MCG/ACT AEPB; Inhale 1 puff into the lungs daily.  Dispense: 30 each; Refill: 12 - Pulmonary Function Test; Future - Allergen Panel (27) + IGE - CBC with Differential/Platelet  #Possible Pulmonary hypertension    A CT scan for lung cancer screening revealed an enlarged pulmonary artery, suggesting possible pulmonary hypertension. The differential includes COPD, asthma, sleep apnea, or hypoxemia. She's had a normal echocardiogram in July without signs of elevated PA pressures, but this does not exclude pulmonary hypertension. Will obtain PFT's to assess DLCO for diffusion defect, a perfusion study to evaluate for CTEPH, and an auto-immune workup.  - NM Pulmonary Perfusion; Future - DG Chest 2 View; Future - Pulmonary Function Test; Future - Anti-CCP Ab, IgG + IgA (RDL) - MyoMarker 3 Plus Profile (RDL) - Rheumatoid factor - ANA 12 Plus Profile (RDL)  #Obstructive sleep apnea    Obstructive sleep apnea was diagnosed 4-5 years ago and is effectively managed with CPAP and nocturnal oxygen  supplementation. She continues to follow with sleep medicine for this.  #Nicotine dependence  (tobacco use disorder)    She has a 45-year smoking history, currently smoking approximately one pack per day. Previous cessation attempts lasted 3-5 years. She plans to quit smoking after resolving current family stressors. Discuss smoking cessation strategies and revisit in future appointments.    Return in about 2 months (around 11/29/2024).  [name], MD [facility]  I spent 60 minutes caring for this patient today, including preparing to see the patient, obtaining a medical history , reviewing a separately obtained history, performing a medically appropriate examination and/or evaluation, counseling and educating the patient/family/caregiver, ordering medications, tests, or procedures, documenting clinical information in the electronic health record, and independently interpreting results (not separately reported/billed) and communicating results to the patient/family/caregiver  End of visit medications:  Meds ordered this encounter  Medications   umeclidinium-vilanterol (ANORO ELLIPTA) 62.5-25 MCG/ACT AEPB    Sig: Inhale 1 puff into the lungs daily.    Dispense:  30 each    Refill:  12     Current Outpatient Medications:    acetaminophen  (TYLENOL ) 500 MG tablet, Take 1,000 mg by mouth every 6 (six) hours as needed for moderate pain., Disp: , Rfl:    albuterol  (VENTOLIN  HFA) 108 (90 Base) MCG/ACT inhaler, Inhale 2 puffs into the lungs every 6 (six) hours as needed for wheezing or shortness of breath., Disp: 8 g, Rfl: 1   allopurinol  (ZYLOPRIM ) 300 MG tablet, Take 300 mg by mouth daily at 2 PM., Disp: , Rfl:    ALPRAZolam  (XANAX ) 1 MG tablet, Take 1 mg by mouth 3 (three) times daily., Disp: , Rfl:    Ascorbic Acid (VITAMIN C PO), Take 1 tablet by mouth 3 (three)  times daily., Disp: , Rfl:    atorvastatin  (LIPITOR) 10 MG tablet, Take 10 mg by mouth See admin instructions. Take 1 tablet (10mg ) by mouth every evening at 2000, with food., Disp: , Rfl:     CINNAMON PO, Take 1 tablet by mouth 3 (three) times daily., Disp: , Rfl:    Cyanocobalamin  (VITAMIN B-12 PO), Take 1 tablet by mouth daily., Disp: , Rfl:    cyclobenzaprine  (FLEXERIL ) 10 MG tablet, Take 10 mg by mouth 3 (three) times daily as needed for muscle spasms., Disp: , Rfl:    ergocalciferol (VITAMIN D2) 50000 UNITS capsule, Take 50,000 Units by mouth every Friday., Disp: , Rfl:    FLUoxetine  (PROZAC ) 10 MG capsule, Take 10 mg by mouth See admin instructions. Take 1 capsule (10mg ) by mouth once daily at 0600, Disp: , Rfl:    furosemide (LASIX) 20 MG tablet, Take 50 mg by mouth See admin instructions. Take 2.5 tablets (50mg ) by mouth once daily at 0600., Disp: , Rfl:    gabapentin  (NEURONTIN ) 800 MG tablet, Take 800 mg by mouth 3 (three) times daily. , Disp: , Rfl:    glipiZIDE (GLUCOTROL) 5 MG tablet, Take 5 mg by mouth See admin instructions. Take 1 tablet (5mg ) by mouth once daily at 0600, with food., Disp: , Rfl:    levothyroxine  (SYNTHROID ) 25 MCG tablet, Take 25 mcg by mouth See admin instructions. Take 1 tablet (25mcg) by mouth once daily before breakfast, at 0500, Disp: , Rfl:    levothyroxine  (SYNTHROID , LEVOTHROID) 75 MCG tablet, Take 3 tablets (225 mcg total) by mouth daily before breakfast. (Patient taking differently: Take 150 mcg by mouth See admin instructions. Take 2 tablets ( ) by mouth daily before breakfast, at 0500.), Disp: 90 tablet, Rfl: 1   Menthol , Topical Analgesic, (MINERAL ICE EX), Apply 1 Application topically daily as needed (pain)., Disp: , Rfl:    Omega-3 Fatty Acids (FISH OIL PO), Take 1 capsule by mouth 2 (two) times daily., Disp: , Rfl:    ondansetron  (ZOFRAN -ODT) 8 MG disintegrating tablet, Take 8 mg by mouth every 8 (eight) hours as needed for vomiting or nausea., Disp: , Rfl:    OVER THE COUNTER MEDICATION, Take 1 tablet by mouth daily. OTC Healthy Habits StrictionD, Disp: , Rfl:    OVER THE COUNTER MEDICATION, Take 2 capsules by mouth daily. OTC  natural cleanser supplement, Disp: , Rfl:    OVER THE COUNTER MEDICATION, Take 2 capsules by mouth at bedtime. OTC Good Night Sleep supplement, Disp: , Rfl:    pantoprazole  (PROTONIX ) 40 MG tablet, Take 40 mg by mouth See admin instructions. Take 1 tablet (40mg ) by mouth once daily at 0600, before food., Disp: , Rfl:    polycarbophil (FIBERCON) 625 MG tablet, Take 1 tablet (625 mg total) by mouth 2 (two) times daily., Disp: 60 tablet, Rfl: 0   potassium chloride  (MICRO-K ) 10 MEQ CR capsule, Take 10 mEq by mouth 2 (two) times daily., Disp: , Rfl:    theophylline  (THEO-24 ) 300 MG 24 hr capsule, Take 300 mg by mouth See admin instructions. Take 1 tablet (300mg ) by mouth once daily at 0600., Disp: , Rfl:    tirzepatide (MOUNJARO) 2.5 MG/0.5ML Pen, Inject 2.5 mg into the skin every Sunday., Disp: , Rfl:    traMADol  (ULTRAM ) 50 MG tablet, Take 50 mg by mouth in the morning, at noon, and at bedtime., Disp: , Rfl:    umeclidinium-vilanterol (ANORO ELLIPTA) 62.5-25 MCG/ACT AEPB, Inhale 1 puff into the lungs daily., Disp:  30 each, Rfl: 12   Subjective:   PATIENT ID: Denise Case GENDER: female DOB: Oct 09, 1956, MRN: [id number]  Chief Complaint  Patient presents with   Consult    Has been diagnosed with COPD. DOE. Wheezing. Occasional cough.  Albuterol - PRN.    HPI  Discussed the use of AI scribe software for clinical note transcription with the patient, who gave verbal consent to proceed.  Daylan Boggess is a 68 year old female with COPD and sleep apnea who presents for evaluation of possibly elevated pulmonary artery pressure based on a recent chest CT.  She has a history of COPD and uses albuterol  inhalers as needed. She has smoked since age 74, currently about a pack a day, and has attempted to quit multiple times, successfully abstaining during pregnancies and for a total of three to five years. She experiences increased illness severity with colds, often leading to bronchitis and  occasionally pneumonia, and has been treated with antibiotics and prednisone , particularly in October.  She was diagnosed with sleep apnea approximately four to five years ago and uses a CPAP machine at night, which she reports helps her and makes her feel more rested upon waking. She is followed by a physician through [facility] for her sleep medicine needs.  She experiences wheezing and coughing, primarily in the morning and at dusk, which she has had all her life. She uses albuterol  inhalers as needed, which provide relief. No formal diagnosis of asthma, but she recalls wheezing since childhood.  She uses an oxygen  concentrator at home, with a long line allowing mobility within the house, and connects it to her CPAP machine at night. She experiences shortness of breath when hurried or exerting herself, such as walking through the house.  She has neuropathy affecting her feet and hands, leading to stability issues and requiring the use of a wheelchair and walker for mobility. She does not feel her feet and legs, which increases her fall risk. She suspects a possible broken foot due to swelling after an injury, but she is uncertain due to her lack of sensation.  She has a history of cataract surgery on both eyes three weeks ago, which has affected her vision temporarily.   She has a significant smoking history, with around 40 pack years of smoking. She continues to smoke.     Ancillary information including prior medications, full medical/surgical/family/social histories, and PFTs (when available) are listed below and have been reviewed.    Review of Systems  Constitutional:  Negative for chills, fever and weight loss.  Respiratory:  Positive for sputum production, shortness of breath and wheezing. Negative for hemoptysis.   Cardiovascular:  Negative for chest pain.     Objective:   Vitals:   09/29/24 1007  BP: 120/62  Pulse: 93  Temp: 98.3 F (36.8 C)  SpO2: 93%  Weight:  280 lb (127 kg)  Height: 5' 6 (1.676 m)   93% on 2 LPM  BMI Readings from Last 3 Encounters:  09/29/24 45.19 kg/m  08/31/24 45.84 kg/m  05/17/24 49.50 kg/m   Wt Readings from Last 3 Encounters:  09/29/24 280 lb (127 kg)  08/31/24 284 lb (128.8 kg)  05/17/24 288 lb 6.4 oz (130.8 kg)    Physical Exam Constitutional:      Appearance: Normal appearance. She is obese.  Cardiovascular:     Rate and Rhythm: Normal rate and regular rhythm.     Pulses: Normal pulses.     Heart sounds: Normal heart  sounds.  Pulmonary:     Effort: Pulmonary effort is normal.     Breath sounds: Normal breath sounds. No wheezing.  Neurological:     General: No focal deficit present.     Mental Status: She is alert and oriented to person, place, and time. Mental status is at baseline.       Ancillary Information    Past Medical History:  Diagnosis Date   Anxiety    Asthma    COPD (chronic obstructive pulmonary disease) (HCC)    Depression    Diabetes mellitus without complication (HCC)    Diverticulitis    Fatty liver 12/02/2014   Fibromyalgia    GERD (gastroesophageal reflux disease)    History of kidney stones    History of panic attacks    HTN (hypertension) 06/14/2019   Hyperlipidemia    Hypothyroidism    Lumbar sprain    Migraine    Morbid obesity (HCC)    Neuropathy    feet and legs   Opiate dependence (HCC) 12/03/2014   Pneumonia 2015   Requires supplemental oxygen     PRN uses 1.5 Liters   SBO (small bowel obstruction) (HCC) 12/01/2014   Thyroid  disease    Tingling sensation    hands     Family History  Problem Relation Age of Onset   Diabetes Mother    Diabetes Father      Past Surgical History:  Procedure Laterality Date   ABDOMINAL HYSTERECTOMY     bladder tack     X 2   CHOLECYSTECTOMY N/A 05/25/2014   Procedure: LAPAROSCOPIC CHOLECYSTECTOMY WITH INTRAOPERATIVE CHOLANGIOGRAM;  Surgeon: Krystal JINNY Russell, MD;  Location: [facility];  Service: General;   Laterality: N/A;   COLONOSCOPY WITH PROPOFOL  N/A 04/05/2015   Procedure: COLONOSCOPY WITH PROPOFOL ;  Surgeon: Renaye Sous, MD;  Location: [facility];  Service: Endoscopy;  Laterality: N/A;   COLONOSCOPY WITH PROPOFOL  N/A 12/05/2022   Procedure: COLONOSCOPY WITH PROPOFOL ;  Surgeon: Rollin Dover, MD;  Location: [facility];  Service: Gastroenterology;  Laterality: N/A;   POLYPECTOMY  12/05/2022   Procedure: POLYPECTOMY;  Surgeon: Rollin Dover, MD;  Location: [facility];  Service: Gastroenterology;;    Social History   Socioeconomic History   Marital status: Married    Spouse name: Not on file   Number of children: 3   Years of education: Not on file   Highest education level: Not on file  Occupational History   Not on file  Tobacco Use   Smoking status: Every Day    Current packs/day: 1.00    Average packs/day: 1 pack/day for 37.0 years (37.0 ttl pk-yrs)    Types: Cigarettes   Smokeless tobacco: Never   Tobacco comments:    Smoked 1 PPD - khj 09/29/2024        Started smoking at 69 years old    Smoked 1.5 PPD at her heaviest  Vaping Use   Vaping status: Never Used  Substance and Sexual Activity   Alcohol use: No   Drug use: No   Sexual activity: Not on file  Other Topics Concern   Not on file  Social History Narrative   Denies caffeine use .  Lives with husband.  Six children together.     Social Drivers of Corporate Investment Banker Strain: Not on file  Food Insecurity: No Food Insecurity (04/29/2024)   Hunger Vital Sign    Worried About Running Out of Food in the Last Year: Never true  Ran Out of Food in the Last Year: Never true  Transportation Needs: No Transportation Needs (04/29/2024)   PRAPARE - Administrator, Civil Service (Medical): No    Lack of Transportation (Non-Medical): No  Physical Activity: Not on file  Stress: Not on file  Social Connections: Socially Isolated (04/29/2024)   Social Connection and Isolation Panel    Frequency of  Communication with Friends and Family: Once a week    Frequency of Social Gatherings with Friends and Family: Never    Attends Religious Services: Never    Database Administrator or Organizations: No    Attends Banker Meetings: Never    Marital Status: Married  Catering Manager Violence: Not At Risk (04/29/2024)   Humiliation, Afraid, Rape, and Kick questionnaire    Fear of Current or Ex-Partner: No    Emotionally Abused: No    Physically Abused: No    Sexually Abused: No     Allergies  Allergen Reactions   Singulair [Montelukast Sodium] Anaphylaxis and Swelling   Adoxa [Doxycycline] Other (See Comments)    Strips lining off of top of tongue, like thrush   Hydrocodone-Acetaminophen  Other (See Comments)    Migraines (high doses)   Ms Contin  [Morphine ] Other (See Comments)    Migraines      CBC    Component Value Date/Time   WBC 10.0 05/01/2024 0647   RBC 4.01 05/01/2024 0647   HGB 12.4 05/01/2024 0647   HCT 37.5 05/01/2024 0647   PLT 236 05/01/2024 0647   MCV 93.5 05/01/2024 0647   MCH 30.9 05/01/2024 0647   MCHC 33.1 05/01/2024 0647   RDW 13.8 05/01/2024 0647   LYMPHSABS 3.7 05/01/2024 0647   MONOABS 0.8 05/01/2024 0647   EOSABS 0.1 05/01/2024 0647   BASOSABS 0.1 05/01/2024 9352    Pulmonary Functions Testing Results:     No data to display          Outpatient Medications Prior to Visit  Medication Sig Dispense Refill   acetaminophen  (TYLENOL ) 500 MG tablet Take 1,000 mg by mouth every 6 (six) hours as needed for moderate pain.     albuterol  (VENTOLIN  HFA) 108 (90 Base) MCG/ACT inhaler Inhale 2 puffs into the lungs every 6 (six) hours as needed for wheezing or shortness of breath. 8 g 1   allopurinol  (ZYLOPRIM ) 300 MG tablet Take 300 mg by mouth daily at 2 PM.     ALPRAZolam  (XANAX ) 1 MG tablet Take 1 mg by mouth 3 (three) times daily.     Ascorbic Acid (VITAMIN C PO) Take 1 tablet by mouth 3 (three) times daily.     atorvastatin  (LIPITOR) 10 MG  tablet Take 10 mg by mouth See admin instructions. Take 1 tablet (10mg ) by mouth every evening at 2000, with food.     CINNAMON PO Take 1 tablet by mouth 3 (three) times daily.     Cyanocobalamin  (VITAMIN B-12 PO) Take 1 tablet by mouth daily.     cyclobenzaprine  (FLEXERIL ) 10 MG tablet Take 10 mg by mouth 3 (three) times daily as needed for muscle spasms.     ergocalciferol (VITAMIN D2) 50000 UNITS capsule Take 50,000 Units by mouth every Friday.     FLUoxetine  (PROZAC ) 10 MG capsule Take 10 mg by mouth See admin instructions. Take 1 capsule (10mg ) by mouth once daily at 0600     furosemide (LASIX) 20 MG tablet Take 50 mg by mouth See admin instructions. Take 2.5 tablets (50mg ) by  mouth once daily at 0600.     gabapentin  (NEURONTIN ) 800 MG tablet Take 800 mg by mouth 3 (three) times daily.      glipiZIDE (GLUCOTROL) 5 MG tablet Take 5 mg by mouth See admin instructions. Take 1 tablet (5mg ) by mouth once daily at 0600, with food.     levothyroxine  (SYNTHROID ) 25 MCG tablet Take 25 mcg by mouth See admin instructions. Take 1 tablet (25mcg) by mouth once daily before breakfast, at 0500     levothyroxine  (SYNTHROID , LEVOTHROID) 75 MCG tablet Take 3 tablets (225 mcg total) by mouth daily before breakfast. (Patient taking differently: Take 150 mcg by mouth See admin instructions. Take 2 tablets ( ) by mouth daily before breakfast, at 0500.) 90 tablet 1   Menthol , Topical Analgesic, (MINERAL ICE EX) Apply 1 Application topically daily as needed (pain).     Omega-3 Fatty Acids (FISH OIL PO) Take 1 capsule by mouth 2 (two) times daily.     ondansetron  (ZOFRAN -ODT) 8 MG disintegrating tablet Take 8 mg by mouth every 8 (eight) hours as needed for vomiting or nausea.     OVER THE COUNTER MEDICATION Take 1 tablet by mouth daily. OTC Healthy Habits StrictionD     OVER THE COUNTER MEDICATION Take 2 capsules by mouth daily. OTC natural cleanser supplement     OVER THE COUNTER MEDICATION Take 2 capsules by  mouth at bedtime. OTC Good Night Sleep supplement     pantoprazole  (PROTONIX ) 40 MG tablet Take 40 mg by mouth See admin instructions. Take 1 tablet (40mg ) by mouth once daily at 0600, before food.     polycarbophil (FIBERCON) 625 MG tablet Take 1 tablet (625 mg total) by mouth 2 (two) times daily. 60 tablet 0   potassium chloride  (MICRO-K ) 10 MEQ CR capsule Take 10 mEq by mouth 2 (two) times daily.     theophylline  (THEO-24 ) 300 MG 24 hr capsule Take 300 mg by mouth See admin instructions. Take 1 tablet (300mg ) by mouth once daily at 0600.     tirzepatide (MOUNJARO) 2.5 MG/0.5ML Pen Inject 2.5 mg into the skin every Sunday.     traMADol  (ULTRAM ) 50 MG tablet Take 50 mg by mouth in the morning, at noon, and at bedtime.     No facility-administered medications prior to visit.

## 2024-09-29 NOTE — Patient Instructions (Addendum)
 VISIT SUMMARY: Today, you were seen for an evaluation of elevated pulmonary artery pressure. We discussed your history of COPD, sleep apnea, and smoking, and reviewed your current symptoms and treatments. We also addressed your concerns about shortness of breath, wheezing, and your smoking habits.  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 It is often worsened by smoking. We will start you on inhaler therapy right away and have ordered a pulmonary function test to better understand your condition. Please stop using your inhaler a few days before the test.  -PULMONARY HYPERTENSION: Pulmonary hypertension is high blood pressure in the arteries of your lungs. This can be caused by conditions like COPD, asthma, sleep apnea, or low oxygen  levels. We have ordered a perfusion scan and blood tests to further investigate this.  -OBSTRUCTIVE SLEEP APNEA: Obstructive sleep apnea is a condition where your breathing stops and starts during sleep. It is currently well-managed with your CPAP machine and oxygen  at night.  -NICOTINE DEPENDENCE (TOBACCO USE DISORDER): Nicotine dependence is an addiction to tobacco products. You have a long history of smoking and plan to quit after resolving current family stressors. We discussed smoking cessation strategies and will revisit this in future appointments.  INSTRUCTIONS: Please schedule a pulmonary function test and stop using your inhaler a few days before the test. Additionally, we have ordered a perfusion scan and blood tests to further evaluate your pulmonary hypertension. Continue using your CPAP machine and oxygen  at night as prescribed. We will discuss smoking cessation strategies in future appointments.  Today, I ordered blood work. You can get them draw at your preferred [facility] draw station. The nearest one  to [facility] is at nearby [facility] ([address] [place], [place], [place] [address]).   The [place]  Quitline: Call 1-800-QUIT-NOW (609-368-6488). The [place] Quitline is a free service for [place]  residents. Trained counselors are available from 8 am until 3 am, 365 days per year. Services are available in both English and Spanish.   Web Resources Free online support programs can help you track your progress and share experiences with others who are quitting. These are examples: www.becomeanex.org www.trytostop.org  www.smokefree.gov  www.https://www.vargas.com/.aspx  [facility]: offers comprehensive in-person tobacco treatment counseling at [facility] ([address], [place] [address]).  Open to everyone. Virtual appointments available. Free parking. Call [phone] to schedule an appointment or [phone] for general information.    Tobacco Cessation Medications  Nicotine Replacement Therapy (NRT)  Nicotine is the addictive part of tobacco smoke, but not the most dangerous part. There are 7000 other toxins in cigarettes, including carbon monoxide, that cause disease. People do not generally become addicted to medication. Common problems: People don't use enough medication or stop too early. Medications are safe and effective. Overdose is very uncommon. Use medications as long as needed (3 months minimum). Some combinations work better than single medications. Long acting medications like the NRT patch and bupropion provide continuous treatment for withdrawal symptoms.  PLUS  Short acting medications like the NRT gum, lozenge, inhaler, and nasal spray help people to cope with breakthrough cravings.  ? Nicotine Patch  Place patch on hairless skin on upper body, including arms and back. Each day: discard old patch, shower, apply new patch to a different site. Apply hydrocortisone cream to mildly red/irritated areas. Call  provider if rash develops. If patch causes sleep disturbance, remove patch  at bedtime and replace each morning after shower. Side effects may include: skin irritation, headache, insomnia, abnormal/vivid dreams.  ? Nicotine Gum  Chew gum slowly, park in cheek when peppery taste or tingling sensation begins (about 15-30 chews). When taste or tingling goes away, begin chewing again. Use until nicotine is gone (taste or tingle does not return, usually 30 minutes). Park in different areas of mouth. Nicotine is absorbed through the lining of the mouth. Use enough to control cravings, up to 24 pieces per day (if used alone). Avoid eating or drinking for 15 minutes before using and during use. Side effects may include: mouth/jaw soreness, hiccups, indigestion, hypersalivation.  If gum is not chewed correctly, additional side effects may include lightheadedness, nausea/vomiting, throat and mouth irritation.  ? Nicotine Lozenge  Allow to dissolve slowly in mouth (20-30 minutes). Do not chew or swallow. Nicotine release may cause a warm tingling sensation. Occasionally rotate to different areas of the mouth. Use enough to control cravings, up to 20 lozenges per day (if used alone). Avoid eating or drinking for 15 minutes before using and during use. Side effects may include: nausea, hiccups, cough, heartburn, headache, gas, insomnia.  ? Nicotine Nasal Spray Use 1 spray in each nostril (1 dose) and tilt head back for 1 minute. Do not sniff, swallow, or inhale through nose.  Use at least 8 doses (1 spray in each nostril) , up to 40 doses per day (if used alone). To reduce nasal irritation, spray on cotton swab and insert into nose. Side effects may include: nasal and/or throat irritation (hot, peppery, or burning sensation), nasal irritation, tearing, sneezing, cough, headache.  ? Nicotine Oral Inhaler (puffer) Inhale into the back of the throat or puff in short breaths. Do not inhale into the lungs.   Puff continuously for 20 minutes (about 80 puffs) until cartridge is empty. Change cartridge when it loses the "burning in throat" sensation (feels like air only). Open cartridges can be saved and used again within 24 hours. Use at least 6 and up to 16 cartridges per day (if used alone).  Avoid eating or drinking for 15 minutes before using and during use. Side effects may include: mouth and/or throat irritation, unpleasant taste, cough, nasal irritation, indigestion, hiccups, headache.  ? Chantix (varenicline) Days 1-3: Take one 0.5 mg white pill each morning for 3 days, one week before quit date. Days 4-7: Increase to one 0.5 mg white pill twice a day in morning and evening for 4 days.  On Day 8 (target quit date), increase to one 1 mg blue pill twice a day. Maintain this dose for a minimum of 3 months. Take with food and a full glass of water to reduce nausea. Be sure that the two doses are at least 8 hours apart, but try to take second dose early in the evening (i.e. 6 pm) to avoid sleep problems. Common side effects include: nausea, insomnia, headache, abnormal/vivid dreams. Tell your doctor if you have any history of psychiatric illness prior to starting Chantix.  STOP taking CHANTIX and contact a healthcare provider immediately if you experience agitation, hostility, depressed mood, changes in thoughts or behavior that are not typical for you, thinking about or attempting suicide, allergic or skin reactions including swelling, rash, redness, or peeling of the skin.  For patients who have heart disease: Smoking is a major risk factor for cardiovascular disease, and Chantix can help you quit smoking. Chantix may be associated with a small, increased risk of certain heart  events in patients who have heart disease. If you have any new or worsening symptoms of heart disease while taking Chantix, such as shortness of breath or trouble breathing, new or worsening chest pain, or new or worsening  pain in your legs when walking, call your doctor or get emergency medical help immediately.  ? Wellbutrin / Zyban (bupropion) Take one 150 mg pill each morning for 3 days, one week before target quit date. On Day 4, increase to one 150 mg pill twice a day, morning and evening.  Maintain this dose for a minimum of 3 months. Be sure that the two doses are at least 8 hours apart, but try to take second dose early in the evening (i.e. 6 pm) to avoid sleep problems. Avoid or minimize use of alcohol when taking this medication. Common side effects include: dry mouth, headache, insomnia, nausea, weight loss.  Risk of seizure is 12/998. STOP taking BUPROPION and contact a healthcare provider immediately if you experience agitation, hostility, depressed mood, changes in thoughts or behavior that are not typical for you, thinking about or attempting suicide, allergic or skin reactions including swelling, rash, redness, or peeling of the skin.                     Contains text generated by Abridge.                                 Contains text generated by Abridge.

## 2024-10-03 ENCOUNTER — Other Ambulatory Visit: Payer: Self-pay

## 2024-10-03 MED ORDER — STIOLTO RESPIMAT 2.5-2.5 MCG/ACT IN AERS: 2.0000 | INHALATION_SPRAY | Freq: Every day | RESPIRATORY_TRACT | 11 refills | Status: AC

## 2024-10-03 NOTE — Progress Notes (Signed)
 Received a fax form [facility] asking for an alternative to the Breo inhaler.   Per Dr. Isadora ok to send in [place].  I have sent in the [employer] and notified the patient's husband (DPR).    Nothing further needed

## 2024-10-05 ENCOUNTER — Ambulatory Visit

## 2024-10-06 ENCOUNTER — Other Ambulatory Visit

## 2024-10-13 ENCOUNTER — Ambulatory Visit

## 2024-10-13 ENCOUNTER — Encounter
Admission: RE | Admit: 2024-10-13 | Discharge: 2024-10-13 | Disposition: A | Discharge status: Home / Self Care | Source: Ambulatory Visit | Attending: Student in an Organized Health Care Education/Training Program | Admitting: Student in an Organized Health Care Education/Training Program

## 2024-10-13 DIAGNOSIS — R0602 Shortness of breath: Secondary | ICD-10-CM | POA: Diagnosis present

## 2024-10-13 DIAGNOSIS — J42 Unspecified chronic bronchitis: Secondary | ICD-10-CM | POA: Diagnosis present

## 2024-10-13 DIAGNOSIS — I272 Pulmonary hypertension, unspecified: Secondary | ICD-10-CM | POA: Insufficient documentation

## 2024-10-13 MED ORDER — TECHNETIUM TO 99M ALBUMIN AGGREGATED
4.2900 | Freq: Once | INTRAVENOUS | Status: AC | PRN
  Administered 2024-10-13: 4.29 via INTRAVENOUS

## 2024-10-18 ENCOUNTER — Ambulatory Visit (INDEPENDENT_AMBULATORY_CARE_PROVIDER_SITE_OTHER): Admitting: Podiatry

## 2024-10-18 ENCOUNTER — Ambulatory Visit (INDEPENDENT_AMBULATORY_CARE_PROVIDER_SITE_OTHER)

## 2024-10-18 VITALS — Ht 66.0 in | Wt 280.0 lb

## 2024-10-18 DIAGNOSIS — S99191A Other physeal fracture of right metatarsal, initial encounter for closed fracture: Secondary | ICD-10-CM | POA: Diagnosis not present

## 2024-10-18 DIAGNOSIS — S92501A Displaced unspecified fracture of right lesser toe(s), initial encounter for closed fracture: Secondary | ICD-10-CM

## 2024-10-18 DIAGNOSIS — M79671 Pain in right foot: Secondary | ICD-10-CM

## 2024-10-20 NOTE — Progress Notes (Signed)
 Subjective:  Patient ID: Denise Case, female    DOB: Jul 28, 1956,  MRN: [id number]  Chief Complaint  Patient presents with   Fracture    RM 1 Patient is here fracture of the right foot.    Discussed the use of AI scribe software for clinical note transcription with the patient, who gave verbal consent to proceed.  History of Present Illness Denise Case is a 68 year old female with diabetes and neuropathy who presents with right foot pain and swelling after kicking a crate. She is accompanied by her husband.  Approximately two weeks ago, she accidentally kicked a crate in the dark while using a walker, leading to right foot pain and swelling. The following morning, she noticed significant swelling in her foot. Since the injury, she has experienced pain primarily in the foot, extending up the leg. The foot is warm, and while redness is normal for her, the darkness of the discoloration is not. Swelling is also atypical for her.  She has neuropathy from diabetes and reports reduced sensation in her feet. Her diabetes management includes a recent switch from Ozempic to Mounjaro due to adverse effects from Ozempic. Her last A1c was 8.0, and she is scheduled for another check on December 2nd. She takes a small white pill three minutes after breakfast as part of her diabetes regimen.  She has a history of a previous foot injury that required stitches and immobilization. She also takes vitamin D 50,000 IU weekly. She does not drive and relies on her husband for transportation. She is concerned about potentially needing to travel to [place]  due to her brother's health issues.      Objective:    Physical Exam VASCULAR: DP and PT pulse palpable. Foot is warm and well-perfused. Capillary fill time is brisk. DERMATOLOGIC: Normal skin turgor, texture, and temperature. No open lesions, rashes, or ulcerations. NEUROLOGIC: Diffuse polyneuropathy. No paresthesias on examination. ORTHOPEDIC:  Pain to palpation on the fifth metatarsal EXTREMITIES: Edema and erythema around the right foot and ankle, extending from the foot to the mid leg.   No images are attached to the encounter.    Results LABS A1c: 8  RADIOLOGY Right foot X-ray: Mildly displaced zone 3 fracture of the fifth metatarsal, comminuted fracture of the fifth proximal phalanx (10/18/2024)   Assessment:   1. Right foot pain      Plan:  Patient was evaluated and treated and all questions answered.  Assessment and Plan Assessment & Plan Right Jones fracture and right fifth toe fracture Mildly displaced Jones fracture of the fifth metatarsal and comminuted fracture of the fifth proximal phalanx. Due to diffuse neuropathy and lack of symptoms, nonoperative treatment is preferred.  Delayed, nonunion or malunion may be asymptomatic and manageable. Noninvasive bone marrow stimulation is recommended to facilitate fracture healing due to multiple comorbidities and inability to undergo surgery. Healing is expected to take 2-3 months. - Remain in cam boot and be primarily nonweight bearing for the next several weeks. - Use ice packs over the fracture site to reduce swelling. - Elevate the foot when possible. - Will order new x-rays in six weeks to assess healing progress. - Will refer for bone growth stimulator to facilitate and accelerate nonoperative management and healing of this difficult fracture in a complex patient that is at high risk for perioperative complications with operative management.  Referral will be sent to [facility] - Avoid heat application to prevent increased swelling. - Use Tylenol  for pain management as needed.  Type 2 diabetes mellitus with diabetic polyneuropathy, poorly controlled Diabetes is poorly controlled with an A1c of 8.0. Neuropathy is significant, contributing to lack of pain sensation in the foot. Recent medication change from Ozempic to Mounjaro due to adverse effects. - Continue  current diabetes management plan, including Mounjaro and oral medication.  Morbid obesity Contributing to overall health status and complicating fracture management.  Chronic obstructive pulmonary disease (COPD) COPD is a comorbidity that influences treatment decisions, particularly regarding nonoperative management of fractures. - Consider low dose aspirin  twice a day if traveling, but avoid if using bone growth stimulator.      Return in about 6 weeks (around 11/29/2024) for fracture follow up (new xrays).

## 2024-10-21 LAB — MYOMARKER 3 PLUS PROFILE (RDL)

## 2024-10-21 LAB — ALLERGEN PANEL (27) + IGE
Alternaria Alternata IgE: <0.1 kU/L
Aspergillus Fumigatus IgE: <0.1 kU/L
Bahia Grass IgE: <0.1 kU/L
Bermuda Grass IgE: <0.1 kU/L
Cat Dander IgE: <0.1 kU/L
Cedar, Mountain IgE: <0.1 kU/L
Cladosporium Herbarum IgE: <0.1 kU/L
Cocklebur IgE: <0.1 kU/L
Cockroach, American IgE: <0.1 kU/L
Common Silver Birch IgE: <0.1 kU/L
D Farinae IgE: <0.1 kU/L
D Pteronyssinus IgE: <0.1 kU/L
Dog Dander IgE: <0.1 kU/L
Elm, American IgE: <0.1 kU/L
Hickory, White IgE: <0.1 kU/L
IgE (Immunoglobulin E), Serum: 37 [IU]/mL (ref 6–495)
Johnson Grass IgE: <0.1 kU/L
Kentucky Bluegrass IgE: <0.1 kU/L
Maple/Box Elder IgE: <0.1 kU/L
Mucor Racemosus IgE: <0.1 kU/L
Oak, White IgE: <0.1 kU/L
Penicillium Chrysogen IgE: <0.1 kU/L
Pigweed, Rough IgE: <0.1 kU/L
Plantain, English IgE: <0.1 kU/L
Ragweed, Short IgE: <0.1 kU/L
Setomelanomma Rostrat: <0.1 kU/L
Timothy Grass IgE: <0.1 kU/L
White Mulberry IgE: <0.1 kU/L

## 2024-10-21 LAB — CBC WITH DIFFERENTIAL/PLATELET
Basophils Absolute: 0.1 x10E3/uL (ref 0.0–0.2)
Basos: 1 %
EOS (ABSOLUTE): 0.2 x10E3/uL (ref 0.0–0.4)
Eos: 2 %
Hematocrit: 40.1 % (ref 34.0–46.6)
Hemoglobin: 13.1 g/dL (ref 11.1–15.9)
Immature Grans (Abs): 0 x10E3/uL (ref 0.0–0.1)
Immature Granulocytes: 0 %
Lymphocytes Absolute: 3.1 x10E3/uL (ref 0.7–3.1)
Lymphs: 28 %
MCH: 31.1 pg (ref 26.6–33.0)
MCHC: 32.7 g/dL (ref 31.5–35.7)
MCV: 95 fL (ref 79–97)
Monocytes Absolute: 0.7 x10E3/uL (ref 0.1–0.9)
Monocytes: 6 %
Neutrophils Absolute: 7.2 x10E3/uL — ABNORMAL HIGH (ref 1.4–7.0)
Neutrophils: 63 %
Platelets: 277 x10E3/uL (ref 150–450)
RBC: 4.21 x10E6/uL (ref 3.77–5.28)
RDW: 12.8 % (ref 11.7–15.4)
WBC: 11.4 x10E3/uL — ABNORMAL HIGH (ref 3.4–10.8)

## 2024-10-21 LAB — ANA 12 PLUS PROFILE, POSITIVE
Anti-Cardiolipin Ab, IgA (RDL): <12 U/mL (ref <12)
Anti-Cardiolipin Ab, IgG (RDL): <15 GPL U/mL (ref <15)
Anti-Cardiolipin Ab, IgM (RDL): <13 [MPL'U]/mL (ref <13)
Anti-Centromere Ab (RDL): <1:40 {titer}
Anti-Chromatin Ab, IgG (RDL): <20 U (ref <20)
Anti-La (SS-B) Ab (RDL): <20 U (ref <20)
Anti-Ro (SS-A) Ab (RDL): <20 U (ref <20)
Anti-Scl-70 Ab (RDL): <20 U (ref <20)
Anti-Sm Ab (RDL): <20 U (ref <20)
Anti-TPO Ab (RDL): <9 [IU]/mL (ref <9.0)
Anti-dsDNA Ab by Farr(RDL): <8 [IU]/mL (ref <8.0)
C3 Complement (RDL): 221 mg/dL — ABNORMAL HIGH (ref 90–180)
C4 Complement (RDL): 31 mg/dL (ref 10–40)
Homogeneous Pattern: 1:160 {titer} — ABNORMAL HIGH

## 2024-10-21 LAB — RHEUMATOID FACTOR: Rheumatoid fact SerPl-aCnc: 11.6 [IU]/mL (ref <14.0)

## 2024-10-21 LAB — ANA 12 PLUS PROFILE (RDL): Anti-Nuclear Ab by IFA (RDL): POSITIVE — AB

## 2024-10-21 LAB — ANTI-CCP AB, IGG + IGA (RDL): Anti-CCP Ab, IgG + IgA (RDL): <20 U (ref <20)

## 2024-11-02 ENCOUNTER — Ambulatory Visit: Payer: Self-pay | Admitting: Student in an Organized Health Care Education/Training Program

## 2024-11-29 ENCOUNTER — Ambulatory Visit (INDEPENDENT_AMBULATORY_CARE_PROVIDER_SITE_OTHER): Admitting: Podiatry

## 2024-11-29 ENCOUNTER — Ambulatory Visit (INDEPENDENT_AMBULATORY_CARE_PROVIDER_SITE_OTHER)

## 2024-11-29 VITALS — Ht 66.0 in | Wt 280.0 lb

## 2024-11-29 DIAGNOSIS — S99191A Other physeal fracture of right metatarsal, initial encounter for closed fracture: Secondary | ICD-10-CM | POA: Diagnosis not present

## 2024-11-29 DIAGNOSIS — S99191G Other physeal fracture of right metatarsal, subsequent encounter for fracture with delayed healing: Secondary | ICD-10-CM | POA: Diagnosis not present

## 2024-11-30 ENCOUNTER — Encounter: Payer: Self-pay | Admitting: Podiatry

## 2024-11-30 NOTE — Progress Notes (Signed)
"  °  Subjective:  Patient ID: Denise Case, female    DOB: Apr 22, 1956,  MRN: [id number]  Chief Complaint  Patient presents with   Fracture    Rm 4 Patient is here to f/u on fracture of the right foot. Patient states falling on 11/24/24, was not wearing boot at the time of incident. Patient states intermittent pain and swelling in the right foot. Pt states wearing tall walking boot for support.    Discussed the use of AI scribe software for clinical note transcription with the patient, who gave verbal consent to proceed.  History of Present Illness Denise Case is a 68 year old female with diabetes and neuropathy who presents with right foot pain and swelling after kicking a crate. She is accompanied by her husband.  She returns for follow-up, had a fall last week without the boot on      Objective:    Physical Exam VASCULAR: DP and PT pulse palpable. Foot is warm and well-perfused. Capillary fill time is brisk. DERMATOLOGIC: Normal skin turgor, texture, and temperature. No open lesions, rashes, or ulcerations. NEUROLOGIC: Diffuse polyneuropathy. No paresthesias on examination. ORTHOPEDIC: Pain to palpation on the fifth metatarsal EXTREMITIES: Edema and erythema around the right foot and ankle, extending from the foot to the mid leg.   No images are attached to the encounter.    Results LABS A1c: 8  RADIOLOGY Right foot X-ray: New radiographs taken today shows minimal interval bridging across fracture site, no significant major displacement compared to last x-rays   Assessment:   1. Closed fracture of base of fifth metatarsal bone of right foot at metaphyseal-diaphyseal junction with delayed healing, subsequent encounter      Plan:  Patient was evaluated and treated and all questions answered.  Assessment and Plan Assessment & Plan Right Jones fracture and right fifth toe fracture So far no much bridging across fracture site she should continue to be limited to  nonweightbearing in the cam walker boot on the right side.  Follow-up in 6 weeks for new x-rays.  We will resubmit for bone growth stimulator after this at the 90-day mark if she still has a nonunion.      Return in about 6 weeks (around 01/10/2025) for fracture follow up (new xrays).   "

## 2024-12-09 ENCOUNTER — Ambulatory Visit

## 2024-12-09 ENCOUNTER — Encounter: Payer: Self-pay | Admitting: Student in an Organized Health Care Education/Training Program

## 2024-12-09 ENCOUNTER — Ambulatory Visit: Admitting: Student in an Organized Health Care Education/Training Program

## 2024-12-09 VITALS — BP 116/62 | HR 96 | Temp 97.8°F | Ht 66.0 in | Wt 283.4 lb

## 2024-12-09 DIAGNOSIS — J449 Chronic obstructive pulmonary disease, unspecified: Secondary | ICD-10-CM | POA: Diagnosis not present

## 2024-12-09 DIAGNOSIS — Z6841 Body Mass Index (BMI) 40.0 and over, adult: Secondary | ICD-10-CM | POA: Diagnosis not present

## 2024-12-09 DIAGNOSIS — J4520 Mild intermittent asthma, uncomplicated: Secondary | ICD-10-CM

## 2024-12-09 DIAGNOSIS — G4733 Obstructive sleep apnea (adult) (pediatric): Secondary | ICD-10-CM

## 2024-12-09 DIAGNOSIS — E669 Obesity, unspecified: Secondary | ICD-10-CM | POA: Diagnosis not present

## 2024-12-09 DIAGNOSIS — F17210 Nicotine dependence, cigarettes, uncomplicated: Secondary | ICD-10-CM | POA: Diagnosis not present

## 2024-12-09 DIAGNOSIS — R0902 Hypoxemia: Secondary | ICD-10-CM

## 2024-12-09 DIAGNOSIS — R0602 Shortness of breath: Secondary | ICD-10-CM | POA: Diagnosis not present

## 2024-12-09 DIAGNOSIS — I272 Pulmonary hypertension, unspecified: Secondary | ICD-10-CM

## 2024-12-09 DIAGNOSIS — J984 Other disorders of lung: Secondary | ICD-10-CM | POA: Diagnosis not present

## 2024-12-09 DIAGNOSIS — J439 Emphysema, unspecified: Secondary | ICD-10-CM

## 2024-12-09 DIAGNOSIS — J42 Unspecified chronic bronchitis: Secondary | ICD-10-CM

## 2024-12-09 LAB — PULMONARY FUNCTION TEST
DL/VA % pred: 94 %
DL/VA: 3.9 ml/min/mmHg/L
DLCO unc % pred: 71 %
DLCO unc: 14.86 ml/min/mmHg
FEF 25-75 Post: 1.41 L/s
FEF 25-75 Pre: 1.29 L/s
FEF2575-%Change-Post: 8 %
FEF2575-%Pred-Post: 63 %
FEF2575-%Pred-Pre: 58 %
FEV1-%Change-Post: 0 %
FEV1-%Pred-Post: 65 %
FEV1-%Pred-Pre: 64 %
FEV1-Post: 1.69 L
FEV1-Pre: 1.67 L
FEV1FVC-%Change-Post: 3 %
FEV1FVC-%Pred-Pre: 99 %
FEV6-%Change-Post: -2 %
FEV6-%Pred-Post: 65 %
FEV6-%Pred-Pre: 67 %
FEV6-Post: 2.14 L
FEV6-Pre: 2.19 L
FEV6FVC-%Pred-Post: 104 %
FEV6FVC-%Pred-Pre: 104 %
FVC-%Change-Post: -2 %
FVC-%Pred-Post: 63 %
FVC-%Pred-Pre: 64 %
FVC-Post: 2.14 L
FVC-Pre: 2.19 L
Post FEV1/FVC ratio: 79 %
Post FEV6/FVC ratio: 100 %
Pre FEV1/FVC ratio: 76 %
Pre FEV6/FVC Ratio: 100 %
RV % pred: 103 %
RV: 2.28 L
TLC % pred: 80 %
TLC: 4.33 L

## 2024-12-09 NOTE — Progress Notes (Signed)
 Full PFT completed today ? ?

## 2024-12-09 NOTE — Patient Instructions (Addendum)
" °  VISIT SUMMARY: During your visit, we discussed your chronic obstructive pulmonary disease (COPD), obesity with restrictive lung disease, and episodes of nocturnal hypoxemia. Your breathing has improved since the last visit, and you are using your inhaler at night. You also experienced a recent episode of lightheadedness and low oxygen  saturation while asleep, which improved with increased oxygen  flow. Additionally, you are recovering from a recent injury to your toes and ankle.  YOUR PLAN: -CHRONIC OBSTRUCTIVE PULMONARY DISEASE: COPD is a chronic lung condition that causes obstructed airflow from the lungs. Your symptoms have improved with the use of the Stiolto inhaler. Continue using the Stiolto inhaler, two puffs once daily, preferably in the morning.  -OBESITY WITH RESTRICTIVE LUNG DISEASE: Obesity can cause a restrictive lung pattern, making it harder for your lungs to expand. To improve your lung function and reduce shortness of breath, it is recommended that you lose weight through dietary changes, specifically by reducing your intake of carbohydrates such as bread, pasta, potatoes, and rice. Additionally, increase your physical activity as tolerated.  -NOCTURNAL HYPOXEMIA: Nocturnal hypoxemia is a condition where your oxygen  levels drop while you are asleep. You experienced an episode where your oxygen  saturation dropped to 82%, which improved with increased oxygen  flow. Inform your sleep specialist about this episode, and we will consider re-evaluating your CPAP settings.  INSTRUCTIONS: Please follow up with your sleep specialist to discuss the recent episode of nocturnal hypoxemia and consider re-evaluating your CPAP settings. Continue using the Stiolto inhaler as directed and work on the recommended dietary changes and increased physical activity to aid in weight loss.                      Contains text generated by Abridge.                                  Contains text generated by Abridge.   "

## 2024-12-09 NOTE — Progress Notes (Signed)
 "  Assessment & Plan  #Preserved Ratio Impaired Spirometry #Chronic obstructive pulmonary disease #Mild Intermittent Asthma  #Mild Restriction - #Obesity  She has a history of childhood wheezing suggesting a reactive airway process. She also has a significant smoking history. Presents today for follow up after PFT's which showed FEV1/FVC of 0.79 (post BD), FEV1 of 65% predicted, and FVC of 64% predicted. This suggests obstruction with possible concomitant restriction or PRISM  (preserved ratio impaired spirometry). Lung volumes show TLC at the lower limit of normal at 80% predicted, with associated air trapping (RV/TLC increased). The combination of restriction and air trapping suggests that her vital capacity is impaired, and TLC is probably over estimated by a component of COPD related hyperinflation. I suspect this is secondary to central obesity related restriction. DLCO is mildly reduced at 71% predicted and consistent with history of smoking.  My impression is she has COPD with mild obstruction and restrictive pattern on PFTs, especially that her symptoms improved with LAMA/LABA use. It is not apparent to me that she has active asthma, but this will remain on the differential.  Finally, while there was concern for pulmonary hypertension based on size of PA on chest CT, she has only mild reduction in DLCO and there was no report of elevated PA or RV pressures on her echocardiogram. V/Q scan was low risk, and while auto-immune workup showed a mild elevation in ANA, it was non-specific without any specific antibodies being positive. I would suspect this is a false positive result. I would recommend repeating the echocardiogram in the future to re-evaluate PA pressures.  - Continue Stiolto inhaler, two puffs once daily, preferably in the morning.  #Obesity with restrictive lung disease  Restrictive lung pattern likely due to abdominal obesity. Weight loss recommended to improve lung expansion and  reduce shortness of breath.  - Advised weight loss through dietary changes, specifically reducing carbohydrate intake (bread, pasta, potatoes, rice). - Encouraged increased physical activity as tolerated.  #Nocturnal hypoxemia  Episodes of nocturnal hypoxemia with oxygen  saturation dropping to 82%. CPAP with supplemental oxygen  used. Recent titration study showed no changes in settings.  - Instructed to inform sleep specialist about recent hypoxemia episode. - Will consider re-evaluation of CPAP settings with sleep specialist. Patient might require BiPAP or might have a central sleep apnea component.   Return in about 6 months (around 06/08/2025).  [name], [name]  I spent 35 minutes caring for this patient today, including preparing to see the patient, obtaining a medical history , reviewing a separately obtained history, performing a medically appropriate examination and/or evaluation, counseling and educating the patient/family/caregiver, ordering medications, tests, or procedures, documenting clinical information in the electronic health record, and independently interpreting results (not separately reported/billed) and communicating results to the patient/family/caregiver  End of visit medications:  No orders of the defined types were placed in this encounter.   Current Medications[1]   Subjective:   PATIENT ID: Denise Case GENDER: female DOB: 1956-09-08, MRN: [id number]  Chief Complaint  Patient presents with   Shortness of Breath    PFT results. SOB. No wheezing. No cough.  Stiolto- 2 puffs daily, helps with her breathing. Albuterol - PRN    HPI  Discussed the use of AI scribe software for clinical note transcription with the patient, who gave verbal consent to proceed.  History of Present Illness  Denise Case is a 69 year old female who presents for follow-up of shortness of breath.  Initial Visit 09/29/2024:  She has a  history of COPD and uses albuterol  inhalers as needed. She has smoked since age 68, currently about a pack a day, and has attempted to quit multiple times, successfully abstaining during pregnancies and for a total of three to five years. She experiences increased illness severity with colds, often leading to bronchitis and occasionally pneumonia, and has been treated with antibiotics and prednisone , particularly in October.   She was diagnosed with sleep apnea approximately four to five years ago and uses a CPAP machine at night, which she reports helps her and makes her feel more rested upon waking. She is followed by a physician through [facility] for her sleep medicine needs.   She experiences wheezing and coughing, primarily in the morning and at dusk, which she has had all her life. She uses albuterol  inhalers as needed, which provide relief. No formal diagnosis of asthma, but she recalls wheezing since childhood.   She uses an oxygen  concentrator at home, with a long line allowing mobility within the house, and connects it to her CPAP machine at night. She experiences shortness of breath when hurried or exerting herself, such as walking through the house.   She has neuropathy affecting her feet and hands, leading to stability issues and requiring the use of a wheelchair and walker for mobility. She does not feel her feet and legs, which increases her fall risk. She suspects a possible broken foot due to swelling after an injury, but she is uncertain due to her lack of sensation.   She has a history of cataract surgery on both eyes three weeks ago, which has affected her vision temporarily.   Return Visit 12/09/2024:  Her breathing has returned to normal since the last visit. She uses her stiolto inhaler at night before bed, which she feels helps her shortness of breath.  Two nights ago, she experienced lightheadedness and a sensation of impending syncope, prompting her to lie down. Her  oxygen  saturation was measured at 82% while asleep, despite using a CPAP machine with supplemental oxygen . Her husband increased the oxygen  flow from three to four liters, which improved her oxygen  saturation to 89% within a few minutes.  She reports having had a repeat sleep study recently, and is followed by a sleep medicine doctor in [place].  Additionally, she reports a recent injury to her right foot when she kicked a crate, resulting in three broken toes and a dislocated bone near her ankle. She is being treated by orthopedic surgery.  She presents today to discuss her PFT's, as well as her VQ scan.   She has a significant smoking history, with at least 40 pack years. She continues to smoke.   Ancillary information including prior medications, full medical/surgical/family/social histories, and PFTs (when available) are listed below and have been reviewed.   Review of Systems  Constitutional:  Negative for chills and fever.  Respiratory:  Positive for shortness of breath. Negative for cough, hemoptysis and sputum production.   Cardiovascular:  Negative for chest pain.     Objective:   Vitals:   12/09/24 1050  BP: 116/62  Pulse: 96  Temp: 97.8 F (36.6 C)  SpO2: 96%  Weight: 283 lb 6.4 oz (128.5 kg)  Height: 5' 6 (1.676 m)   96% on RA BMI Readings from Last 3 Encounters:  12/09/24 45.74 kg/m  12/09/24 45.74 kg/m  11/29/24 45.19 kg/m   Wt Readings from Last 3 Encounters:  12/09/24 283 lb 6.4 oz (128.5 kg)  12/09/24  283 lb 6.4 oz (128.5 kg)  11/29/24 280 lb (127 kg)    Physical Exam Constitutional:      Appearance: Normal appearance. She is obese.  Cardiovascular:     Rate and Rhythm: Normal rate and regular rhythm.     Pulses: Normal pulses.     Heart sounds: Normal heart sounds.  Pulmonary:     Effort: Pulmonary effort is normal.     Breath sounds: Normal breath sounds.  Neurological:     General: No focal deficit present.     Mental Status: She is  alert and oriented to person, place, and time. Mental status is at baseline.       Ancillary Information    Past Medical History:  Diagnosis Date   Anxiety    Asthma    COPD (chronic obstructive pulmonary disease) (HCC)    Depression    Diabetes mellitus without complication (HCC)    Diverticulitis    Fatty liver 12/02/2014   Fibromyalgia    GERD (gastroesophageal reflux disease)    History of kidney stones    History of panic attacks    HTN (hypertension) 06/14/2019   Hyperlipidemia    Hypothyroidism    Lumbar sprain    Migraine    Morbid obesity (HCC)    Neuropathy    feet and legs   Opiate dependence (HCC) 12/03/2014   Pneumonia 2015   Requires supplemental oxygen     PRN uses 1.5 Liters   SBO (small bowel obstruction) (HCC) 12/01/2014   Thyroid  disease    Tingling sensation    hands     Family History  Problem Relation Age of Onset   Diabetes Mother    Diabetes Father      Past Surgical History:  Procedure Laterality Date   ABDOMINAL HYSTERECTOMY     bladder tack     X 2   CHOLECYSTECTOMY N/A 05/25/2014   Procedure: LAPAROSCOPIC CHOLECYSTECTOMY WITH INTRAOPERATIVE CHOLANGIOGRAM;  Surgeon: Krystal JINNY Russell, MD;  Location: [facility];  Service: General;  Laterality: N/A;   COLONOSCOPY WITH PROPOFOL  N/A 04/05/2015   Procedure: COLONOSCOPY WITH PROPOFOL ;  Surgeon: Renaye Sous, MD;  Location: [facility];  Service: Endoscopy;  Laterality: N/A;   COLONOSCOPY WITH PROPOFOL  N/A 12/05/2022   Procedure: COLONOSCOPY WITH PROPOFOL ;  Surgeon: Rollin Dover, MD;  Location: [facility];  Service: Gastroenterology;  Laterality: N/A;   POLYPECTOMY  12/05/2022   Procedure: POLYPECTOMY;  Surgeon: Rollin Dover, MD;  Location: [facility];  Service: Gastroenterology;;    Social History   Socioeconomic History   Marital status: Married    Spouse name: Not on file   Number of children: 3   Years of education: Not on file   Highest education level: Not on file  Occupational  History   Not on file  Tobacco Use   Smoking status: Every Day    Current packs/day: 1.00    Average packs/day: 1 pack/day for 37.0 years (37.0 ttl pk-yrs)    Types: Cigarettes   Smokeless tobacco: Never   Tobacco comments:    Smokes 1 PPD - khj 12/09/2024        Started smoking at 69 years old    Smoked 1.5 PPD at her heaviest  Vaping Use   Vaping status: Never Used  Substance and Sexual Activity   Alcohol use: No   Drug use: No   Sexual activity: Not on file  Other Topics Concern   Not on file  Social History Narrative   Denies  caffeine use .  Lives with husband.  Six children together.     Social Drivers of Health   Tobacco Use: High Risk (11/30/2024)   Patient History    Smoking Tobacco Use: Every Day    Smokeless Tobacco Use: Never    Passive Exposure: Not on file  Financial Resource Strain: Not on file  Food Insecurity: No Food Insecurity (04/29/2024)   Hunger Vital Sign    Worried About Running Out of Food in the Last Year: Never true    Ran Out of Food in the Last Year: Never true  Transportation Needs: No Transportation Needs (04/29/2024)   PRAPARE - Administrator, Civil Service (Medical): No    Lack of Transportation (Non-Medical): No  Physical Activity: Not on file  Stress: Not on file  Social Connections: Socially Isolated (04/29/2024)   Social Connection and Isolation Panel    Frequency of Communication with Friends and Family: Once a week    Frequency of Social Gatherings with Friends and Family: Never    Attends Religious Services: Never    Database Administrator or Organizations: No    Attends Banker Meetings: Never    Marital Status: Married  Catering Manager Violence: Not At Risk (04/29/2024)   Humiliation, Afraid, Rape, and Kick questionnaire    Fear of Current or Ex-Partner: No    Emotionally Abused: No    Physically Abused: No    Sexually Abused: No  Depression (PHQ2-9): Not on file  Alcohol Screen: Not on file   Housing: Low Risk (04/29/2024)   Housing Stability Vital Sign    Unable to Pay for Housing in the Last Year: No    Number of Times Moved in the Last Year: 0    Homeless in the Last Year: No  Utilities: Not At Risk (04/29/2024)   AHC Utilities    Threatened with loss of utilities: No  Health Literacy: Not on file     Allergies[2]   CBC    Component Value Date/Time   WBC 11.4 (H) 09/30/2024 1544   WBC 10.0 05/01/2024 0647   RBC 4.21 09/30/2024 1544   RBC 4.01 05/01/2024 0647   HGB 13.1 09/30/2024 1544   HCT 40.1 09/30/2024 1544   PLT 277 09/30/2024 1544   MCV 95 09/30/2024 1544   MCH 31.1 09/30/2024 1544   MCH 30.9 05/01/2024 0647   MCHC 32.7 09/30/2024 1544   MCHC 33.1 05/01/2024 0647   RDW 12.8 09/30/2024 1544   LYMPHSABS 3.1 09/30/2024 1544   MONOABS 0.8 05/01/2024 0647   EOSABS 0.2 09/30/2024 1544   BASOSABS 0.1 09/30/2024 1544    Pulmonary Functions Testing Results:    Latest Ref Rng & Units 12/09/2024   10:00 AM  PFT Results  FVC-Pre L 2.19   FVC-Predicted Pre % 64   FVC-Post L 2.14   FVC-Predicted Post % 63   Pre FEV1/FVC % % 76   Post FEV1/FCV % % 79   FEV1-Pre L 1.67   FEV1-Predicted Pre % 64   FEV1-Post L 1.69   DLCO uncorrected ml/min/mmHg 14.86   DLCO UNC% % 71   DLVA Predicted % 94   TLC L 4.33   TLC % Predicted % 80   RV % Predicted % 103     Outpatient Medications Prior to Visit  Medication Sig Dispense Refill   acetaminophen  (TYLENOL ) 500 MG tablet Take 1,000 mg by mouth every 6 (six) hours as needed for moderate pain.  albuterol  (VENTOLIN  HFA) 108 (90 Base) MCG/ACT inhaler Inhale 2 puffs into the lungs every 6 (six) hours as needed for wheezing or shortness of breath. 8 g 1   allopurinol  (ZYLOPRIM ) 300 MG tablet Take 300 mg by mouth daily at 2 PM.     ALPRAZolam  (XANAX ) 1 MG tablet Take 1 mg by mouth 3 (three) times daily.     Ascorbic Acid (VITAMIN C PO) Take 1 tablet by mouth 3 (three) times daily.     atorvastatin  (LIPITOR) 10 MG  tablet Take 10 mg by mouth See admin instructions. Take 1 tablet (10mg ) by mouth every evening at 2000, with food.     CINNAMON PO Take 1 tablet by mouth 3 (three) times daily.     clindamycin (CLEOCIN T) 1 % external solution Apply 1 Application topically as needed.     Cyanocobalamin  (VITAMIN B-12 PO) Take 1 tablet by mouth daily.     cyclobenzaprine  (FLEXERIL ) 10 MG tablet Take 10 mg by mouth 3 (three) times daily as needed for muscle spasms.     ergocalciferol (VITAMIN D2) 50000 UNITS capsule Take 50,000 Units by mouth every Friday.     FLUoxetine  (PROZAC ) 10 MG capsule Take 10 mg by mouth See admin instructions. Take 1 capsule (10mg ) by mouth once daily at 0600     furosemide (LASIX) 20 MG tablet Take 50 mg by mouth See admin instructions. Take 2.5 tablets (50mg ) by mouth once daily at 0600.     gabapentin  (NEURONTIN ) 800 MG tablet Take 800 mg by mouth 3 (three) times daily.      glipiZIDE (GLUCOTROL) 5 MG tablet Take 5 mg by mouth See admin instructions. Take 1 tablet (5mg ) by mouth once daily at 0600, with food.     JARDIANCE 10 MG TABS tablet Take 10 mg by mouth daily.     levothyroxine  (SYNTHROID ) 25 MCG tablet Take 25 mcg by mouth See admin instructions. Take 1 tablet (25mcg) by mouth once daily before breakfast, at 0500     levothyroxine  (SYNTHROID , LEVOTHROID) 75 MCG tablet Take 3 tablets (225 mcg total) by mouth daily before breakfast. (Patient taking differently: Take 150 mcg by mouth See admin instructions. Take 2 tablets ( ) by mouth daily before breakfast, at 0500.) 90 tablet 1   Menthol , Topical Analgesic, (MINERAL ICE EX) Apply 1 Application topically daily as needed (pain).     Omega-3 Fatty Acids (FISH OIL PO) Take 1 capsule by mouth 2 (two) times daily.     ondansetron  (ZOFRAN -ODT) 8 MG disintegrating tablet Take 8 mg by mouth every 8 (eight) hours as needed for vomiting or nausea.     OVER THE COUNTER MEDICATION Take 1 tablet by mouth daily. OTC Healthy Habits StrictionD      OVER THE COUNTER MEDICATION Take 2 capsules by mouth daily. OTC natural cleanser supplement     OVER THE COUNTER MEDICATION Take 2 capsules by mouth at bedtime. OTC Good Night Sleep supplement     pantoprazole  (PROTONIX ) 40 MG tablet Take 40 mg by mouth See admin instructions. Take 1 tablet (40mg ) by mouth once daily at 0600, before food.     polycarbophil (FIBERCON) 625 MG tablet Take 1 tablet (625 mg total) by mouth 2 (two) times daily. 60 tablet 0   potassium chloride  (MICRO-K ) 10 MEQ CR capsule Take 10 mEq by mouth 2 (two) times daily.     theophylline  (THEO-24 ) 300 MG 24 hr capsule Take 300 mg by mouth See admin instructions. Take 1 tablet (300mg )  by mouth once daily at 0600.     Tiotropium Bromide-Olodaterol (STIOLTO RESPIMAT ) 2.5-2.5 MCG/ACT AERS Inhale 2 puffs into the lungs daily. 8 g 11   tirzepatide (MOUNJARO) 2.5 MG/0.5ML Pen Inject 2.5 mg into the skin every Sunday.     traMADol  (ULTRAM ) 50 MG tablet Take 50 mg by mouth in the morning, at noon, and at bedtime.     No facility-administered medications prior to visit.      [1]  Current Outpatient Medications:    acetaminophen  (TYLENOL ) 500 MG tablet, Take 1,000 mg by mouth every 6 (six) hours as needed for moderate pain., Disp: , Rfl:    albuterol  (VENTOLIN  HFA) 108 (90 Base) MCG/ACT inhaler, Inhale 2 puffs into the lungs every 6 (six) hours as needed for wheezing or shortness of breath., Disp: 8 g, Rfl: 1   allopurinol  (ZYLOPRIM ) 300 MG tablet, Take 300 mg by mouth daily at 2 PM., Disp: , Rfl:    ALPRAZolam  (XANAX ) 1 MG tablet, Take 1 mg by mouth 3 (three) times daily., Disp: , Rfl:    Ascorbic Acid (VITAMIN C PO), Take 1 tablet by mouth 3 (three) times daily., Disp: , Rfl:    atorvastatin  (LIPITOR) 10 MG tablet, Take 10 mg by mouth See admin instructions. Take 1 tablet (10mg ) by mouth every evening at 2000, with food., Disp: , Rfl:    CINNAMON PO, Take 1 tablet by mouth 3 (three) times daily., Disp: , Rfl:    clindamycin  (CLEOCIN T) 1 % external solution, Apply 1 Application topically as needed., Disp: , Rfl:    Cyanocobalamin  (VITAMIN B-12 PO), Take 1 tablet by mouth daily., Disp: , Rfl:    cyclobenzaprine  (FLEXERIL ) 10 MG tablet, Take 10 mg by mouth 3 (three) times daily as needed for muscle spasms., Disp: , Rfl:    ergocalciferol (VITAMIN D2) 50000 UNITS capsule, Take 50,000 Units by mouth every Friday., Disp: , Rfl:    FLUoxetine  (PROZAC ) 10 MG capsule, Take 10 mg by mouth See admin instructions. Take 1 capsule (10mg ) by mouth once daily at 0600, Disp: , Rfl:    furosemide (LASIX) 20 MG tablet, Take 50 mg by mouth See admin instructions. Take 2.5 tablets (50mg ) by mouth once daily at 0600., Disp: , Rfl:    gabapentin  (NEURONTIN ) 800 MG tablet, Take 800 mg by mouth 3 (three) times daily. , Disp: , Rfl:    glipiZIDE (GLUCOTROL) 5 MG tablet, Take 5 mg by mouth See admin instructions. Take 1 tablet (5mg ) by mouth once daily at 0600, with food., Disp: , Rfl:    JARDIANCE 10 MG TABS tablet, Take 10 mg by mouth daily., Disp: , Rfl:    levothyroxine  (SYNTHROID ) 25 MCG tablet, Take 25 mcg by mouth See admin instructions. Take 1 tablet (25mcg) by mouth once daily before breakfast, at 0500, Disp: , Rfl:    levothyroxine  (SYNTHROID , LEVOTHROID) 75 MCG tablet, Take 3 tablets (225 mcg total) by mouth daily before breakfast. (Patient taking differently: Take 150 mcg by mouth See admin instructions. Take 2 tablets ( ) by mouth daily before breakfast, at 0500.), Disp: 90 tablet, Rfl: 1   Menthol , Topical Analgesic, (MINERAL ICE EX), Apply 1 Application topically daily as needed (pain)., Disp: , Rfl:    Omega-3 Fatty Acids (FISH OIL PO), Take 1 capsule by mouth 2 (two) times daily., Disp: , Rfl:    ondansetron  (ZOFRAN -ODT) 8 MG disintegrating tablet, Take 8 mg by mouth every 8 (eight) hours as needed for vomiting or nausea., Disp: ,  Rfl:    OVER THE COUNTER MEDICATION, Take 1 tablet by mouth daily. OTC Healthy Habits  StrictionD, Disp: , Rfl:    OVER THE COUNTER MEDICATION, Take 2 capsules by mouth daily. OTC natural cleanser supplement, Disp: , Rfl:    OVER THE COUNTER MEDICATION, Take 2 capsules by mouth at bedtime. OTC Good Night Sleep supplement, Disp: , Rfl:    pantoprazole  (PROTONIX ) 40 MG tablet, Take 40 mg by mouth See admin instructions. Take 1 tablet (40mg ) by mouth once daily at 0600, before food., Disp: , Rfl:    polycarbophil (FIBERCON) 625 MG tablet, Take 1 tablet (625 mg total) by mouth 2 (two) times daily., Disp: 60 tablet, Rfl: 0   potassium chloride  (MICRO-K ) 10 MEQ CR capsule, Take 10 mEq by mouth 2 (two) times daily., Disp: , Rfl:    theophylline  (THEO-24 ) 300 MG 24 hr capsule, Take 300 mg by mouth See admin instructions. Take 1 tablet (300mg ) by mouth once daily at 0600., Disp: , Rfl:    Tiotropium Bromide-Olodaterol (STIOLTO RESPIMAT ) 2.5-2.5 MCG/ACT AERS, Inhale 2 puffs into the lungs daily., Disp: 8 g, Rfl: 11   tirzepatide (MOUNJARO) 2.5 MG/0.5ML Pen, Inject 2.5 mg into the skin every Sunday., Disp: , Rfl:    traMADol  (ULTRAM ) 50 MG tablet, Take 50 mg by mouth in the morning, at noon, and at bedtime., Disp: , Rfl:  [2]  Allergies Allergen Reactions   Singulair [Montelukast Sodium] Anaphylaxis and Swelling   Adoxa [Doxycycline] Other (See Comments)    Strips lining off of top of tongue, like thrush   Hydrocodone-Acetaminophen  Other (See Comments)    Migraines (high doses)   Ms Contin  [Morphine ] Other (See Comments)    Migraines    "

## 2024-12-09 NOTE — Patient Instructions (Signed)
 Full PFT completed today ? ?

## 2025-01-10 ENCOUNTER — Ambulatory Visit: Admitting: Podiatry
